# Patient Record
Sex: Female | Born: 1945 | Race: White | Hispanic: No | Smoking: Current every day smoker
Health system: Southern US, Community
[De-identification: ages and names within clinical notes are randomized; demographics above are authoritative.]

## PROBLEM LIST (undated history)

## (undated) ENCOUNTER — Emergency Department (HOSPITAL_COMMUNITY): Admission: EM | Payer: Medicare Other | Source: Home / Self Care

## (undated) DIAGNOSIS — E785 Hyperlipidemia, unspecified: Secondary | ICD-10-CM

## (undated) DIAGNOSIS — I10 Essential (primary) hypertension: Secondary | ICD-10-CM

## (undated) DIAGNOSIS — Z8572 Personal history of non-Hodgkin lymphomas: Secondary | ICD-10-CM

## (undated) DIAGNOSIS — I2510 Atherosclerotic heart disease of native coronary artery without angina pectoris: Secondary | ICD-10-CM

## (undated) DIAGNOSIS — M1990 Unspecified osteoarthritis, unspecified site: Secondary | ICD-10-CM

## (undated) DIAGNOSIS — I513 Intracardiac thrombosis, not elsewhere classified: Secondary | ICD-10-CM

## (undated) DIAGNOSIS — I509 Heart failure, unspecified: Secondary | ICD-10-CM

## (undated) HISTORY — DX: Hyperlipidemia, unspecified: E78.5

---

## 2001-02-01 ENCOUNTER — Ambulatory Visit (HOSPITAL_COMMUNITY): Admission: RE | Admit: 2001-02-01 | Discharge: 2001-02-01 | Payer: Self-pay | Admitting: Internal Medicine

## 2001-07-08 ENCOUNTER — Ambulatory Visit (HOSPITAL_COMMUNITY): Admission: RE | Admit: 2001-07-08 | Discharge: 2001-07-08 | Payer: Self-pay

## 2002-01-19 ENCOUNTER — Encounter (HOSPITAL_COMMUNITY): Admission: RE | Admit: 2002-01-19 | Discharge: 2002-02-18 | Payer: Self-pay | Admitting: Rheumatology

## 2002-01-19 ENCOUNTER — Encounter: Payer: Self-pay | Admitting: Rheumatology

## 2002-03-23 ENCOUNTER — Encounter (HOSPITAL_COMMUNITY): Admission: RE | Admit: 2002-03-23 | Discharge: 2002-04-22 | Payer: Self-pay | Admitting: Rheumatology

## 2002-06-15 ENCOUNTER — Encounter (HOSPITAL_COMMUNITY): Admission: RE | Admit: 2002-06-15 | Discharge: 2002-07-15 | Payer: Self-pay | Admitting: Rheumatology

## 2002-07-26 ENCOUNTER — Encounter (HOSPITAL_COMMUNITY): Admission: RE | Admit: 2002-07-26 | Discharge: 2002-08-25 | Payer: Self-pay | Admitting: Rheumatology

## 2002-07-26 ENCOUNTER — Encounter: Payer: Self-pay | Admitting: Rheumatology

## 2002-08-01 ENCOUNTER — Encounter: Payer: Self-pay | Admitting: Rheumatology

## 2002-08-07 ENCOUNTER — Ambulatory Visit (HOSPITAL_COMMUNITY): Admission: RE | Admit: 2002-08-07 | Discharge: 2002-08-07 | Payer: Self-pay

## 2002-08-14 ENCOUNTER — Encounter: Payer: Self-pay | Admitting: Specialist

## 2002-08-14 ENCOUNTER — Encounter: Admission: RE | Admit: 2002-08-14 | Discharge: 2002-08-14 | Payer: Self-pay | Admitting: Specialist

## 2002-10-05 ENCOUNTER — Encounter (HOSPITAL_COMMUNITY): Admission: RE | Admit: 2002-10-05 | Discharge: 2002-11-04 | Payer: Self-pay | Admitting: Rheumatology

## 2002-10-06 ENCOUNTER — Encounter: Payer: Self-pay | Admitting: Specialist

## 2002-10-11 ENCOUNTER — Encounter: Payer: Self-pay | Admitting: Specialist

## 2002-10-11 ENCOUNTER — Inpatient Hospital Stay (HOSPITAL_COMMUNITY): Admission: RE | Admit: 2002-10-11 | Discharge: 2002-10-18 | Payer: Self-pay | Admitting: Specialist

## 2002-10-15 ENCOUNTER — Encounter: Payer: Self-pay | Admitting: Specialist

## 2003-03-01 ENCOUNTER — Ambulatory Visit (HOSPITAL_COMMUNITY): Admission: RE | Admit: 2003-03-01 | Discharge: 2003-03-02 | Payer: Self-pay | Admitting: Specialist

## 2003-12-03 ENCOUNTER — Ambulatory Visit: Payer: Self-pay | Admitting: Anesthesiology

## 2004-01-22 ENCOUNTER — Ambulatory Visit: Payer: Self-pay | Admitting: Anesthesiology

## 2004-01-29 ENCOUNTER — Ambulatory Visit: Payer: Self-pay | Admitting: Anesthesiology

## 2004-01-31 ENCOUNTER — Ambulatory Visit: Payer: Self-pay | Admitting: Cardiology

## 2004-02-19 ENCOUNTER — Ambulatory Visit (HOSPITAL_COMMUNITY): Admission: RE | Admit: 2004-02-19 | Discharge: 2004-02-19 | Payer: Self-pay

## 2004-03-13 ENCOUNTER — Ambulatory Visit: Payer: Self-pay | Admitting: Cardiology

## 2004-03-26 ENCOUNTER — Ambulatory Visit: Payer: Self-pay | Admitting: Cardiology

## 2004-03-27 ENCOUNTER — Ambulatory Visit: Payer: Self-pay | Admitting: Anesthesiology

## 2004-04-29 ENCOUNTER — Ambulatory Visit: Payer: Self-pay | Admitting: Anesthesiology

## 2004-05-27 ENCOUNTER — Ambulatory Visit: Payer: Self-pay | Admitting: Anesthesiology

## 2004-07-22 ENCOUNTER — Ambulatory Visit: Payer: Self-pay | Admitting: Anesthesiology

## 2004-07-23 ENCOUNTER — Ambulatory Visit: Payer: Self-pay | Admitting: Anesthesiology

## 2004-08-21 ENCOUNTER — Ambulatory Visit: Payer: Self-pay | Admitting: Anesthesiology

## 2004-10-03 ENCOUNTER — Encounter: Admission: RE | Admit: 2004-10-03 | Discharge: 2004-10-03 | Payer: Self-pay | Admitting: Specialist

## 2004-10-08 ENCOUNTER — Ambulatory Visit: Payer: Self-pay | Admitting: Anesthesiology

## 2005-01-15 ENCOUNTER — Ambulatory Visit: Payer: Self-pay | Admitting: Internal Medicine

## 2005-02-19 ENCOUNTER — Ambulatory Visit (HOSPITAL_COMMUNITY): Admission: RE | Admit: 2005-02-19 | Discharge: 2005-02-19 | Payer: Self-pay | Admitting: Pulmonary Disease

## 2005-03-03 ENCOUNTER — Ambulatory Visit: Payer: Self-pay | Admitting: Internal Medicine

## 2005-04-10 ENCOUNTER — Ambulatory Visit: Payer: Self-pay | Admitting: Cardiology

## 2005-04-16 ENCOUNTER — Encounter: Admission: RE | Admit: 2005-04-16 | Discharge: 2005-04-16 | Payer: Self-pay | Admitting: Specialist

## 2005-05-21 ENCOUNTER — Ambulatory Visit (HOSPITAL_COMMUNITY): Admission: RE | Admit: 2005-05-21 | Discharge: 2005-05-21 | Payer: Self-pay | Admitting: Pulmonary Disease

## 2005-06-25 ENCOUNTER — Ambulatory Visit: Payer: Self-pay | Admitting: Internal Medicine

## 2006-03-05 ENCOUNTER — Ambulatory Visit: Payer: Self-pay | Admitting: Internal Medicine

## 2006-03-09 ENCOUNTER — Ambulatory Visit (HOSPITAL_COMMUNITY): Admission: RE | Admit: 2006-03-09 | Discharge: 2006-03-09 | Payer: Self-pay

## 2007-03-11 ENCOUNTER — Ambulatory Visit (HOSPITAL_COMMUNITY): Admission: RE | Admit: 2007-03-11 | Discharge: 2007-03-11 | Payer: Self-pay | Admitting: Pulmonary Disease

## 2009-03-26 ENCOUNTER — Ambulatory Visit (HOSPITAL_COMMUNITY): Admission: RE | Admit: 2009-03-26 | Discharge: 2009-03-26 | Payer: Self-pay | Admitting: Gynecology

## 2009-12-07 ENCOUNTER — Emergency Department (HOSPITAL_BASED_OUTPATIENT_CLINIC_OR_DEPARTMENT_OTHER)
Admission: EM | Admit: 2009-12-07 | Discharge: 2009-12-08 | Payer: Self-pay | Source: Home / Self Care | Admitting: Emergency Medicine

## 2009-12-08 ENCOUNTER — Ambulatory Visit: Payer: Self-pay | Admitting: Diagnostic Radiology

## 2010-03-28 ENCOUNTER — Ambulatory Visit (HOSPITAL_COMMUNITY): Admission: RE | Admit: 2010-03-28 | Payer: Self-pay | Source: Home / Self Care | Admitting: Gynecology

## 2010-04-02 ENCOUNTER — Encounter: Payer: Self-pay | Admitting: Gynecology
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
## 2010-07-18 NOTE — Consult Note (Signed)
NAME:  IDONNA, HEEREN NO.:  [id number]   MEDICAL RECORD NO.:  [id number]                   PATIENT TYPE:   LOCATION:                                       FACILITY:   PHYSICIAN:  Aundra Dubin, M.D.            DATE OF BIRTH:   DATE OF CONSULTATION:  DATE OF DISCHARGE:                                   CONSULTATION   CHIEF COMPLAINT:  Right hip and neck pain.   HISTORY:  Ms. Sami reports that over the last several weeks, she has  started to hurt a great deal in the right upper outer hip area.  This hurts  with walking and it is also tender to lay on it at night.  She continues to  hurt in her neck.  She finds that one or two Vicodin a day helps this a  great deal.  She uses it more on the days that she works.  She also uses the  Celebrex which helps.  I reviewed her x-rays which show a significant  arthritis at the C5-6-7 areas.  Her weight is up eight pounds.  There are no  swollen joints, URIs, fever, cough, or shortness of breath.   Her daughter has recently been in a severe MVA and is hospitalized at [facility].  She is going down to visit her essentially daily.   MEDICATIONS:  1. Celebrex 200 mg daily.  2. Vicodin 5 mg b.i.d.  3. Lipitor 10 mg daily.  4. Allegra-D daily.  5. Prozac 20 mg daily.  6. Flexeril 10 mg h.s.  7. Antacids.   PHYSICAL EXAMINATION:  VITAL SIGNS:  Weight 180 pounds, blood pressure  160/90, respirations 16.  GENERAL:  No distress.  LUNGS:  Clear.  NECK:  Negative JVD.  HEART:  Regular, no murmur.  EXTREMITIES:  Lower extremities no edema.  MUSCULOSKELETAL:  Hands, wrists, elbows, and shoulders, good range of  motion.  The neck has stiffness and decreased range of motion, but was not  very tender with this movement.  Trigger points around the shoulder, neck,  occiput, anterior chest, and upper paraspinous muscles were quite tender.  The right trochanteric bursa was tender.  Movement of the hip which  was  replaced was nontender.  Knees, ankles, and feet had good range of motion  and showed no active arthritis.   PROCEDURE:  Right trochanteric bursa injection.   DESCRIPTION OF PROCEDURE:  The skin was cleaned with alcohol and cooled with  ethyl chloride.  Then, 40 mg of Depo-Medrol and 1 mL of 2% lidocaine was  injected.    ASSESSMENT AND PLAN:  1. Neck pain.  She is stable with use of the Vicodin as above.  She is using     this entirely appropriately.  She will also continue with the Celebrex to     help the neck and hip pain.  2. Right trochanteric bursitis.  This has been injected as above.  3. Fibromyalgia.  She continues to use the Flexeril which helps with her     sleep.  4. She will return in four months.                                               Aundra Dubin, M.D.    WWT/MEDQ  D:  06/15/2002  T:  06/15/2002  Job:  [id number]   cc:   Ramon Dredge L. Juanetta Gosling, M.D.  [address]  [place]  [place] [address]  Fax: [phone]

## 2010-07-18 NOTE — Op Note (Signed)
NAMEJAQUELYNE, [name]                         ACCOUNT NO.:  [id number]   MEDICAL RECORD NO.:  [id number]                   PATIENT TYPE:  INP   LOCATION:  5035                                 FACILITY:  [facility]   PHYSICIAN:  [name], M.D.                DATE OF BIRTH:  04/22/1945   DATE OF PROCEDURE:  10/11/2002  DATE OF DISCHARGE:                                 OPERATIVE REPORT   PREOPERATIVE DIAGNOSIS:  Spondylolisthesis, degenerative type, L4-5, with  severe lumbar spinal stenosis, lumbar degenerative disk disease, L3-4.   POSTOPERATIVE DIAGNOSIS:  Spondylolisthesis, degenerative type, L4-5, with  severe lumbar spinal stenosis, lumbar degenerative disk disease, L3-4.   PROCEDURES:  1. Central laminectomy, L4-5 level, with removal of the spinous process of     L4 and central portions of the lamina of L4.  2. Right-sided facetectomy, decompression of bilateral L4 and L5 nerve     roots.  3. Right-side transforaminal intervertebral disk fusion utilizing a     Brantigan 10 mm radiolucent T-LIF cage with autogenous bone graft as well     as Symphony allograft.  4. Posterolateral fusion, L3 to L5, two levels, utilizing instrumentation     with the Monarch pedicle screws and rods, 75 mm rods used.  5. Right iliac crest bone graft harvested through a separate fascia incision     augmented with allograft bone graft material and Symphony platelet-rich     material.   SURGEON:  [name], M.D.   ASSISTANT:  [name], P.A.   ANESTHESIA:  Burna Forts, M.D.   ESTIMATED BLOOD LOSS:  500 mL.   DRAINS:  Foley to straight drain, Hemovac x1.   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  She has been  experiencing significant pain into her back, radiation into her legs,  worsening since February of this year.  She has a history of previous  hemiarthroplasty of the  hip on the right side.  She relates that she has  been experiencing severe worsening discomfort with ambulation, pain into  both legs, right leg greater than left.  She underwent evaluation with  determination of grade 1 degenerative spondylolisthesis at L4-5 with severe  central stenosis, bilateral lateral recess stenosis that could affect the L5  nerve root, moderate joint arthrosis changes with degenerative disk disease  at the L3-4 level.  Mild arthritis changes, L2-3.  The patient underwent  attempts at conservative management and as she has persistent pain,  discomfort, and increasing impairment in ambulation, she is brought to  undergo a central laminectomy, decompression, fusion to restore alignment of  the spine, inclusion of the L3-4 level, as this is showing degeneration as  well.   INTRAOPERATIVE FINDINGS:  Moderately severe lumbar spinal stenosis affecting  the lateral recesses bilateral at  the L4-5 level, affecting the L4 and L5  nerve roots.  Degenerative disk disease, L3-4.   DESCRIPTION OF PROCEDURE:  After adequate general anesthesia, the patient in  a prone position, chest rolls, all pressure points well-padded, a Foley  catheter in place.  Standard preoperative antibiotics, standard prep from  the lower dorsal spine to the midsacral level with Duraprep solution, draped  in the usual manner.  Iodine Vi-Drape was used.  The incision at the  expected L5 level extending superiorly to the expected L3 and L2 levels,  through the skin and subcutaneous layers after infiltration with Marcaine  0.5% with 1:200,000 epinephrine.  Incision carried sharply to the lumbar  dorsal fascia.  Bleeders controlled using electrocautery on the dorsal  fascia, incised in the midline, and then brought over the spinous process of  the expected L5, L4, L3, and L2 levels.  Cobbs then used to elevate the  paralumbar muscles off the posterior elements of the spinous process of L5,  L4, L3, and L2,  and over the posterior aspect of the interlaminar space at  L2-3, L3-4, L4-5.  Clamps were then placed over the spinous process of L4 to  L3.  Intraoperative lateral radiograph demonstrated these to be the same  levels.  A McCullough retractor was inserted and exposure then carried out  over the posterior aspects of the facets at the L4-5 level and L3-4 levels,  resecting the facet capsule at these levels while exposing the transverse  process of L5 and L4.  Exposure then obtained extracapsular at the L2-3  level, exposing the transverse process of L3 and preserving the facet  capsules at the L2-3 level.  The lateral gutters then debrided and muscle  tissue that was remnants of the paralumbar muscles here in order to allow  for the exposure of the transverse processes of L3, L4, and L5.  This was  done bilaterally and the areas were packed, bleeders controlled using  electrocautery.  A viper type of retractor then inserted and a Leksell  rongeur then used to remove the spinous process of L4 centrally and thin the  central portions of the lamina posteriorly at the L4 level.  This was saved  for later bone graft purposes.   Three millimeter and 4 mm Kerrison used to resect the remaining portions of  the central portion of the lamina from the L4-5 level to the L3-4 level.  The lateral recesses then decompressed, the ligamentum flavum excised, and  the medial aspect of the facet right L4, left L4-5.  The entire facet on the  right side was resected.  The inferior articular process, the superior  medial aspect of the L5 superior articular process.   Central lamina resected and the left lateral recess decompressed with medial  facetectomy over about 40% of the left L4-5 facet.  The left L4 nerve root  was decompressed, removing the reflected portion of the ligamentum flavum  and the superior aspect of the superior articular process of L5, decompressing the left L4 nerve root, the right L4 nerve  root similarly  decompressed out lateral.  The L5 nerve roots bilaterally decompressed with  foraminotomy performed over each of these nerve roots.  The central  laminectomy continued superiorly with excision of the ligamentum flavum at  the L3-4 level.   With this, then the fusion procedure was begun with placement of pedicle  screws at the expected L5, L4, and L3 levels.  This was done after observing  the intraoperative  radiographs, determining the alignment of the pedicles at  each level, L5, L4, and L3, then carefully exposing the intersection of the  lateral portion of the superior articular process of L5 with the transverse  process of L5, placing a small hole in the lateral aspect at the base of the  superior articular process at its intersection with the transverse process,  then inserting a pedicle finder probe, a blunt probe, and then probing the  channel within the pedicle on the right side.  Similarly this was done on  the left side at the L5 level and then at the L4-5 level bilaterally and at  the L3-4 level bilaterally.  Screws were placed on the right side after  probing the channels using ball-tip probe, then tapping appropriately.  Screws 6.5 were used with lengths determined after applying the ball-tip  probe and ensuring no evidence of penetration of cortical bone within the  pedicle or through the pedicle at each segment.  Forty millimeter screws  were used at the L5 level and 45 mm screws at the L4 level, 45 mm screws at  the L3 level.   Tapping performed, decortication of the transverse process, bone graft that  had been harvested from both the right iliac crest and from the posterior  elements was then used to bone-graft the transverse processes and the screws  then placed.  Screws were similarly placed on the left side.  C-arm  fluoroscopy was used to ascertain correct position and alignment.  The [employer] soft tissue resistance was also measured at each  screw and measured  40 milliamps at each segment with the exception of the left L4, which  measured 36.   Rods 75 mm were then placed into each of the fasteners and the screws  following breakage of the locking mechanism and a temporary locking  mechanism using a fastener block breaker.  Seventy-five millimeter length  rods placed and then the caps applied to the fasteners at each level.  The  uppermost segment was to be posterolaterally fused without a T-LIF, was  locked into place by carefully fastening the rod and aligning it with the  fasteners in place and then performing tightening of the screw posteriorly  to 100 foot pounds.  The torque wrench was used for this and applied quite  nicely.  This was also done at the L4 level, basically locking into place  the L4 and L3 segments.  Distraction was then obtained between the L4 and L5  pedicle screws and the screw at L5 and locked into place to allow for distraction during the T-LIF portion of the procedure.  The right posterior  disk at the L4-5 level was then evaluated and examined using a D'Errico  retractor to retract the right lateral thecal sac and the right L4 nerve  root.  A 15 blade scalpel was used to excise a window of disk over the  posterior annulus on the right side about a centimeter in length,  approximately 4 mm in width.  A pituitary rongeur was used to debride the  disk space and loose annulus and nucleus pulposus material.  The end plates  were then decorticated using the curved curettes as well as regular  curettes, osteotomes, and the ring curettes.  This was debrided back to  bleeding end plate bone surfaces.  Loupe magnification was used throughout  the case.  The decortication performed and bone graft was harvested from the  right iliac crest in a  separate fashion, an incision subcutaneous to the  muscle and fascia layer on the right side to the right iliac crest, lifting  the skin appropriately, and this was a  rather deep tunnel to the iliac  crest.  An incision made over the iliac crest using electrocautery,  subperiosteal dissection carried medial and lateral.  Osteotome then used to  remove cortical and cancellous bone strips, then gouges were used to remove  inner table cancellous strip, enough for a two-layer fusion as was possible.  This was augmented with allograft material.  Following removal of bone  graft, Gelfoam thrombin-soaked was placed into the right posterior iliac  crest bone graft harvest site.  The graft site left open in case more graft  was necessary.  The expected cages were then sounded, an expected 10 mm cage  provided the best fit on C-arm fluoroscopic evaluation, and this was chosen.  The 10 mm T-LIF Brantigan cage, radiolucent, was packed with cancellous bone  graft material obtained from the right iliac crest, packed into good  position and alignment.  The T-LIF cage with the graft packed in place was  then impacted into place after first filling the bone graft in the  interspace posteriorly using a fair amount of Symphony combination of  autograft and allograft material as well as bone graft that was left over  and cancellous bone from the right iliac crest.  The cage was then impacted  in place and then tipped into a position in the coronal plane of the disk  space.  Care taken to ensure that bony debris was not loose within the canal  or into the foramen of the right L4 nerve root, and excess bone graft was  then removed posteriorly.  A hockey stick neuro probe could be passed down  both L4 neural foramina and L5 neural foramina.   The cap fasteners were then loosened at the L5 level and compression  obtained across this level and the screw then fastened to the rod using 100  foot pound torque.  Care was taken to ensure that the screw fastener caps  were adequately placed and in good position and alignment prior to this. With this, then the Symphony bone graft  material remaining, Symphony logs  were then placed posterolaterally, extending from the transverse process of  L3 and L4 into L5.  Decortication of the facets of the L4-5 level and L3-4  level on the left side was carried out, on the right side at the L3-4 level,  and these were bone-grafted using cancellous bone graft as well as allograft  material.  Irrigation was performed.  Bleeders over the right side of the  spinal canal on L4-5 were controlled using bipolar electrocautery as well as  Surgicel and Gelfoam.  The Cell Saver was used throughout the case, a total  of about 150 mL of blood was returned to the patient.  Bleeding was felt to  be adequately hemostased.  At this point the platelet-rich layer of plasma  was then carefully spread over the incision site following irrigation, and  irrigation of the right iliac crest bone graft harvest, and this too was  sprayed with the platelet-rich plasma solution.  A Hemovac drain placed in  the central incision as well as right iliac crest.  Right iliac crest bone  graft harvest site closed with a running stitch of #1 Vicryl.  Midline  incision closed by reapproximating the lumbodorsal fascia to the spinous  process of L3  superiorly and to L5 inferiorly and then reapproximating the  paralumbar muscle loosely in the midline using interrupted #1 Vicryl  sutures.  Then the dorsal fascia approximated with interrupted #1 Vicryl  suture, the deep subcu layers approximated with interrupted #1 and 0 Vicryl  sutures, more superficial layers with interrupted 2-0 Vicryl sutures, and  the skin closed with a running subcu stitch of 4-0 Vicryl.  Tincture of  Benzoin and Steri-Strips  applied.  Four by fours, ABD pad affixed to the skin with Hypafix tape.  The  patient was then returned to a supine position, reactivated, extubated, and  returned to the recovery room in satisfactory condition.  All instrument and  sponge counts were correct.                                                [name], M.D.    JEN/MEDQ  D:  10/11/2002  T:  10/12/2002  Job:  [id number]

## 2010-07-18 NOTE — Consult Note (Signed)
NAME:  [name], [name] NO.:  [id number]   MEDICAL RECORD NO.:  [id number]                   PATIENT TYPE:   LOCATION:                                       FACILITY:   PHYSICIAN:  Aundra Dubin, M.D.            DATE OF BIRTH:   DATE OF CONSULTATION:  03/23/2002  DATE OF DISCHARGE:                                   CONSULTATION   CHIEF COMPLAINT:  Neck pain, hip pain.   HISTORY OF PRESENT ILLNESS:  The patient returns with her multiple  difficulties for evaluation.  She has been going to physical therapy for her  neck and the heat and range of motion has helped some.  She is having some  pain across the top of her shoulder that seems to be new over the last week.  The cervical spine x-ray of January 19, 2002 showed considerable  degenerative spurring and disk space loss between C5-6 and C6-7.  There were  also other changes.  The thoracic spine showed mild degenerative-type  changes and no compression fractures.  She has used the Effexor but finds  that this is no better than the Prozac.  She did see Dr. Montez Morita concerning  her right hip replacement and pain she was having.  She did stop the  exercises she was doing for this area and the thigh pain has reduced.  She  had the hip x-rayed which showed no significant loosening or need for  surgery.  She is now on Celebrex and this has helped this area and her neck.   MEDICATIONS:  1. Effexor 75 mg daily.  2. Celebrex 200 mg daily.  3. Vicodin 5 mg b.i.d.  4. Xanax 0.5 mg b.i.d.  5. Toprol XL 25 mg daily.  6. Lipitor 10 mg daily.  7. Allegra-D daily.   PHYSICAL EXAMINATION:  VITAL SIGNS:  Weight 172 pounds, blood pressure  154/90, respirations 14.  GENERAL:  She appears well.  LUNGS:  Clear.  HEART:  Regular.  No murmur.  MUSCULOSKELETAL:  Hands wrists, elbows, shoulders have a good range of  motion.  There are no tendonitis signs to the left shoulder.  The neck  remains tender with range  of motion which is mildly limited  Trigger points  around the shoulder and neck have moderate tenderness.  Hips:  Good range of  motion.  Knees are cool and flex well to 130 degrees.  Feet are nontender.   ASSESSMENT AND PLAN:  1. Neck pain.  She will continue for one month further with physical     therapy.  She will then continue at home with exercises.  I have     continued her on the Vicodin 5/500 mg one b.i.d.  She is using this     appropriately.  2. Depression.  Since the Effexor has not helped, I am having her stop this     for three days  and she can return to the     Prozac.  3. Right hip pain.  This is better understood after she has been evaluated     by Dr. Montez Morita.   She will return in three months.                                               Aundra Dubin, M.D.    WWT/MEDQ  D:  03/23/2002  T:  03/24/2002  Job:  [id number]   cc:   Ramon Dredge L. Juanetta Gosling, M.D.  [address]  [place]  [place] [address]  Fax: [phone]

## 2010-07-18 NOTE — Op Note (Signed)
NAMELAKEENA, [name]                         ACCOUNT NO.:  [id number]   MEDICAL RECORD NO.:  [id number]                   PATIENT TYPE:  OIB   LOCATION:  2550                                 FACILITY:  [facility]   PHYSICIAN:  [name], M.D.                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   PROCEDURE:  Anterior cervical diskectomy and fusion at C5-6 and C6-7 with  right iliac crest bone graft harvested through a separate incision.  A Dupuy  6-hole locking plate with screws measuring 13 mm in length at C5 and C7  levels and 12 mm in length at the C6 level.   SURGEON:  [name], M.D.   ASSISTANT:  [name], P.A.-C.   ANESTHESIA:  GOT, Dr. Jean Rosenthal.   ESTIMATED BLOOD LOSS:  150 cc.   DRAINS:  A #10 French TLS drain left neck, Foley to straight drain.   BRIEF CLINICAL HISTORY:  The patient is a 65 year old female who has  undergone previous two-level lumbar decompression and fusion for problems of  spondylolisthesis, degenerative disk disease.  Following this, she has had  problems with increasing neck pain and radiation into her shoulders and  arms.  She has positive Hoffman sign and MRI of the cervical spine  demonstrating narrowed cervical canal at the C5-6 level of 6 mm and 7 mm at  the C6-7 level.  MRI study also suggests that there is low level cord edema  or myelomalacia present and this seems to be most significant at the C5-6  level.  Bilateral severe foraminal narrowing entrapment left side greater  than right at the C6-7 level.   INTRAOPERATIVE FINDINGS:  The patient was found to have ossification of  the  posterior longitudinal ligament within the very central portions of the  cervical canal effecting the size and diameter of the cervical canal.  Bilateral foraminal entrapment at the C6-7 level.  Following decompression,  the central portions of the canal appeared to be well decompressed as did  the foramen bilaterally at the C6-7 level and C5-6 levels.   DESCRIPTION OF PROCEDURE:  After adequate general anesthesia, the patient in  a beach chair position, the neck in slight cervical traction and slight  extension traction with the halter 5 pounds.  A bump under the right  buttock.  A standard prep anterior and left neck and right iliac crest with  Duraprep solution.  Draped in the usual manner.  An iodine Vi-Drape was  placed over the neck and right iliac crest.  Prior to Vi-Drape being placed  and prior to prep, the skin was marked in lines over the patient's skin  creases for the incision.  The incisions on the left neck two fingerbreadths  above the medial clavicle in line with the skin creases on this side.  Through the skin and subcutaneous layers using a #10 blade scalpel after  infiltration with Marcaine 0.5%, 1:200,000 in 5 cc.  The platysma layer then  incised in line with the skin incision.  The pretracheal fascial layers then  carefully spread using the Metzenbaum scissors.  The interval between the  trachea and esophagus medially and the carotid sheath laterally then  developed.  The omohyoid muscle identified, localized and divided with  Metzenbaum scissors.  [employer], then hand-held Cloward used to retract the  esophagus and trachea medially.  Exposing the anterior aspect of the  cervical spine and prevertebral fascial layer here.  Bipolar electrocautery  used to cauterize the prevertebral fascia along the medial border and longus  colli muscle and this was then teased across the midline using Scientist, forensic as well as Art therapist.  The expected C5-6 and C6-7 levels  were  then marked by placement of spinal needles leaving the spinal needle sheath  intact, inserting them about a centimeter into the disk space.  Intraoperative lateral radiograph demonstrated these to be the C5-6 and C6-7  levels.  The needle was just below each of those disk spaces and their large  osteophytes.  The medial border of the longus colli muscle was then  carefully developed using electrocautery as well as Art therapist.  Bose and  McCullough retractor were then inserted, the foot lay beneath the medial  border of the longus colli muscle.  High speed bur then used to carefully  remove the anterior lip osteophytes at both C5-6 and C6-7 down to the  anterior portion of the anterior cortex of the midportion of the vertebral  body at all levels of C5, C6 and C7.  A #15 blade scalpel then used to  incise the disk at C5-6 and C6-7 removing anterior annulus at C6-7.  A 14-mm  screw post inserted in the anterior aspect of the vertebral body of C6 and  C7.  Distraction obtained across the disk space.  High speed bur using a  cutting bur 3.5 mm in diameter was then used to remove the anterior lip  osteophytes and remove the end plates at the Z6-1 level along with the disk  intervening.  This was carried back to the posterior lip osteophytes and the  posterior aspect of the disk space using loupe magnification and headlight.  Operating room microscope and drape brought into the field.  Under direct  visualization with the microscope and the high speed bur was used to thin  the posterior lip osteophytes to less than a millimeter for resection.  A 1-  mm Kerrison was then introduced over the left side into the lateral recess  and then used to carefully resect the posterior lip osteophytes first  inferiorly caudal to the lip of the osteophyte breaking off the osteophytes  as they were removed and floating them away from the cord.  Once this was completed, then a posterior-inferior lip  osteophyte of C6 was then similarly  resected, resecting in an area just above the area of narrowed canal.  Bony  ligament that was attached to this area was resected using pituitary  rongeurs as well as 2-mm Kerrison to lift this off the anterior aspect of  the thecal  sac.  Following removal of this debris from the posterior aspect  of the disk space, the spinal canal was felt to be well decompressed  centrally.  One and 2-mm Kerrisons were then used to perform partial  resection of the uncovertebral joints and foraminotomy bilaterally of the C6-  7 level until the C7 nerve root was felt to be exiting without further  compression.  The height of the intervertebral disk space was measured and a  #8 Dupuy trial seemed to give the best fit, so the 8-mm dual oscillating  blade was used to make the initial cut on the right iliac crest.  Incision  on the right iliac crest about 2-3 inches posterior to the anterior superior  iliac spine roughly 2.5 to 3 inches in length through the skin and  subcutaneous layer directly down to the iliac crest was done  anterolaterally.  Electrocautery used to incise onto the crest  superolaterally and then subperiosteally dissection carried medial and  lateral.  Hand-held Cloward placed over the medial aspect of the crest and  [employer] lateral and the Casper dual oscillating saw was then used with an  8-mm blade to cut into the crest.  Cloward depth gauge was used to ascertain  the depth of the intervertebral disk space at 16 mm.  Initial cut of 8 mm,  this was then trimmed to a depth of approximately 12-14 mm.  The edges were  then carefully tapered to dimensions of the intervertebral disk space.  This  graft then placed over the disk space and appeared to be too small.  So a  second cut was then made on the right iliac crest using a 9-mm dual  oscillating blade with TPS system.  This cut across the base, again measured  for depth and measured 14 mm.  Carefully  tapered to dimensions of the  intervertebral disk space and then placed over the disk space.  Care taken  to ensure no soft tissue remaining within the disk space and this was then  impacted into place without difficulty.  Screw posts were removed from the  C7 vertebral body and then bone wax applied, leaving the screw post hole to  obtain hemostasis.  The 14-mm screw post then inserted at the C5 level and  distraction obtained here.  High speed bur then used to remove the anterior  lip osteophytes and both end plates over the inferior aspect of C5, superior  aspect of C6 back to the posterior lip osteophytes here.  The operating room  microscope brought into the field and under direct visualization the  posterior lip osteophytes were resected burring to an area just inferior and  superior to the lip osteophytes.  Then using 1-mm Kerrison entering into the  lateral recess and then beneath the posterior lip osteophytes resecting the bone over the posterior aspect of the vertebral body just caudal to the area  of most significant stenosis and cranial to this area as well.  Osteophytes  were then resected using pituitary rongeur off of the thecal sac.  Two-  millimeter and 1-mm Kerrison used to resect osteophyte posterior  longitudinal ligament remaining.  Foraminotomies performed over the right C6  and left C6 nerve roots using 1- and 2-mm Kerrisons until these nerve roots  were felt to be exiting without evidence of compression.  Irrigation was  performed.  Hemostasis using thrombin-soaked Gelfoam.  The height of the  intervertebral disk space measured at about 8 mm.  The 8-mm graft  cut  previously was then carefully tapered to dimensions of the intervertebral  disk space, inserted and impacted into place.  This gave an excellent fit.  Five-pound cervical halter traction was then released.   A high-speed bur used to remove anterior lip osteophytes at C5-6 and C6-7  and smoothed this area to  allow for acceptance of the cervical plate.  The  cervical plate itself was contoured with a little bit more lordosis than was  on the regular plate.  This was done carefully so that the plate would  adhere to the anterior aspect of the cervical spine from C5 to C7.  The  plate then placed in the midline and pinned into place superiorly and  inferiorly.  Drill holes were then placed at the C5 level, first on the  midline and then the left side.  It was  noted the plate was too lateral  left side so the drill holes and screws were felt to be too much to the left  side.  The screws were removed.  The plate then replaced to the right side  and centrally located.  The bone graft was placed to the screw holes that  had been made.  The plate then fastened into place using temporary fixation  pins.  The screw holes then placed at the C5 level using a 12-mm drill bit  and 13-mm screws were placed fixing the plate nicely at the C5.  The 12-mm  screws were placed to the C6 level, again obtaining excellent purchase.  Then at the C7 level drill holes were placed.  A 13-mm screw placed on the  right side.  On the left side, a 13-mm regular screw did not provide enough  purchase.  A 12-mm screw was placed on the left side obtaining excellent  purchase.  The plate appeared to be in excellent position and alignment.  Irrigation was performed.  Small areas of bleeding muscle longus coli on the  left lower side.  This was cauterized using bipolar electrocautery.  Esophagus examined demonstrating no abnormality.  Intraoperative radiograph  and lateral radiograph demonstrated plates and screws in good position and  alignment without retropulsion of graft material or of screws.  A 10-French  drain then placed in the depth of the incision exiting over the inferior  aspect of the patient's incision scar and sewn in place with a 4-0 nylon  stitch.  The platysmal layer reapproximated with interrupted 3-0  Vicryl sutures.  Deep subcutaneous layers with interrupted 3-0 Vicryl suture and  the skin closed with a running 4-0 subcuticular stitch.  The right iliac  crest bone graft harvest site irrigated with copious amounts of irrigant  solution.  Bone wax applied to the bleeding cancellous bone surfaces and  then Gelfoam.  The fascial layers over the abdomen and upper thigh then  reapproximated in the midline with interrupted #1 Vicryl sutures.  Deep  subcutaneous layers reapproximated with interrupted #1 and 0-Vicryl sutures.  More superficial layers with interrupted 2-0 Vicryl sutures.  Skin closed  with a running subcutaneous. stitch of 4-0 Vicryl.  Tincture of Benzoin and  Steri-Strips applied here.  The TLS drain was then charged.  The patient had  application of dressings, 4 x 4s fixed to the skin with Hypafix tape over  the right iliac crest and left neck.  The patient had application of  [place] collar.  Foley catheter was then placed and 300 cc of urine  were found to be present.  The catheter was left in place then at that time.  The patient was then reactivated, extubated, returned to her bed and the  recovery room in satisfactory condition.                                               [name], M.D.    Myra Rude  D:  03/01/2003  T:  03/01/2003  Job:  [id number]
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
## 2010-07-18 NOTE — Discharge Summary (Signed)
NAMEADDASYN, MCBREEN                         ACCOUNT NO.:  [id number]   MEDICAL RECORD NO.:  [id number]                   PATIENT TYPE:  INP   LOCATION:  5035                                 FACILITY:  [facility]   PHYSICIAN:  [name], P.A.              DATE OF BIRTH:  23-Jun-1945   DATE OF ADMISSION:  10/11/2002  DATE OF DISCHARGE:  10/18/2002                                 DISCHARGE SUMMARY   ADMISSION DIAGNOSES:  1. Spondylolisthesis, degenerative type, L4, with severe lumbar spinal     stenosis and lumbar degenerative disk disease at L3-4.  2. Fibromyalgia.  3. Anxiety and depression.  4. Hypertension.  5. Gastrointestinal reflux disease with occasional gastritis.  6. Chronic constipation.  7. Status post right hip hemiarthroplasty.  8. Recent diagnosis of hematuria being evaluated on admission by urologist.     No infection noted by evaluation.   DISCHARGE DIAGNOSES:  1. Spondylolisthesis, degenerative type, L4, with severe lumbar spinal     stenosis and lumbar degenerative disk disease at L3-4.  2. Fibromyalgia.  3. Anxiety and depression.  4. Hypertension.  5. Gastrointestinal reflux disease with occasional gastritis.  6. Chronic constipation.  7. Status post right hip hemiarthroplasty.  8. Recent diagnosis of hematuria being evaluated on admission by urologist.     No infection noted by evaluation.  9. Postoperative oversedation secondary to analgesics, resolved at     discharge.  10.      Post hemorrhagic anemia.  11.      Hypokalemia.  12.      Postoperative atelectasis, resolved at discharge.   PROCEDURES:  On October 11, 2002, the patient underwent:  1. Central laminectomy, L4-5, with removal of spinous process of L4 and     central portions of the lamina of L4.  2. Right-sided facetectomy and decompression of bilateral L4 and L5 nerve     roots.  3. Right-sided transforaminal intervertebral disk fusion utilizing Brantigan     cages with autogenous  bone graft, as well as Symphony allograft.  4. Posterolateral fusion, L3 to L5, utilizing instrumentation with Monarch     medical screws and rods.  5. Right iliac crest bone graft harvested through a separate fascial     incision augmented with allograft bone graft material and Symphony     platelet-rich material.   This was performed by Dr. Otelia Sergeant assisted by [name], P.A., under  general anesthesia.   CONSULTATIONS:  Ellwood Dense, M.D., of physical medicine and  rehabilitation.   BRIEF HISTORY:  The patient is a 65 year old white female with significant  low back pain with radiation into bilateral lower extremities for several  months.  She has worsening of her discomfort with ambulation and the pain is  in the bilateral lower extremities, right greater than left.  X-ray  evaluation determined a grade 1 degenerative spondylolisthesis at L4-5.  MRI  studies have demonstrated  severe stenosis at that level, as well as  bilateral lateral recess stenosis that effects the L5 nerve root.  Moderate  joint arthrosis changes and degenerative disk disease at the L3 level were  also noted with mild arthritic changes at the L2-3 level noted.  The patient  has failed conservative treatment and now has persistent pain requiring  narcotic analgesics.  She also is impaired with ambulation.  She is a Engineer, civil (consulting)  and works for [employer] and  is having difficulty maintaining the level of function necessary to do her  job.  It was felt that she would require surgical intervention and was  admitted for the procedure as stated above.   BRIEF HOSPITAL COURSE:  Postoperatively the patient had significant  discomfort and required PCA analgesics augmented with oral analgesics.  Over  a 12-hour period, she became somewhat oversedated.  She required  discontinuation of multiple medications and oral agents only were started.  Over the next couple of days, she  continued to be somewhat sedated and had  difficulty participating with physical therapy secondary to oversedation.  At that time, she was placed on Lovenox for DVT prophylaxis as she was not  progressing very well with her activity level.  Physical therapy and  occupational therapy did see her daily.  She was slow to begin activity and  was only able to tolerated bed-to-chair transfers initially.  Occupational  therapy assisted with ADLs, but the patient did require some assistance  until her sedation resolved.  A rehabilitation consult was obtained  secondary to the fact that the patient was making very slow progress.  She  was felt to be a suitable candidate if she did not make any progress once  her oversedation had resolved.  Fortunately with the assistance of changing  her medications from IV to p.o., as well as discontinuing the use of Xanax  and other sedating medications, she was able to begin to increase her  activity.  The patient was noted to have some elevated fevers as high as 102  degrees.  This was felt to be related to atelectasis and she responded well  to incentive spirometry, as well as coughing and deep breathing.  Once she  began being out of bed and ambulating, the patient was noted to have  lessening of these fevers.  She had some mild hypoxia with O2 saturations at  94% on room air, which resolved as well.  Chest x-ray and urinalysis were  both negative for any infectious process.  The patient was very slow to  progress with a bowel movement as she did have significant chronic  constipation.  She required enemas to have an adequate bowel movement.  The  patient was noted to have hypokalemia, which was treated with oral  supplementations.  Values did improve with this treatment.  Wound checks  were done daily and the wound was found to be healing well without erythema or edema.  Neurovascular and motor function of the lower extremities  remained intact throughout the  hospital stay.  The patient did require a  rolling walker for ambulation and was able to ambulate in the hallway with  the assistance of a physical therapist prior to discharge.  Eventually she  was independent with activities of daily living as well.  The patient was  voiding well.  She was noted to have perineal area of ulceration on the  sixth postoperative day.  This was examined and felt to  be an inflamed hair  follicle which had erupted and had some bleeding.  No infectious process was  noted.  This was treated with local wound care.  The patient did have a drop  in history and physical postoperatively, but did not require blood  transfusion.  On the seventh postoperative day, she was noted to be afebrile  with vital signs stable.  Her pain was well controlled with OxyContin and  Oxy-IR.  She had no nausea and was voiding well and also having bowel  movements.  It was felt that she was stable with transfer to her home with  arrangements made for full assistance by [facility].   PERTINENT LABORATORY VALUES:  CBC on admission with hemoglobin and  hematocrit 13.5 and 39.8, respectively.  The hemoglobin dropped to the  lowest value of 8.1 with a hematocrit of 23.0 and after transfusion returned  to 10.5 and 30.1 respectively.  Chemistry studies on admission were within  normal limits with the exception of total protein mildly decreased at 5.9.  Postoperative BMETs were monitored daily and the patient was noted to have  mild hypokalemia treated with oral supplementations which improved.  At  discharge, the potassium was noted to 3.3.  The patient had mildly elevated  glucoses while on IV fluids which normalized with discontinuation of the  fluids.  The calcium was low and at discharge noted to 8.1.  The urinalysis  on admission, repeat on October 13, 2002, and repeat on October 15, 2002, all  were negative for urinary tract infection.  Urine culture showed no growth.  There is  no chest x-ray or EKG on this chart for this dictation purpose.   PLAN:  The patient was discharged to her home.  She does have family that  will be with her 24 hours a day.  She will wear her brace when she is out of  bed.  She was taught by the physical therapist how to don and doff the brace  at bedside and tolerated this well during the hospital stay.  She will  continue to wear the brace anytime she is walking.  She will utilize a  rolling walker for ambulation.  [facility] will provide home  health physical therapy for her.  She will resume all of her home medication  and is given prescriptions for OxyContin 40 mg b.i.d., #60, and Oxy-IR one  every four to six hours as needed for breakthrough pain, #60.  She will also  continue to use over the counter stool softeners and laxatives as needed as  long as she is taking pain medication.  Her Lovenox was discontinued at discharge and she will not utilize aspirin at home.  Dressing change will be  done daily at home.  She will be allowed to shower.  She will follow up with  Dr. Otelia Sergeant in 10-14 days and has been advised to call the office to arrange  the appointment.  Should she have questions or concerns prior to her return  office visit, she has been encouraged to call.   CONDITION ON DISCHARGE:  Stable.                                                [name], P.A.    SMV/MEDQ  D:  11/16/2002  T:  11/18/2002  Job:  [id number]  

## 2010-08-19 ENCOUNTER — Ambulatory Visit (INDEPENDENT_AMBULATORY_CARE_PROVIDER_SITE_OTHER): Payer: Medicare Other | Admitting: Internal Medicine

## 2010-08-19 DIAGNOSIS — K5900 Constipation, unspecified: Secondary | ICD-10-CM

## 2011-05-27 ENCOUNTER — Ambulatory Visit: Payer: Medicare Other | Attending: Physical Medicine and Rehabilitation | Admitting: Rehabilitation

## 2011-05-27 DIAGNOSIS — M6281 Muscle weakness (generalized): Secondary | ICD-10-CM | POA: Insufficient documentation

## 2011-05-27 DIAGNOSIS — M25519 Pain in unspecified shoulder: Secondary | ICD-10-CM | POA: Insufficient documentation

## 2011-05-27 DIAGNOSIS — IMO0001 Reserved for inherently not codable concepts without codable children: Secondary | ICD-10-CM | POA: Insufficient documentation

## 2011-06-09 ENCOUNTER — Ambulatory Visit: Payer: Medicare Other | Admitting: Physical Therapy

## 2011-06-09 ENCOUNTER — Ambulatory Visit: Payer: Medicare Other | Attending: Physical Medicine and Rehabilitation | Admitting: Physical Therapy

## 2011-06-09 DIAGNOSIS — M6281 Muscle weakness (generalized): Secondary | ICD-10-CM | POA: Insufficient documentation

## 2011-06-09 DIAGNOSIS — IMO0001 Reserved for inherently not codable concepts without codable children: Secondary | ICD-10-CM | POA: Insufficient documentation

## 2011-06-09 DIAGNOSIS — M25519 Pain in unspecified shoulder: Secondary | ICD-10-CM | POA: Insufficient documentation

## 2011-08-05 ENCOUNTER — Encounter (INDEPENDENT_AMBULATORY_CARE_PROVIDER_SITE_OTHER): Payer: Self-pay | Admitting: *Deleted

## 2011-08-17 ENCOUNTER — Ambulatory Visit (INDEPENDENT_AMBULATORY_CARE_PROVIDER_SITE_OTHER): Payer: MEDICARE | Admitting: Internal Medicine

## 2014-02-05 ENCOUNTER — Encounter (INDEPENDENT_AMBULATORY_CARE_PROVIDER_SITE_OTHER): Payer: Self-pay | Admitting: *Deleted

## 2014-08-21 DIAGNOSIS — C8205 Follicular lymphoma grade I, lymph nodes of inguinal region and lower limb: Secondary | ICD-10-CM | POA: Insufficient documentation

## 2014-12-18 DIAGNOSIS — R059 Cough, unspecified: Secondary | ICD-10-CM | POA: Insufficient documentation

## 2015-04-30 DIAGNOSIS — F17200 Nicotine dependence, unspecified, uncomplicated: Secondary | ICD-10-CM | POA: Insufficient documentation

## 2015-04-30 DIAGNOSIS — I10 Essential (primary) hypertension: Secondary | ICD-10-CM | POA: Insufficient documentation

## 2015-04-30 DIAGNOSIS — R911 Solitary pulmonary nodule: Secondary | ICD-10-CM | POA: Insufficient documentation

## 2015-04-30 DIAGNOSIS — M4800 Spinal stenosis, site unspecified: Secondary | ICD-10-CM | POA: Insufficient documentation

## 2015-04-30 DIAGNOSIS — I6523 Occlusion and stenosis of bilateral carotid arteries: Secondary | ICD-10-CM | POA: Insufficient documentation

## 2015-04-30 DIAGNOSIS — E782 Mixed hyperlipidemia: Secondary | ICD-10-CM | POA: Insufficient documentation

## 2015-04-30 DIAGNOSIS — M159 Polyosteoarthritis, unspecified: Secondary | ICD-10-CM | POA: Insufficient documentation

## 2015-04-30 DIAGNOSIS — E785 Hyperlipidemia, unspecified: Secondary | ICD-10-CM | POA: Insufficient documentation

## 2015-04-30 DIAGNOSIS — F3341 Major depressive disorder, recurrent, in partial remission: Secondary | ICD-10-CM | POA: Insufficient documentation

## 2015-04-30 DIAGNOSIS — I25810 Atherosclerosis of coronary artery bypass graft(s) without angina pectoris: Secondary | ICD-10-CM | POA: Insufficient documentation

## 2015-04-30 DIAGNOSIS — F411 Generalized anxiety disorder: Secondary | ICD-10-CM | POA: Insufficient documentation

## 2016-05-07 ENCOUNTER — Ambulatory Visit (INDEPENDENT_AMBULATORY_CARE_PROVIDER_SITE_OTHER): Payer: Self-pay | Admitting: Specialist

## 2017-05-10 DIAGNOSIS — G894 Chronic pain syndrome: Secondary | ICD-10-CM

## 2017-05-10 DIAGNOSIS — M5136 Other intervertebral disc degeneration, lumbar region: Secondary | ICD-10-CM | POA: Insufficient documentation

## 2017-05-10 DIAGNOSIS — Z79891 Long term (current) use of opiate analgesic: Secondary | ICD-10-CM | POA: Insufficient documentation

## 2017-05-10 HISTORY — DX: Chronic pain syndrome: G89.4

## 2017-07-21 DIAGNOSIS — M1712 Unilateral primary osteoarthritis, left knee: Secondary | ICD-10-CM | POA: Insufficient documentation

## 2018-08-24 DIAGNOSIS — R4182 Altered mental status, unspecified: Secondary | ICD-10-CM | POA: Insufficient documentation

## 2018-12-21 DIAGNOSIS — R7303 Prediabetes: Secondary | ICD-10-CM | POA: Insufficient documentation

## 2018-12-21 DIAGNOSIS — E663 Overweight: Secondary | ICD-10-CM | POA: Insufficient documentation

## 2019-02-01 DIAGNOSIS — T402X5A Adverse effect of other opioids, initial encounter: Secondary | ICD-10-CM | POA: Insufficient documentation

## 2019-02-01 DIAGNOSIS — K5903 Drug induced constipation: Secondary | ICD-10-CM | POA: Insufficient documentation

## 2019-02-01 DIAGNOSIS — R195 Other fecal abnormalities: Secondary | ICD-10-CM | POA: Insufficient documentation

## 2019-02-09 DIAGNOSIS — K5909 Other constipation: Secondary | ICD-10-CM | POA: Insufficient documentation

## 2020-09-07 DIAGNOSIS — M961 Postlaminectomy syndrome, not elsewhere classified: Secondary | ICD-10-CM | POA: Insufficient documentation

## 2020-11-13 ENCOUNTER — Ambulatory Visit (INDEPENDENT_AMBULATORY_CARE_PROVIDER_SITE_OTHER): Payer: Medicare Other

## 2020-11-13 ENCOUNTER — Ambulatory Visit: Payer: Self-pay

## 2020-11-13 ENCOUNTER — Ambulatory Visit (INDEPENDENT_AMBULATORY_CARE_PROVIDER_SITE_OTHER): Payer: Medicare Other | Admitting: Specialist

## 2020-11-13 ENCOUNTER — Encounter: Payer: Self-pay | Admitting: Specialist

## 2020-11-13 ENCOUNTER — Other Ambulatory Visit: Payer: Self-pay

## 2020-11-13 VITALS — BP 177/78 | HR 52 | Ht 63.0 in | Wt 156.0 lb

## 2020-11-13 DIAGNOSIS — M542 Cervicalgia: Secondary | ICD-10-CM | POA: Diagnosis not present

## 2020-11-13 DIAGNOSIS — R29818 Other symptoms and signs involving the nervous system: Secondary | ICD-10-CM

## 2020-11-13 DIAGNOSIS — M545 Low back pain, unspecified: Secondary | ICD-10-CM

## 2020-11-13 DIAGNOSIS — Z981 Arthrodesis status: Secondary | ICD-10-CM

## 2020-11-13 DIAGNOSIS — M4316 Spondylolisthesis, lumbar region: Secondary | ICD-10-CM

## 2020-11-13 DIAGNOSIS — M4312 Spondylolisthesis, cervical region: Secondary | ICD-10-CM

## 2020-11-13 NOTE — Progress Notes (Signed)
Office Visit Note   Patient: [name]           Date of Birth: [birth year]           MRN: [id number] Visit Date: 11/13/2020              Requested by: Sinda Du, MD No address on file PCP: Sinda Du, MD   Assessment & Plan: Visit Diagnoses:  1. Low back pain, unspecified back pain laterality, unspecified chronicity, unspecified whether sciatica present   2. Cervicalgia   3. Spondylolisthesis, lumbar region   4. History of lumbar spinal fusion   5. History of fusion of cervical spine   6. Positive Lhermitte's sign   7. Acquired spondylolisthesis of cervical vertebra     Plan: Avoid overhead lifting and overhead use of the arms. Do not lift greater than 5-10 lbs. Adjust head rest in vehicle to prevent hyperextension if rear ended. Take extra precautions to avoid falling. Avoid bending, stooping and avoid lifting weights greater than 10 lbs. Avoid prolong standing and walking. Avoid frequent bending and stooping  No lifting greater than 10 lbs. May use ice or moist heat for pain. Weight loss is of benefit. Handicap license is approved. MRI of the lumbar spine to assess for spinal stenosis associated with the spondylolisthesis L5-S1 below the fusion at L3-L5. MRI of  the cervical spine to assess for stenosis at the areas of anterolisthesis at C3-4, C4-5 and C7-T1.    Follow-Up Instructions: Return in about 3 weeks (around 12/04/2020).   Orders:  Orders Placed This Encounter  Procedures   XR Lumbar Spine 2-3 Views   XR Cervical Spine 2 or 3 views   No orders of the defined types were placed in this encounter.     Procedures: No procedures performed   Clinical Data: No additional findings.   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  She has been receiving intermittant ESIs from Dr. Nelva Bush at [facility]. She is having increasing pain into the right upper buttock and transverse at the lumbosacral junction. Pain is made greater with bending and stooping and she is having difficulty with prolong standing in one place and with walking distances. Pain radiates down the backs of the thighs and calves. She is taking Oxycodone IR 30 mg up to  Qid, using voltaren gel. Complains of pain radiation between her shoulder blades with extension of the neck. Previous cervical fusion C5-C7 in 2004. No bowel or bladder dysfunction. Can not grocery shop due to pain and difficulty walking a distance. Tends to stand upright and tries to maintain her posture but fatigues early and has to find a place to sit. Sitting too long causes pain.    Review of Systems  Constitutional: Negative.   HENT: Negative.    Eyes: Negative.   Respiratory: Negative.    Cardiovascular: Negative.   Gastrointestinal: Negative.   Endocrine: Negative.   Genitourinary: Negative.   Musculoskeletal: Negative.   Skin: Negative.   Allergic/Immunologic: Negative.   Neurological: Negative.   Hematological: Negative.   Psychiatric/Behavioral: Negative.      Objective: Vital Signs: BP (!) 177/78 (BP Location: Left Arm, Patient Position: Sitting)   Pulse (!) 52   Ht '5\' 3"'$  (1.6 m)   Wt  156 lb (70.8 kg)   BMI 27.63 kg/m   Physical Exam Constitutional:      Appearance: She is well-developed.  HENT:     Head: Normocephalic and atraumatic.  Eyes:     Pupils: Pupils are equal, round, and reactive to light.  Pulmonary:     Effort: Pulmonary effort is normal.     Breath sounds: Normal breath sounds.  Abdominal:     General: Bowel sounds are normal.     Palpations: Abdomen is soft.  Musculoskeletal:     Cervical back: Normal range of motion and neck supple.     Lumbar back: Negative right straight leg raise test and negative left straight leg raise test.  Skin:     General: Skin is warm and dry.  Neurological:     Mental Status: She is alert and oriented to person, place, and time.  Psychiatric:        Behavior: Behavior normal.        Thought Content: Thought content normal.        Judgment: Judgment normal.   Back Exam   Tenderness  The patient is experiencing tenderness in the lumbar.  Range of Motion  Extension:  abnormal  Flexion:  normal  Lateral bend right:  abnormal  Lateral bend left:  abnormal  Rotation right:  abnormal  Rotation left:  abnormal   Muscle Strength  Right Quadriceps:  5/5  Left Quadriceps:  5/5  Right Hamstrings:  5/5  Left Hamstrings:  5/5   Tests  Straight leg raise right: negative Straight leg raise left: negative  Reflexes  Patellar:  2/4 Achilles:  0/4 Biceps:  2/4 Babinski's sign: normal   Other  Toe walk: abnormal Heel walk: abnormal Sensation: normal Gait: abnormal   Comments:  Decreased ROM Cervical spine with about 40% loss of motion in extension and lateral bending and rotation. Tender posterior C6-T1. UE motor is intact with atrophy of the thumb thenar eminence bilateral. Finger abduction is weak. Finger extension is normal, biceps and triceps strength is intact.  Right EHL is 4/5 Left EHL is 5/5 Knee extension and hip flexion is normal as is foot DF.     Specialty Comments:  No specialty comments available.  Imaging: XR Cervical Spine 2 or 3 views  Result Date: 11/13/2020 AP and lateral flexion and extension radiographs of the cervical spine demonstrate plate and Screws fixing the C5 to C7 level with solid fusions there is degenerative disc narrowing C3-4, C4-5 and C6-7 with Anterolisthesis minimal C3-4 and C4-5 and more significant at the C7-T1 level. Spondylosis Of the facets from C2 to C5. Cervical lordosis is maintained in the upper cervical spine. There Is some kyphosis of the upper thoracic spine.     PMFS History: There are no problems to display for this patient.  History  reviewed. No pertinent past medical history.  History reviewed. No pertinent family history.  History reviewed. No pertinent surgical history. Social History   Occupational History   Not on file  Tobacco Use   Smoking status: Not on file   Smokeless tobacco: Not on file  Substance and Sexual Activity   Alcohol use: Not on file   Drug use: Not on file   Sexual activity: Not on file

## 2020-11-13 NOTE — Patient Instructions (Signed)
Plan: Avoid overhead lifting and overhead use of the arms. Do not lift greater than 5-10 lbs. Adjust head rest in vehicle to prevent hyperextension if rear ended. Take extra precautions to avoid falling. Avoid bending, stooping and avoid lifting weights greater than 10 lbs. Avoid prolong standing and walking. Avoid frequent bending and stooping  No lifting greater than 10 lbs. May use ice or moist heat for pain. Weight loss is of benefit. Handicap license is approved. MRI of the lumbar spine to assess for spinal stenosis associated with the spondylolisthesis L5-S1 below the fusion at L3-L5. MRI of  the cervical spine to assess for stenosis at the areas of anterolisthesis at C3-4, C4-5 and C7-T1.

## 2020-11-18 ENCOUNTER — Encounter: Payer: Self-pay | Admitting: Specialist

## 2020-11-19 ENCOUNTER — Telehealth: Payer: Self-pay | Admitting: Specialist

## 2020-11-19 NOTE — Telephone Encounter (Signed)
Pt called requesting a call from [place]. She has medical questions. Please call pt at [phone].

## 2020-11-19 NOTE — Telephone Encounter (Signed)
I called there was no answer. I will try again later

## 2020-11-20 NOTE — Telephone Encounter (Signed)
Lmom fo rpt to call back amd leave a detailed message

## 2020-11-26 ENCOUNTER — Other Ambulatory Visit: Payer: Self-pay | Admitting: Specialist

## 2020-11-26 DIAGNOSIS — M5412 Radiculopathy, cervical region: Secondary | ICD-10-CM

## 2020-11-26 DIAGNOSIS — M542 Cervicalgia: Secondary | ICD-10-CM

## 2020-11-26 DIAGNOSIS — M4807 Spinal stenosis, lumbosacral region: Secondary | ICD-10-CM

## 2020-11-26 DIAGNOSIS — Z981 Arthrodesis status: Secondary | ICD-10-CM

## 2020-11-26 NOTE — Telephone Encounter (Signed)
Pt left vm stating she has not heard anything about getting scheduled for Mri of Lumbar and cervical spine, no order was placed, I placed the order after looking in ov notes where it was mentioned of ordering both. I tried calling pt back to inform her that orders were placed, no vm was set up.

## 2020-11-26 NOTE — Telephone Encounter (Signed)
Pt called wanting to make sure those referrals were sent to [facility] in [place] since its closer to [place]. Pt would like a CB as confirmation.   [phone]

## 2020-11-26 NOTE — Telephone Encounter (Signed)
Called pt back and informed her orders has been placed and sent to [facility]

## 2020-12-05 DIAGNOSIS — R52 Pain, unspecified: Secondary | ICD-10-CM | POA: Insufficient documentation

## 2020-12-12 ENCOUNTER — Other Ambulatory Visit (HOSPITAL_COMMUNITY): Payer: Medicare Other

## 2020-12-12 ENCOUNTER — Ambulatory Visit (HOSPITAL_COMMUNITY)
Admission: RE | Admit: 2020-12-12 | Discharge: 2020-12-12 | Disposition: A | Discharge status: Home / Self Care | Payer: Medicare Other | Source: Ambulatory Visit | Attending: Specialist | Admitting: Specialist

## 2020-12-12 ENCOUNTER — Other Ambulatory Visit: Payer: Self-pay

## 2020-12-12 DIAGNOSIS — M4807 Spinal stenosis, lumbosacral region: Secondary | ICD-10-CM

## 2020-12-12 DIAGNOSIS — Z981 Arthrodesis status: Secondary | ICD-10-CM | POA: Diagnosis present

## 2020-12-12 DIAGNOSIS — M5412 Radiculopathy, cervical region: Secondary | ICD-10-CM | POA: Diagnosis present

## 2020-12-12 DIAGNOSIS — M542 Cervicalgia: Secondary | ICD-10-CM | POA: Insufficient documentation

## 2020-12-16 ENCOUNTER — Encounter: Payer: Self-pay | Admitting: Specialist

## 2020-12-16 ENCOUNTER — Ambulatory Visit (INDEPENDENT_AMBULATORY_CARE_PROVIDER_SITE_OTHER): Payer: Medicare Other | Admitting: Specialist

## 2020-12-16 ENCOUNTER — Other Ambulatory Visit: Payer: Self-pay

## 2020-12-16 VITALS — BP 107/65 | HR 52 | Ht 63.0 in | Wt 156.0 lb

## 2020-12-16 DIAGNOSIS — M542 Cervicalgia: Secondary | ICD-10-CM

## 2020-12-16 DIAGNOSIS — M48062 Spinal stenosis, lumbar region with neurogenic claudication: Secondary | ICD-10-CM

## 2020-12-16 DIAGNOSIS — R29818 Other symptoms and signs involving the nervous system: Secondary | ICD-10-CM | POA: Diagnosis not present

## 2020-12-16 DIAGNOSIS — M4312 Spondylolisthesis, cervical region: Secondary | ICD-10-CM

## 2020-12-16 DIAGNOSIS — M4802 Spinal stenosis, cervical region: Secondary | ICD-10-CM

## 2020-12-16 DIAGNOSIS — Z981 Arthrodesis status: Secondary | ICD-10-CM | POA: Diagnosis not present

## 2020-12-16 NOTE — Patient Instructions (Signed)
Avoid bending, stooping and avoid lifting weights greater than 10 lbs. Avoid prolong standing and walking. Order for a new walker with wheels. Surgery scheduling secretary Kandice Hams, will call you in the next week to schedule for surgery.  Surgery recommended is a one level lumbar bilateral partial hemilaminectomy L2-3 for spinal stenosis above the L3-L5 fusion this would be done with the operating room microscope. Continue with your regular medications for pain . Risk of surgery includes risk of infection 1 in 200 patients, bleeding 1/2% chance you would need a transfusion.   Risk to the nerves is one in 10,000. Expect improved walking and standing tolerance. Expect relief of leg pain but numbness may persist depending on the length and degree of pressure that has been present.

## 2020-12-16 NOTE — Progress Notes (Signed)
Office Visit Note   Patient: [name]           Date of Birth: 1945-09-04           MRN: [id number] Visit Date: 12/16/2020              Requested by: Sinda Du, MD No address on file PCP: Sinda Du, MD   Assessment & Plan: Visit Diagnoses:  1. History of fusion of cervical spine   2. Acquired spondylolisthesis of cervical vertebra   3. Positive Lhermitte's sign   4. Cervicalgia   5. History of lumbar spinal fusion   6. Spinal stenosis of lumbar region with neurogenic claudication   7. Spinal stenosis of cervical region   8. Status post cervical spinal fusion     Plan: Avoid bending, stooping and avoid lifting weights greater than 10 lbs. Avoid prolong standing and walking. Order for a new walker with wheels. Surgery scheduling secretary Kandice Hams, will call you in the next week to schedule for surgery.  Surgery recommended is a one level lumbar bilateral partial hemilaminectomy L2-3 for spinal stenosis above the L3-L5 fusion this would be done with the operating room microscope. Continue with your regular medications for pain . Risk of surgery includes risk of infection 1 in 100 patients, bleeding 1/2% chance you would need a transfusion.   Risk to the nerves is one in 10,000. Expect improved walking and standing tolerance. Expect relief of leg pain but numbness may persist depending on the length and degree of pressure that has been present.  Follow-Up Instructions: Return in about 4 weeks (around 01/13/2021).   Orders:  No orders of the defined types were placed in this encounter.  No orders of the defined types were placed in this encounter.     Procedures: No procedures performed   Clinical Data: Findings:  Narrative & Impression CLINICAL DATA:  Low back pain, left leg pain and weakness   EXAM: MRI LUMBAR SPINE WITHOUT CONTRAST   TECHNIQUE: Multiplanar, multisequence MR imaging of the lumbar spine was performed. No intravenous  contrast was administered.   COMPARISON:  01/29/2004, correlation is also made with lumbar spine radiographs 11/13/2020   FINDINGS: Segmentation:  Standard.   Alignment: Levocurvature of the lumbar spine, centered on L3-L4. Stepwise trace retrolisthesis of T12 on L1 and L1 on L2. And grade 1 anterolisthesis L5 on S1, which is new since 2005 and present on the recent radiographs.   Vertebrae: Status post L3-L5 posterior fusion, with interbody disc spacer and posterior decompression at L4-L5. T1 and T2 hyperintense signal at the endplates adjacent to the T12-L1 disc space, likely Modic type 2 endplate degenerative changes. No acute fracture, evidence of discitis, or suspicious bone lesion.   Conus medullaris and cauda equina: Conus extends to the L2 level. Conus and cauda equina appear normal.   Paraspinal and other soft tissues: Fatty atrophy of the inferior paraspinous muscles. Otherwise negative.   Disc levels:   T11-T12: Seen only on the sagittal images. Minimal disc bulge. No spinal canal stenosis or neural foraminal narrowing.   T12-L1: Trace retrolisthesis. Left eccentric broad-based disc bulge. Mild facet arthropathy. No spinal canal stenosis. Moderate left neural foraminal narrowing.   L1-L2: Trace retrolisthesis. Broad-based disc bulge. Mild facet arthropathy. No spinal canal stenosis. Moderate to severe left and mild right neural foraminal narrowing.   L2-L3: Broad-based disc bulge. Moderate to severe facet arthropathy with ligamentum flavum hypertrophy. Effacement of the bilateral lateral recesses. Severe spinal canal stenosis,  which is new from the prior exam. Severe left and moderate right neural foraminal narrowing is also new from the prior exam.   L3-L4: Status post fusion. No significant disc bulge. Osseous fusion across the facets. No spinal canal stenosis or neural foraminal narrowing.   L4-L5: Status post fusion and decompression. No spinal  canal stenosis or neural foraminal narrowing.   L5-S1: Grade 1 anterolisthesis with disc unroofing and broad-based disc bulge. Severe bilateral facet arthropathy. No spinal canal stenosis. Mild bilateral neural foraminal narrowing.   IMPRESSION: 1. 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 Effacement of the lateral recesses at this level may affect the descending L3 nerves. 2. Multilevel facet arthropathy, which causes moderate left neural foraminal narrowing at T12-L1, moderate to severe left and mild right neural foraminal narrowing at L1-L2, and mild bilateral neural foraminal narrowing at L5-S1.     Electronically Signed   By: Merilyn Baba M.D.   On: 12/14/2020 17:49   Narrative & Impression CLINICAL DATA:  Bilateral scapular pain   EXAM: MRI CERVICAL SPINE WITHOUT CONTRAST   TECHNIQUE: Multiplanar, multisequence MR imaging of the cervical spine was performed. No intravenous contrast was administered.   COMPARISON:  No prior MRI of the cervical spine. Correlation is made with cervical spine radiographs 11/13/2020 and 04/16/2005 CT cervical spine   FINDINGS: Alignment: 3 mm anterolisthesis C7 on T1. mild exaggeration of the normal cervical lordosis.   Vertebrae: Status post ACDF C5-C7, with solid osseous fusion across C5-C6. Osseous fusion is not definitively seen across C6-C7. No acute fracture, evidence of discitis, or suspicious lesion.   Cord: Focal increased T2 signal within the spinal cord at C6 (series 15, image 21 and series 12, image 9). No evidence of cord expansion at this level. The spinal cord is otherwise normal in signal and morphology.   Posterior Fossa, vertebral arteries, paraspinal tissues: Negative.   Disc levels:   C2-C3: No significant disc bulge.  Ligamentum flavum hypertrophy. Left greater than right facet and uncovertebral hypertrophy. No spinal canal stenosis. Mild left neural foraminal narrowing.   C3-C4: No significant disc bulge. Ligamentum flavum hypertrophy. Left greater than right facet and uncovertebral hypertrophy. Severe left and moderate right neural foraminal narrowing. No spinal canal stenosis.   C4-C5: Broad-based disc bulge. Ligamentum flavum hypertrophy. Facet and uncovertebral hypertrophy. Moderate to severe spinal canal stenosis. Moderate to severe bilateral neural foraminal narrowing.   C5-C6: Status post fusion. Osseous hypertrophy at the posterior aspect of C6, which with ligamentum flavum hypertrophy, causes mild spinal canal stenosis. No neural foraminal narrowing.   C6-C7: No spinal canal stenosis. Left-greater-than-right uncovertebral hypertrophy. Mild left neural foraminal narrowing.   C7-T1: Mild anterolisthesis with disc unroofing. No spinal canal stenosis. No neural foraminal narrowing.   IMPRESSION: 1. Status post C5-C7 ACDF, with solid osseous fusion across C5-C6 but not definitively across C6-C7. There is osseous hypertrophy of the posterior aspect of C6, adjacent to a small focus of increased T2 signal within the central spinal cord, likely chronic myelomalacia. 2. C4-C5 moderate to severe spinal canal stenosis and moderate to severe bilateral neural foraminal narrowing. 3. C3-C4 severe left and moderate right neural foraminal narrowing.     Electronically Signed   By: Merilyn Baba M.D.   On: 12/14/2020 17:31        Subjective: Chief Complaint  Patient presents with   Neck -  Follow-up, Pain    MRI cervical spine review   Lower Back - Follow-up, Pain    MRI lumbar review    75 year old female with history of L3-4 and L4-5 fusion in the past now with increasing back and right leg pain. Pain is worse into the right posterior and lateral thigh. Underwent recent MRI of the  cervical and lumbar spine. Returns today. Saw Dr. Nelva Bush recently. No bowel or bladder other than constipation.   Review of Systems  Constitutional: Negative.   HENT: Negative.    Eyes: Negative.   Respiratory: Negative.    Cardiovascular: Negative.   Gastrointestinal: Negative.   Endocrine: Negative.   Genitourinary: Negative.   Musculoskeletal: Negative.   Skin: Negative.   Allergic/Immunologic: Negative.   Neurological: Negative.   Hematological: Negative.   Psychiatric/Behavioral: Negative.      Objective: Vital Signs: BP 107/65   Pulse (!) 52   Ht 5\' 3"  (1.6 m)   Wt 156 lb (70.8 kg)   BMI 27.63 kg/m   Physical Exam Constitutional:      Appearance: She is well-developed.  HENT:     Head: Normocephalic and atraumatic.  Eyes:     Pupils: Pupils are equal, round, and reactive to light.  Pulmonary:     Effort: Pulmonary effort is normal.     Breath sounds: Normal breath sounds.  Abdominal:     General: Bowel sounds are normal.     Palpations: Abdomen is soft.  Musculoskeletal:     Cervical back: Normal range of motion and neck supple.     Lumbar back: Negative right straight leg raise test and negative left straight leg raise test.  Skin:    General: Skin is warm and dry.  Neurological:     Mental Status: She is alert and oriented to person, place, and time.  Psychiatric:        Behavior: Behavior normal.        Thought Content: Thought content normal.        Judgment: Judgment normal.   Back Exam   Tenderness  The patient is experiencing tenderness in the lumbar.  Range of Motion  Extension:  abnormal  Flexion:  normal  Lateral bend right:  abnormal  Lateral bend left:  abnormal  Rotation right:  abnormal  Rotation left:  abnormal   Muscle Strength  Right Quadriceps:  5/5  Left Quadriceps:  5/5  Right Hamstrings:  5/5  Left Hamstrings:  5/5   Tests  Straight leg raise right: negative Straight leg raise left: negative  Reflexes  Patellar:   2/4 Achilles:  2/4  Other  Heel walk: normal Sensation: decreased Gait: abnormal   Comments:  Bilateral hip flexion strength is decreased 4/5 remainer is normal .     Specialty Comments:  No specialty comments available.  Imaging: No results found.   PMFS History: There are no problems to display for this patient.  History reviewed. No pertinent past medical history.  History reviewed. No pertinent family history.  History reviewed. No pertinent surgical history. Social History   Occupational History   Not on file  Tobacco Use   Smoking status: Never   Smokeless tobacco: Never  Substance and Sexual Activity   Alcohol use: Not on file   Drug use: Not on file   Sexual activity: Not on file

## 2021-01-16 ENCOUNTER — Encounter: Payer: Self-pay | Admitting: Specialist

## 2021-01-16 ENCOUNTER — Other Ambulatory Visit: Payer: Self-pay

## 2021-01-16 ENCOUNTER — Ambulatory Visit: Payer: Medicare Other | Admitting: Specialist

## 2021-01-16 ENCOUNTER — Ambulatory Visit: Payer: Self-pay

## 2021-01-16 VITALS — BP 156/76 | HR 51 | Ht 63.0 in | Wt 154.0 lb

## 2021-01-16 DIAGNOSIS — M546 Pain in thoracic spine: Secondary | ICD-10-CM | POA: Diagnosis not present

## 2021-01-16 DIAGNOSIS — M48062 Spinal stenosis, lumbar region with neurogenic claudication: Secondary | ICD-10-CM

## 2021-01-16 DIAGNOSIS — Z981 Arthrodesis status: Secondary | ICD-10-CM | POA: Diagnosis not present

## 2021-01-16 DIAGNOSIS — M47814 Spondylosis without myelopathy or radiculopathy, thoracic region: Secondary | ICD-10-CM

## 2021-01-16 NOTE — Progress Notes (Signed)
   Office Visit Note   Patient: [name]           Date of Birth: [birth year]           MRN: [id number] Visit Date: 01/16/2021              Requested by: Karleen Hampshire., MD [id number] [place],  [place] [address] PCP: Karleen Hampshire., MD   Assessment & Plan: Visit Diagnoses:  1. Pain in thoracic spine   2. Spinal stenosis of lumbar region with neurogenic claudication   3. History of fusion of cervical spine   4. Thoracic spondylosis     Plan: Avoid bending, stooping and avoid lifting weights greater than 10 lbs. Avoid prolong standing and walking. Order for a new walker with wheels. Surgery scheduling secretary Kandice Hams, will call you in the next week to schedule for surgery.  Surgery recommended is a one level lumbar bilateral partial hemilaminectomy L2-3 for spinal stenosis above the L3-L5 fusion this would be done with the operating room microscope. Continue with your regular medications for pain . Risk of surgery includes risk of infection 1 in 100 patients, bleeding 1/2% chance you would need a transfusion.   Risk to the nerves is one in 10,000. Expect improved walking and standing tolerance. Expect relief of leg pain but numbness may persist depending on the length and degree of pressure that has been present. As your family may not be able to assist you during the post op care a short term stay in the nursing home 1-2 weeks prior to going home.  Follow-Up Instructions: Return in about 4 weeks (around 02/13/2021).   Orders:  Orders Placed This Encounter  Procedures   XR Thoracic Spine 2 View   No orders of the defined types were placed in this encounter.     Procedures: No procedures performed   Clinical Data: No additional findings.   Subjective: Chief Complaint  Patient presents with   Neck - Follow-up   Lower Back - Follow-up    75 year old female with lumbar spinal stenosis L2-3 above the lumbar fusion. She is to undergo  laminectomy L2-3 bilateral for stenosis. No bowel or bladder.     Review of Systems   Objective: Vital Signs: BP (!) 156/76 (BP Location: Left Arm, Patient Position: Sitting)   Pulse (!) 51   Ht 5\' 3"  (1.6 m)   Wt 154 lb (69.9 kg)   BMI 27.28 kg/m   Physical Exam  Ortho Exam  Specialty Comments:  No specialty comments available.  Imaging: No results found.   PMFS History: There are no problems to display for this patient.  No past medical history on file.  No family history on file.  No past surgical history on file. Social History   Occupational History   Not on file  Tobacco Use   Smoking status: Never   Smokeless tobacco: Never  Substance and Sexual Activity   Alcohol use: Not on file   Drug use: Not on file   Sexual activity: Not on file

## 2021-01-16 NOTE — Patient Instructions (Signed)
Plan: Avoid bending, stooping and avoid lifting weights greater than 10 lbs. Avoid prolong standing and walking. Order for a new walker with wheels. Surgery scheduling secretary Kandice Hams, will call you in the next week to schedule for surgery.  Surgery recommended is a one level lumbar bilateral partial hemilaminectomy L2-3 for spinal stenosis above the L3-L5 fusion this would be done with the operating room microscope. Continue with your regular medications for pain . Risk of surgery includes risk of infection 1 in 100 patients, bleeding 1/2% chance you would need a transfusion.   Risk to the nerves is one in 10,000. Expect improved walking and standing tolerance. Expect relief of leg pain but numbness may persist depending on the length and degree of pressure that has been present. As your family may not be able to assist you during the post op care a short term stay in the nursing home 1-2 weeks prior to going home.

## 2021-01-20 DIAGNOSIS — R7301 Impaired fasting glucose: Secondary | ICD-10-CM | POA: Insufficient documentation

## 2021-02-26 ENCOUNTER — Emergency Department (HOSPITAL_COMMUNITY)
Admission: EM | Admit: 2021-02-26 | Discharge: 2021-02-26 | Disposition: A | Discharge status: Home / Self Care | Payer: Medicare Other | Attending: Emergency Medicine | Admitting: Emergency Medicine

## 2021-02-26 ENCOUNTER — Other Ambulatory Visit: Payer: Self-pay

## 2021-02-26 ENCOUNTER — Encounter (HOSPITAL_COMMUNITY): Payer: Self-pay | Admitting: *Deleted

## 2021-02-26 ENCOUNTER — Emergency Department (HOSPITAL_COMMUNITY): Payer: Medicare Other

## 2021-02-26 DIAGNOSIS — Z79899 Other long term (current) drug therapy: Secondary | ICD-10-CM | POA: Diagnosis not present

## 2021-02-26 DIAGNOSIS — W182XXA Fall in (into) shower or empty bathtub, initial encounter: Secondary | ICD-10-CM | POA: Insufficient documentation

## 2021-02-26 DIAGNOSIS — Z8572 Personal history of non-Hodgkin lymphomas: Secondary | ICD-10-CM | POA: Diagnosis not present

## 2021-02-26 DIAGNOSIS — M25532 Pain in left wrist: Secondary | ICD-10-CM | POA: Insufficient documentation

## 2021-02-26 DIAGNOSIS — S60212A Contusion of left wrist, initial encounter: Secondary | ICD-10-CM | POA: Diagnosis not present

## 2021-02-26 DIAGNOSIS — S6992XA Unspecified injury of left wrist, hand and finger(s), initial encounter: Secondary | ICD-10-CM | POA: Diagnosis present

## 2021-02-26 HISTORY — DX: Personal history of non-Hodgkin lymphomas: Z85.72

## 2021-02-26 HISTORY — DX: Unspecified osteoarthritis, unspecified site: M19.90

## 2021-02-26 MED ORDER — NAPROXEN 500 MG PO TABS: 500.0000 mg | ORAL_TABLET | Freq: Two times a day (BID) | ORAL | 0 refills | Status: DC

## 2021-02-26 NOTE — ED Triage Notes (Signed)
Pt fell while out of the shower last night.  Pt states she hit her head on a small table, denies LOC.  Takes baby ASA occasionally. C/o left wrist pain since fall.

## 2021-02-26 NOTE — ED Provider Notes (Signed)
[facility] EMERGENCY DEPARTMENT Provider Note   CSN: [id number] Arrival date & time: 02/26/21  1714     History Chief Complaint  Patient presents with   [name] is a 75 y.o. female.   Fall  This patient is a 75 year old female, she presents to the hospital with a complaint of left-sided wrist pain, this occurred when she was in the shower and lost her balance yesterday.  Since that time she fell out of the shower landing on her left arm squeezing her wrist between her body and a linen table.  This caused acute onset of pain with some progressive swelling over the last 24 hours.  No numbness or tingling or weakness in the hand or the fingers however there is redness and swelling of the wrist on the left.  She is right-hand dominant.  She did bump her head but had no loss of consciousness and has no residual discomfort in the head.  She does not take any anticoagulants.    Past Medical History:  Diagnosis Date   Arthritis    Hx of non-Hodgkin's lymphoma     There are no problems to display for this patient.   History reviewed. No pertinent surgical history.   OB History   No obstetric history on file.     History reviewed. No pertinent family history.  Social History   Tobacco Use   Smoking status: Never   Smokeless tobacco: Never  Substance Use Topics   Alcohol use: Not Currently   Drug use: Never    Home Medications Prior to Admission medications   Medication Sig Start Date End Date Taking? Authorizing Provider  naproxen (NAPROSYN) 500 MG tablet Take 1 tablet (500 mg total) by mouth 2 (two) times daily with a meal. 02/26/21  Yes Noemi Chapel, MD  ALPRAZolam Duanne Moron) 0.5 MG tablet Take 0.5 mg by mouth 2 (two) times daily as needed. 10/26/20   [provider]  chlorthalidone (HYGROTON) 25 MG tablet chlorthalidone 25 mg tablet 11/13/15   [provider]  diclofenac Sodium (VOLTAREN) 1 % GEL Voltaren Arthritis Pain 1 % topical  gel  APPLY 2 GRAMS TO THE AFFECTED AREA(S) BY TOPICAL ROUTE 4 TIMES PER DAY 08/19/17   [provider]  metoprolol succinate (TOPROL-XL) 100 MG 24 hr tablet metoprolol succinate ER 100 mg tablet,extended release 24 hr 05/30/14   [provider]  oxycodone (ROXICODONE) 30 MG immediate release tablet Take 30 mg by mouth 4 (four) times daily as needed. 11/20/20   [provider]  simvastatin (ZOCOR) 40 MG tablet simvastatin 40 mg tablet 04/13/16   [provider]    Allergies    Morphine  Review of Systems   Review of Systems  Musculoskeletal:  Positive for arthralgias.  Skin:  Negative for wound.  Neurological:  Negative for weakness and numbness.   Physical Exam Updated Vital Signs BP (!) 148/61    Pulse 62    Temp 98.5 F (36.9 C) (Oral)    Resp 18    Ht 1.613 m (5' 3.5")    Wt 65.3 kg    SpO2 100%    BMI 25.11 kg/m   Physical Exam Vitals and nursing note reviewed.  Constitutional:      Appearance: She is well-developed. She is not diaphoretic.  HENT:     Head: Normocephalic and atraumatic.  Eyes:     General:        Right eye: No discharge.  Left eye: No discharge.     Conjunctiva/sclera: Conjunctivae normal.  Pulmonary:     Effort: Pulmonary effort is normal. No respiratory distress.  Musculoskeletal:        General: Swelling and tenderness present. No deformity.     Right lower leg: No edema.     Left lower leg: No edema.  Skin:    General: Skin is warm and dry.     Findings: No erythema or rash.  Neurological:     Mental Status: She is alert.     Coordination: Coordination normal.    ED Results / Procedures / Treatments   Labs (all labs ordered are listed, but only abnormal results are displayed) Labs Reviewed - No data to display  EKG None  Radiology DG Wrist Complete Left  Result Date: 02/26/2021 CLINICAL DATA:  Pain and swelling in the left wrist after a fall. EXAM: LEFT WRIST - COMPLETE 3+ VIEW COMPARISON:  None.  FINDINGS: Degenerative changes in the carpal bones with predominant involvement of the STT and first carpometacarpal joints. Small subcortical cysts in the scaphoid, lunate, hamate, and triquetral bones. 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 Dorsal soft tissue swelling is demonstrated. No acute displaced fractures are identified. 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 Dorsal soft tissue swelling. No acute displaced fractures are identified. Electronically Signed   By: Lucienne Capers M.D.   On: 02/26/2021 18:44    Procedures Procedures   Medications Ordered in ED Medications - No data to display  ED Course  I have reviewed the triage vital signs and the nursing notes.  Pertinent labs & imaging results that were available during my care of the patient were reviewed by me and considered in my medical decision making (see chart for details).    MDM Rules/Calculators/A&P                          I have personally viewed and interpreted the x-ray, there is no signs of acute fracture though there is severe degeneration of the wrist.  I agree with the radiologist interpretation.  The patient was given a splint for comfort, I encouraged the patient to follow-up for repeat x-rays if no better  The patient is agreeable to the plan, vital signs are unremarkable, she is amenable to anti-inflammatory treatment, and orthopedic follow-up     Final Clinical Impression(s) / ED Diagnoses Final diagnoses:  Contusion of left wrist, initial encounter    Rx / DC Orders ED Discharge Orders          Ordered    naproxen (NAPROSYN) 500 MG tablet  2 times daily with meals        02/26/21 2311             Noemi Chapel, MD 02/26/21 2311

## 2021-02-26 NOTE — Discharge Instructions (Signed)
Your x-ray today does not reveal any signs of broken bones however it does appear that you have a contusion or sprain of your wrist and you may benefit from using the splint that we have given you.  Keep your arm elevated above your heart to minimize swelling, apply ice intermittently and take Naprosyn twice a day for pain.  You should have your family doctor recheck you within 10 days if you are still having pain as you may need to have a repeat x-ray.  There are sometimes when the initial x-ray does not show a fracture especially when you have severe arthritis of your wrist which you do.  A repeat x-ray may reveal a fracture that was not seen on the initial exam

## 2021-03-10 ENCOUNTER — Encounter: Payer: Self-pay | Admitting: Orthopedic Surgery

## 2021-03-10 ENCOUNTER — Ambulatory Visit: Payer: Medicare Other | Admitting: Orthopedic Surgery

## 2021-03-10 DIAGNOSIS — K219 Gastro-esophageal reflux disease without esophagitis: Secondary | ICD-10-CM | POA: Insufficient documentation

## 2021-03-14 ENCOUNTER — Ambulatory Visit: Payer: Medicare Other | Admitting: Orthopedic Surgery

## 2021-03-14 ENCOUNTER — Encounter: Payer: Self-pay | Admitting: Orthopedic Surgery

## 2021-03-14 ENCOUNTER — Other Ambulatory Visit: Payer: Self-pay

## 2021-03-14 DIAGNOSIS — W182XXA Fall in (into) shower or empty bathtub, initial encounter: Secondary | ICD-10-CM

## 2021-03-14 DIAGNOSIS — S63502A Unspecified sprain of left wrist, initial encounter: Secondary | ICD-10-CM

## 2021-03-14 NOTE — Patient Instructions (Signed)
Wrist Fracture Rehab Ask your health care provider which exercises are safe for you. Do exercises exactly as told by your health care provider and adjust them as directed. It is normal to feel mild stretching, pulling, tightness, or discomfort as you do these exercises. Stop right away if you feel sudden pain or your pain gets worse. Do not begin these exercises until told by your health care provider. Stretching and range-of-motion exercises These exercises warm up your muscles and joints and improve the movement and flexibility of your wrist and hand. These exercises also help to relieve pain,numbness, and tingling. Finger flexion and extension Sit or stand with your elbow at your side. Open and stretch your left / right fingers as wide as you can (extension). Hold this position for 10 seconds. Close your left / right fingers into a gentle fist (flexion). Hold this position for 10 seconds. Slowly return to the starting position. Repeat 10 times. Complete this exercise 1-2 times a day. Wrist flexion Bend your left / right elbow to a 90-degree angle (right angle) with your palm facing the floor. Bend your wrist forward so your fingers point toward the floor (flexion). Hold this position for 10 seconds. Slowly return to the starting position. Repeat 10 times. Complete this exercise 1-2 times a day. Wrist extension Bend your left / right elbow to a 90-degree angle (right angle) with your palm facing the floor. Bend your wrist backward so your fingers point toward the ceiling (extension). Hold this position for 10 seconds. Slowly return to the starting position. Repeat 10 times. Complete this exercise 1-2 times a day. Ulnar deviation Bend your left / right elbow to a 90-degree angle (right angle), and rest your forearm on a table with your palm facing down. Keeping your hand flat on the table, bend your left / right wrist toward your small finger (pinkie). This is ulnar deviation. Hold this  position for 10 seconds. Slowly return to the starting position. Repeat 10 times. Complete this exercise 1-2 times a day. Radial deviation Bend your left / right elbow to a 90-degree angle (right angle), and rest your forearm on a table with your palm facing down. Keeping your hand flat on the table, bend your left / right wrist toward your thumb. This is radial deviation. Hold this position for 10 seconds. Slowly return to the starting position. Repeat 10 times. Complete this exercise 1-2 times a day. Forearm rotation, supination Stand or sit with your left / right elbow bent to a 90-degree angle (right angle) at your side. Position your forearm so that the thumb is facing the ceiling (neutral position). Turn (rotate) your palm up toward the ceiling (supination), stopping when you feel a gentle stretch. Hold this position for 10 seconds. Slowly return to the starting position. Repeat 10 times. Complete this exercise 1-2 times a day. Forearm rotation, pronation Stand or sit with your left / right elbow bent to a 90-degree angle (right angle) at your side. Position your forearm so that the thumb is facing the ceiling (neutral position). Turn (rotate) your palm down toward the floor (pronation), stopping when you feel a gentle stretch. Hold this position for 10 seconds. Slowly return to the starting position. Repeat 10 times. Complete this exercise 1-2 times a day. Wrist flexion stretch  Extend your left / right arm in front of you and turn your palm down toward the floor. If told by your health care provider, bend your left / right arm to a 90-degree angle (  right angle) at your side. Using your uninjured hand, gently press over the back of your left / right hand to bend your wrist and fingers toward the floor (flexion). Go as far as you can to feel a stretch without causing pain. Hold this position for 10 seconds. Slowly return to the starting position. Repeat 10 times. Complete this  exercise 1-2 times a day. Wrist extension stretch  Extend your left / right arm in front of you and turn your palm up toward the ceiling. If told by your health care provider, bend your left / right arm to a 90-degree angle (right angle) at your side. Using your uninjured hand, gently press over the palm of your left / right hand to bend your wrist and fingers toward the floor (extension). Go as far as you can to feel a stretch without causing pain. Hold this position for 10 seconds. Slowly return to the starting position. Repeat 10 times. Complete this exercise 1-2 times a day. Forearm rotation stretch, supination Stand or sit with your arms at your sides. Bend your left / right elbow to a 90-degree angle (right angle). Using your uninjured hand, turn your left / right palm up toward the ceiling (assisted supination) until you feel a gentle stretch in the inside of your forearm. Hold this position for 10 seconds. Slowly return to the starting position. Repeat 10 times. Complete this exercise 1-2 times a day. Forearm rotation stretch, pronation Stand or sit with your arms at your sides. Bend your left / right elbow to a 90-degree angle (right angle). Using your uninjured hand, turn your left / right palm down toward the floor (assisted pronation) until you feel a gentle stretch in the top of your forearm. Hold this position for 10 seconds. Slowly return to the starting position. Repeat 10 times. Complete this exercise 1-2 times a day. Strengthening exercises These exercises build strength and endurance in your wrist and hand. Enduranceis the ability to use your muscles for a long time, even after they get tired. Wrist flexion Sit with your left / right forearm supported on a table. Your elbow should be at waist height. Rest your hand over the edge of the table, palm up. Gently grasp a 5 lb / kg weight (can of soup). Or, hold an exercise band or tube in both hands, keeping your hands at  the same level and hip distance apart. There should be slight tension in the exercise band or tube. Without moving your forearm or elbow, slowly bend your wrist up toward the ceiling (wrist flexion). Hold this position for 10 seconds. Slowly return to the starting position. Repeat 10 times. Complete this exercise 1-2 times a day. Wrist extension Sit with your left / right forearm supported on a table. Your elbow should be at waist height. Rest your hand over the edge of the table, palm down. Gently grasp a 5 lb / kg weight. Or, hold an exercise band or tube in both hands, keeping your hands at the same level and hip distance apart. There should be slight tension in the exercise band or tube. Without moving your forearm or elbow, slowly curl your hand up toward the ceiling (extension). Hold this position for 10 seconds. Slowly return to the starting position. Repeat 10 times. Complete this exercise 1-2 times a day. Forearm rotation, supination  Sit with your left / right forearm supported on a table. Your elbow should be at waist height. Rest your hand over the edge of the   table, palm down. Gently grasp a lightweight hammer near the head. As this exercise gets easier for you, try holding the hammer farther down the handle. Without moving your elbow, slowly turn (rotate) your palm up toward the ceiling (supination). Hold this position for 10 seconds. Slowly return to the starting position. Repeat 10 times. Complete this exercise 1-2 times a day. Forearm rotation, pronation  Sit with your left / right forearm supported on a table. Your elbow should be at waist height. Rest your hand over the edge of the table, palm up. Gently grasp a lightweight hammer near the head. As this exercise gets easier for you, try holding the hammer farther down the handle. Without moving your elbow, slowly turn (rotate) your palm down toward the floor (pronation). Hold this position for 10 seconds. Slowly return  to the starting position. Repeat 10 times. Complete this exercise 1-2 times a day. Grip strengthening  Hold one of these items in your left / right hand: a dense sponge, a stress ball, or a large, rolled sock. Slowly squeeze the object as hard as you can without increasing any pain. Hold your squeeze for 10 seconds. Slowly release your grip. Repeat 10 times. Complete this exercise 1-2 times a day. This information is not intended to replace advice given to you by your health care provider. Make sure you discuss any questions you have with your healthcare provider. Document Revised: 06/29/2019 Document Reviewed: 06/29/2019 Elsevier Patient Education  2022 [employer]  

## 2021-03-14 NOTE — Progress Notes (Signed)
New Patient Visit  Assessment: [name] is a 76 y.o. female with the following: 1. Wrist sprain, left, initial encounter  Plan: Patient fell in the shower approximately 2 weeks ago.  Continues to have pain in the left wrist.  Pain is worsened when she is not wearing the brace.  Reviewed radiographs with the patient which does not demonstrate an acute injury.  Presentation is most consistent with a sprain at this time.  I recommended a home exercise program.  She will contact the clinic if she has any further issues.  Continue with current medications.   Follow-up: Return if symptoms worsen or fail to improve.  Subjective:  Chief Complaint  Patient presents with   Wrist Pain    LT DOI 02/25/21/ pt states pain is better but still hurts a lot    History of Present Illness: [name] is a 76 y.o. female who presents for evaluation of left wrist pain.  She states that she fell while getting out of the shower, approximately 2-3 weeks ago.  She twisted her right most of the time.  She presented to the emergency department, and radiographs were negative.  She was placed in a thumb spica splint.  Her pain is improved, but she continues to have aching pains in the wrist, thumb and into her forearm.  Pain is worsened when she does not wear the wrist brace.   Review of Systems: No fevers or chills No numbness or tingling No chest pain No shortness of breath No bowel or bladder dysfunction No GI distress No headaches   Medical History:  Past Medical History:  Diagnosis Date   Arthritis    Hx of non-Hodgkin's lymphoma     No past surgical history on file.  No family history on file. Social History   Tobacco Use   Smoking status: Never   Smokeless tobacco: Never  Substance Use Topics   Alcohol use: Not Currently   Drug use: Never    Allergies  Allergen Reactions   Morphine Rash    Current Meds  Medication Sig   ALPRAZolam (XANAX) 0.5 MG tablet Take 0.5 mg  by mouth 2 (two) times daily as needed.   chlorthalidone (HYGROTON) 25 MG tablet chlorthalidone 25 mg tablet   diclofenac Sodium (VOLTAREN) 1 % GEL Voltaren Arthritis Pain 1 % topical gel  APPLY 2 GRAMS TO THE AFFECTED AREA(S) BY TOPICAL ROUTE 4 TIMES PER DAY   DULoxetine (CYMBALTA) 30 MG capsule Take 30-60 mg by mouth as directed.   gabapentin (NEURONTIN) 800 MG tablet Take 800 mg by mouth 3 (three) times daily.   lisinopril (ZESTRIL) 10 MG tablet Take 10 mg by mouth daily.   metoprolol succinate (TOPROL-XL) 100 MG 24 hr tablet metoprolol succinate ER 100 mg tablet,extended release 24 hr   naproxen (NAPROSYN) 500 MG tablet Take 1 tablet (500 mg total) by mouth 2 (two) times daily with a meal.   oxycodone (ROXICODONE) 30 MG immediate release tablet Take 30 mg by mouth 4 (four) times daily as needed.   simvastatin (ZOCOR) 40 MG tablet simvastatin 40 mg tablet    Objective: There were no vitals taken for this visit.  Physical Exam:  General: Elderly female., Alert and oriented., and No acute distress. Gait: Normal gait.  Left wrist without deformity.  Slightly restricted range of motion in the wrist and the thumb.  Sensation is intact throughout the left hand.  Fingers are warm and well-perfused.  2+ radial pulse.  Mild bruising  over the radial aspect of the wrist.  IMAGING: I personally reviewed images previously obtained from the ED  X-rays without acute injury.  Possible chronic injury to the wrist, including the SL interval.  New Medications:  No orders of the defined types were placed in this encounter.     Mordecai Rasmussen, MD  03/14/2021 11:37 PM

## 2021-03-18 ENCOUNTER — Telehealth: Payer: Self-pay | Admitting: Specialist

## 2021-03-18 NOTE — Telephone Encounter (Signed)
Pt called stating she's seeing Dr. Adline Mango @ [facility] and waiting for the Dr to clear her for surgery. Pt would like our office to send a clearance form to Dr.Furr so that if she gets cleared at her appt on 04/07/21 she can start the process for her surgery with Dr.Nitka.

## 2021-03-24 ENCOUNTER — Ambulatory Visit: Payer: Medicare Other | Admitting: Specialist

## 2021-03-25 DIAGNOSIS — R296 Repeated falls: Secondary | ICD-10-CM | POA: Insufficient documentation

## 2021-03-25 NOTE — Telephone Encounter (Signed)
Re-faxed.

## 2021-04-17 ENCOUNTER — Encounter: Payer: Self-pay | Admitting: Specialist

## 2021-04-17 ENCOUNTER — Ambulatory Visit (INDEPENDENT_AMBULATORY_CARE_PROVIDER_SITE_OTHER): Payer: Medicare Other | Admitting: Specialist

## 2021-04-17 ENCOUNTER — Other Ambulatory Visit: Payer: Self-pay

## 2021-04-17 VITALS — BP 109/54 | HR 126 | Ht 63.5 in | Wt 144.0 lb

## 2021-04-17 DIAGNOSIS — M542 Cervicalgia: Secondary | ICD-10-CM | POA: Diagnosis not present

## 2021-04-17 DIAGNOSIS — M47814 Spondylosis without myelopathy or radiculopathy, thoracic region: Secondary | ICD-10-CM

## 2021-04-17 DIAGNOSIS — M48062 Spinal stenosis, lumbar region with neurogenic claudication: Secondary | ICD-10-CM | POA: Diagnosis not present

## 2021-04-17 DIAGNOSIS — M4802 Spinal stenosis, cervical region: Secondary | ICD-10-CM | POA: Diagnosis not present

## 2021-04-17 NOTE — Progress Notes (Signed)
Office Visit Note   Patient: [name]           Date of Birth: [birth year]           MRN: [id number] Visit Date: 04/17/2021              Requested by: Karleen Hampshire., MD [address] [place],   [address] PCP: Karleen Hampshire., MD   Assessment & Plan: Visit Diagnoses:  1. Spinal stenosis of lumbar region with neurogenic claudication   2. Thoracic spondylosis   3. Cervicalgia   4. Spinal stenosis of cervical region     Plan: Avoid frequent bending and stooping  No lifting greater than 10 lbs. May use ice or moist heat for pain. Weight loss is of benefit. Best medication for lumbar disc disease is arthritis medications like motrin, celebrex and naprosyn. Exercise is important to improve your indurance and does allow people to function better inspite of back pain.  For the thoracic spine I recommend physical therapy with core strengthening, heat massage and ultrasound.   Follow-Up Instructions: No follow-ups on file.   Orders:  No orders of the defined types were placed in this encounter.  No orders of the defined types were placed in this encounter.     Procedures: No procedures performed   Clinical Data: No additional findings.   Subjective: Chief Complaint  Patient presents with   Middle Back - Pain    76 year old female with lumbar spinal stenosis at L2-3, had hip replacements and has undergone previous lumbar surgeries and standing and walking is with weakness and pain that may persist for several days. She reports that she is reluctant to consider surgery due to concerns of possibly having a stroke or other complication due to carotid artery  Stenosis.    Review of Systems  Constitutional: Negative.   HENT: Negative.    Eyes: Negative.   Respiratory: Negative.    Cardiovascular: Negative.   Gastrointestinal: Negative.   Endocrine: Negative.   Genitourinary: Negative.   Musculoskeletal: Negative.   Skin: Negative.    Allergic/Immunologic: Negative.   Neurological: Negative.   Hematological: Negative.   Psychiatric/Behavioral: Negative.      Objective: Vital Signs: BP (!) 109/54    Pulse (!) 126    Ht 5' 3.5" (1.613 m)    Wt 144 lb (65.3 kg)    BMI 25.11 kg/m   Physical Exam Constitutional:      Appearance: She is well-developed.  HENT:     Head: Normocephalic and atraumatic.  Eyes:     Pupils: Pupils are equal, round, and reactive to light.  Pulmonary:     Effort: Pulmonary effort is normal.     Breath sounds: Normal breath sounds.  Abdominal:     General: Bowel sounds are normal.     Palpations: Abdomen is soft.  Musculoskeletal:     Cervical back: Normal range of motion and neck supple.  Skin:    General: Skin is warm and dry.  Neurological:     Mental Status: She is alert and oriented to person, place, and time.  Psychiatric:        Behavior: Behavior normal.        Thought Content: Thought content normal.        Judgment: Judgment normal.    Back Exam   Tenderness  The patient is experiencing tenderness in the cervical, thoracic and lumbar.  Range of Motion  Extension:  abnormal  Lateral bend right:  abnormal  Lateral bend left:  abnormal  Rotation right:  abnormal  Rotation left:  abnormal   Muscle Strength  Right Quadriceps:  5/5  Left Quadriceps:  5/5  Right Hamstrings:  5/5  Left Hamstrings:  5/5   Reflexes  Patellar:  2/4 Achilles:  2/4 Biceps:  2/4 Babinski's sign: normal      Specialty Comments:  No specialty comments available.  Imaging: No results found.   PMFS History: Patient Active Problem List   Diagnosis Date Noted   GERD (gastroesophageal reflux disease) 03/10/2021   IFG (impaired fasting glucose) 01/20/2021   Inflammatory pain 12/05/2020   Lumbar post-laminectomy syndrome 09/07/2020   Chronic constipation 02/09/2019   Heme + stool 02/01/2019   Therapeutic opioid-induced constipation (OIC) 02/01/2019   Prediabetes 12/21/2018    Altered mental status 08/24/2018   Osteoarthritis of left knee 07/21/2017   Chronic pain syndrome 05/10/2017   Degeneration of lumbar intervertebral disc 05/10/2017   Long-term current use of opiate analgesic 05/10/2017   Atherosclerosis of coronary artery bypass graft of native heart without angina pectoris 04/30/2015   Bilateral carotid artery stenosis 04/30/2015   Essential hypertension 04/30/2015   GAD (generalized anxiety disorder) 04/30/2015   Generalized osteoarthritis 04/30/2015   Mixed hyperlipidemia 04/30/2015   Nicotine addiction 04/30/2015   Nodule of right lung 04/30/2015   Recurrent major depressive disorder, in partial remission ([place]) 04/30/2015   Spinal stenosis 04/30/2015   Cough 29/79/8921   Follicular lymphoma grade i, 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 No pertinent family history.  History reviewed. No pertinent surgical history. Social History   Occupational History   Not on file  Tobacco Use   Smoking status: Never   Smokeless tobacco: Never  Substance and Sexual Activity   Alcohol use: Not Currently   Drug use: Never   Sexual activity: Not on file

## 2021-04-17 NOTE — Patient Instructions (Signed)
Avoid frequent bending and stooping  No lifting greater than 10 lbs. May use ice or moist heat for pain. Weight loss is of benefit. Best medication for lumbar disc disease is arthritis medications like motrin, celebrex and naprosyn. Exercise is important to improve your indurance and does allow people to function better inspite of back pain.  For the thoracic spine I recommend physical therapy with core strengthening, heat massage and ultrasound.

## 2021-06-05 ENCOUNTER — Ambulatory Visit: Payer: Medicare Other | Admitting: Specialist

## 2021-06-30 ENCOUNTER — Telehealth: Payer: Self-pay | Admitting: Specialist

## 2021-06-30 ENCOUNTER — Ambulatory Visit: Payer: Medicare Other | Admitting: Specialist

## 2021-06-30 NOTE — Telephone Encounter (Signed)
Pt called and really wants to talk to someone about her surgery.  ? ?CB [phone]  ?

## 2021-07-02 NOTE — Telephone Encounter (Signed)
I called her and lmom for her to call the office back and make an appointment to see Dr. Louanne Skye so he can answer ALL her questions. ?

## 2021-07-21 ENCOUNTER — Encounter: Payer: Self-pay | Admitting: Specialist

## 2021-07-21 ENCOUNTER — Ambulatory Visit (INDEPENDENT_AMBULATORY_CARE_PROVIDER_SITE_OTHER): Payer: Medicare Other | Admitting: Specialist

## 2021-07-21 VITALS — BP 87/52 | HR 52 | Ht 63.0 in | Wt 147.0 lb

## 2021-07-21 DIAGNOSIS — M4802 Spinal stenosis, cervical region: Secondary | ICD-10-CM

## 2021-07-21 DIAGNOSIS — M542 Cervicalgia: Secondary | ICD-10-CM | POA: Diagnosis not present

## 2021-07-21 DIAGNOSIS — M47814 Spondylosis without myelopathy or radiculopathy, thoracic region: Secondary | ICD-10-CM

## 2021-07-21 DIAGNOSIS — M546 Pain in thoracic spine: Secondary | ICD-10-CM

## 2021-07-21 DIAGNOSIS — M545 Low back pain, unspecified: Secondary | ICD-10-CM

## 2021-07-21 DIAGNOSIS — M48062 Spinal stenosis, lumbar region with neurogenic claudication: Secondary | ICD-10-CM

## 2021-07-21 DIAGNOSIS — Z981 Arthrodesis status: Secondary | ICD-10-CM

## 2021-07-21 DIAGNOSIS — M4316 Spondylolisthesis, lumbar region: Secondary | ICD-10-CM

## 2021-07-21 NOTE — Patient Instructions (Signed)
Avoid frequent bending and stooping  No lifting greater than 10 lbs. May use ice or moist heat for pain. Weight loss is of benefit. Exercise is important to improve your indurance and does allow people to function better inspite of back pain. Surgical treatment will improve your standing and walking tolerance.

## 2021-07-21 NOTE — Progress Notes (Signed)
Office Visit Note   Patient: [name]           Date of Birth: 1945/03/27           MRN: [id number] Visit Date: 07/21/2021              Requested by: Karleen Hampshire., MD [address] [place],  [place] [address] PCP: Karleen Hampshire., MD   Assessment & Plan: Visit Diagnoses:  1. Spinal stenosis of lumbar region with neurogenic claudication   2. Spinal stenosis of cervical region   3. Thoracic spondylosis   4. Cervicalgia   5. Pain in thoracic spine   6. Status post cervical spinal fusion   7. Low back pain, unspecified back pain laterality, unspecified chronicity, unspecified whether sciatica present   8. Spondylolisthesis, lumbar region     Plan: Avoid frequent bending and stooping  No lifting greater than 10 lbs. May use ice or moist heat for pain. Weight loss is of benefit. Exercise is important to improve your indurance and does allow people to function better inspite of back pain. Surgical treatment will improve your standing and walking tolerance.  Follow-Up Instructions: Return in about 4 weeks (around 08/18/2021).   Orders:  No orders of the defined types were placed in this encounter.  No orders of the defined types were placed in this encounter.     Procedures: No procedures performed   Clinical Data: No additional findings.   Subjective: Chief Complaint  Patient presents with   Lower Back - Follow-up    Has a lot of questions about surgery.     76 year old female with history of lumbar spinal stenosis. She does not want surgery and is having a  [place] deal of dfficulties with home situation. No bowel or bladder difficulty. She has studies showing severe spinal stenosis.   Review of Systems  Constitutional: Negative.   HENT: Negative.    Eyes: Negative.   Respiratory: Negative.    Cardiovascular: Negative.   Gastrointestinal: Negative.   Endocrine: Negative.   Genitourinary: Negative.   Musculoskeletal: Negative.   Skin:  Negative.   Allergic/Immunologic: Negative.   Neurological: Negative.   Hematological: Negative.   Psychiatric/Behavioral: Negative.      Objective: Vital Signs: BP (!) 87/52 (BP Location: Left Arm, Patient Position: Sitting)   Pulse (!) 52   Ht '5\' 3"'$  (1.6 m)   Wt 147 lb (66.7 kg)   BMI 26.04 kg/m   Physical Exam Constitutional:      Appearance: She is well-developed.  HENT:     Head: Normocephalic and atraumatic.  Eyes:     Pupils: Pupils are equal, round, and reactive to light.  Pulmonary:     Effort: Pulmonary effort is normal.     Breath sounds: Normal breath sounds.  Abdominal:     General: Bowel sounds are normal.     Palpations: Abdomen is soft.  Musculoskeletal:        General: Normal range of motion.     Cervical back: Normal range of motion and neck supple.  Skin:    General: Skin is warm and dry.  Neurological:     Mental Status: She is alert and oriented to person, place, and time.  Psychiatric:        Behavior: Behavior normal.        Thought Content: Thought content normal.        Judgment: Judgment normal.    Back Exam  Tenderness  The patient is experiencing tenderness in the lumbar.  Muscle Strength  Right Quadriceps:  5/5  Left Quadriceps:  5/5  Right Hamstrings:  5/5  Left Hamstrings:  5/5   Reflexes  Patellar:  2/4 Achilles:  2/4     Specialty Comments:  No specialty comments available.  Imaging: No results found.   PMFS History: Patient Active Problem List   Diagnosis Date Noted   GERD (gastroesophageal reflux disease) 03/10/2021   IFG (impaired fasting glucose) 01/20/2021   Inflammatory pain 12/05/2020   Lumbar post-laminectomy syndrome 09/07/2020   Chronic constipation 02/09/2019   Heme + stool 02/01/2019   Therapeutic opioid-induced constipation (OIC) 02/01/2019   Prediabetes 12/21/2018   Altered mental status 08/24/2018   Osteoarthritis of left knee 07/21/2017   Chronic pain syndrome 05/10/2017   Degeneration of  lumbar intervertebral disc 05/10/2017   Long-term current use of opiate analgesic 05/10/2017   Atherosclerosis of coronary artery bypass graft of native heart without angina pectoris 04/30/2015   Bilateral carotid artery stenosis 04/30/2015   Essential hypertension 04/30/2015   GAD (generalized anxiety disorder) 04/30/2015   Generalized osteoarthritis 04/30/2015   Mixed hyperlipidemia 04/30/2015   Nicotine addiction 04/30/2015   Nodule of right lung 04/30/2015   Recurrent major depressive disorder, in partial remission ([place]) 04/30/2015   Spinal stenosis 04/30/2015   Cough 96/78/9381   Follicular lymphoma grade i, 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 No pertinent family history.  History reviewed. No pertinent surgical history. Social History   Occupational History   Not on file  Tobacco Use   Smoking status: Never   Smokeless tobacco: Never  Substance and Sexual Activity   Alcohol use: Not Currently   Drug use: Never   Sexual activity: Not on file

## 2021-07-31 DIAGNOSIS — L720 Epidermal cyst: Secondary | ICD-10-CM | POA: Insufficient documentation

## 2021-08-01 ENCOUNTER — Telehealth: Payer: Self-pay | Admitting: Specialist

## 2021-08-01 NOTE — Telephone Encounter (Signed)
Patient called advised she started having shortness of breath which started two nights ago. I advised patient to go to the emergency room. Patient said she is seeing her PCP on Monday. Patient said she is having shortness of breath now. Patient said her daughter in law is there with her. Patient said she is afraid to have surgery at this time. The number to contact patient is [phone]

## 2021-08-01 NOTE — Telephone Encounter (Signed)
I called and advised that she needs to go to the ER, she states that she doesn't know what they would do for her there, I advised that she could have developed a blood clot in her lungs causing the shortness of breath, I told her that there are some tests that they will do for her to find out what is going on, I advised that she should not wait till she see her PCP on Monday that she should go now, and she said ok that she would.

## 2021-08-02 ENCOUNTER — Emergency Department (HOSPITAL_COMMUNITY): Payer: Medicare Other

## 2021-08-02 ENCOUNTER — Encounter (HOSPITAL_COMMUNITY)
Admission: EM | Disposition: A | Discharge status: Home / Self Care | Payer: Self-pay | Source: Home / Self Care | Attending: Internal Medicine

## 2021-08-02 ENCOUNTER — Encounter (HOSPITAL_COMMUNITY): Payer: Self-pay

## 2021-08-02 ENCOUNTER — Inpatient Hospital Stay (HOSPITAL_COMMUNITY)
Admission: EM | Admit: 2021-08-02 | Discharge: 2021-08-09 | DRG: 246 | Disposition: A | Discharge status: Home / Self Care | Payer: Medicare Other | Attending: Internal Medicine | Admitting: Internal Medicine

## 2021-08-02 ENCOUNTER — Other Ambulatory Visit: Payer: Self-pay

## 2021-08-02 ENCOUNTER — Inpatient Hospital Stay (HOSPITAL_COMMUNITY): Payer: Medicare Other

## 2021-08-02 DIAGNOSIS — R0789 Other chest pain: Secondary | ICD-10-CM | POA: Diagnosis present

## 2021-08-02 DIAGNOSIS — E7800 Pure hypercholesterolemia, unspecified: Secondary | ICD-10-CM | POA: Diagnosis present

## 2021-08-02 DIAGNOSIS — I2109 ST elevation (STEMI) myocardial infarction involving other coronary artery of anterior wall: Principal | ICD-10-CM | POA: Diagnosis present

## 2021-08-02 DIAGNOSIS — I509 Heart failure, unspecified: Secondary | ICD-10-CM | POA: Diagnosis not present

## 2021-08-02 DIAGNOSIS — F17210 Nicotine dependence, cigarettes, uncomplicated: Secondary | ICD-10-CM | POA: Diagnosis present

## 2021-08-02 DIAGNOSIS — I213 ST elevation (STEMI) myocardial infarction of unspecified site: Secondary | ICD-10-CM | POA: Diagnosis not present

## 2021-08-02 DIAGNOSIS — I2101 ST elevation (STEMI) myocardial infarction involving left main coronary artery: Secondary | ICD-10-CM | POA: Diagnosis not present

## 2021-08-02 DIAGNOSIS — E785 Hyperlipidemia, unspecified: Secondary | ICD-10-CM

## 2021-08-02 DIAGNOSIS — G894 Chronic pain syndrome: Secondary | ICD-10-CM | POA: Diagnosis present

## 2021-08-02 DIAGNOSIS — Z8572 Personal history of non-Hodgkin lymphomas: Secondary | ICD-10-CM | POA: Diagnosis not present

## 2021-08-02 DIAGNOSIS — R079 Chest pain, unspecified: Secondary | ICD-10-CM | POA: Diagnosis not present

## 2021-08-02 DIAGNOSIS — I2510 Atherosclerotic heart disease of native coronary artery without angina pectoris: Secondary | ICD-10-CM

## 2021-08-02 DIAGNOSIS — Z7982 Long term (current) use of aspirin: Secondary | ICD-10-CM

## 2021-08-02 DIAGNOSIS — Z20822 Contact with and (suspected) exposure to covid-19: Secondary | ICD-10-CM | POA: Diagnosis present

## 2021-08-02 DIAGNOSIS — N179 Acute kidney failure, unspecified: Secondary | ICD-10-CM | POA: Diagnosis not present

## 2021-08-02 DIAGNOSIS — Z79899 Other long term (current) drug therapy: Secondary | ICD-10-CM | POA: Diagnosis not present

## 2021-08-02 DIAGNOSIS — G4700 Insomnia, unspecified: Secondary | ICD-10-CM | POA: Diagnosis present

## 2021-08-02 DIAGNOSIS — I255 Ischemic cardiomyopathy: Secondary | ICD-10-CM | POA: Diagnosis present

## 2021-08-02 DIAGNOSIS — I513 Intracardiac thrombosis, not elsewhere classified: Secondary | ICD-10-CM | POA: Diagnosis present

## 2021-08-02 DIAGNOSIS — I10 Essential (primary) hypertension: Secondary | ICD-10-CM | POA: Diagnosis not present

## 2021-08-02 DIAGNOSIS — I2129 ST elevation (STEMI) myocardial infarction involving other sites: Secondary | ICD-10-CM | POA: Diagnosis not present

## 2021-08-02 DIAGNOSIS — M48062 Spinal stenosis, lumbar region with neurogenic claudication: Secondary | ICD-10-CM | POA: Diagnosis present

## 2021-08-02 DIAGNOSIS — I5021 Acute systolic (congestive) heart failure: Secondary | ICD-10-CM | POA: Diagnosis present

## 2021-08-02 DIAGNOSIS — I2102 ST elevation (STEMI) myocardial infarction involving left anterior descending coronary artery: Secondary | ICD-10-CM | POA: Diagnosis not present

## 2021-08-02 DIAGNOSIS — F419 Anxiety disorder, unspecified: Secondary | ICD-10-CM | POA: Diagnosis present

## 2021-08-02 DIAGNOSIS — I5020 Unspecified systolic (congestive) heart failure: Secondary | ICD-10-CM | POA: Diagnosis not present

## 2021-08-02 DIAGNOSIS — M4802 Spinal stenosis, cervical region: Secondary | ICD-10-CM | POA: Diagnosis present

## 2021-08-02 DIAGNOSIS — I110 Hypertensive heart disease with heart failure: Secondary | ICD-10-CM | POA: Diagnosis present

## 2021-08-02 DIAGNOSIS — I5022 Chronic systolic (congestive) heart failure: Secondary | ICD-10-CM | POA: Diagnosis not present

## 2021-08-02 DIAGNOSIS — I959 Hypotension, unspecified: Secondary | ICD-10-CM | POA: Diagnosis not present

## 2021-08-02 DIAGNOSIS — I2111 ST elevation (STEMI) myocardial infarction involving right coronary artery: Secondary | ICD-10-CM | POA: Diagnosis not present

## 2021-08-02 DIAGNOSIS — F32A Depression, unspecified: Secondary | ICD-10-CM | POA: Diagnosis present

## 2021-08-02 DIAGNOSIS — Z79891 Long term (current) use of opiate analgesic: Secondary | ICD-10-CM | POA: Diagnosis not present

## 2021-08-02 DIAGNOSIS — I237 Postinfarction angina: Secondary | ICD-10-CM | POA: Diagnosis not present

## 2021-08-02 DIAGNOSIS — Z720 Tobacco use: Secondary | ICD-10-CM | POA: Diagnosis not present

## 2021-08-02 HISTORY — PX: LEFT HEART CATH AND CORONARY ANGIOGRAPHY: CATH118249

## 2021-08-02 HISTORY — DX: Essential (primary) hypertension: I10

## 2021-08-02 LAB — CBC WITH DIFFERENTIAL/PLATELET
Abs Immature Granulocytes: 0.01 10*3/uL (ref 0.00–0.07)
Basophils Absolute: 0 10*3/uL (ref 0.0–0.1)
Basophils Relative: 1 %
Eosinophils Absolute: 0.2 10*3/uL (ref 0.0–0.5)
Eosinophils Relative: 4 %
HCT: 40.6 % (ref 36.0–46.0)
Hemoglobin: 13.4 g/dL (ref 12.0–15.0)
Immature Granulocytes: 0 %
Lymphocytes Relative: 19 %
Lymphs Abs: 1.2 10*3/uL (ref 0.7–4.0)
MCH: 30.5 pg (ref 26.0–34.0)
MCHC: 33 g/dL (ref 30.0–36.0)
MCV: 92.5 fL (ref 80.0–100.0)
Monocytes Absolute: 0.7 10*3/uL (ref 0.1–1.0)
Monocytes Relative: 11 %
Neutro Abs: 4.2 10*3/uL (ref 1.7–7.7)
Neutrophils Relative %: 65 %
Platelets: 151 10*3/uL (ref 150–400)
RBC: 4.39 MIL/uL (ref 3.87–5.11)
RDW: 13.9 % (ref 11.5–15.5)
WBC: 6.4 10*3/uL (ref 4.0–10.5)
nRBC: 0 % (ref 0.0–0.2)

## 2021-08-02 LAB — PROTIME-INR
INR: 0.9 (ref 0.8–1.2)
Prothrombin Time: 12.2 seconds (ref 11.4–15.2)

## 2021-08-02 LAB — COMPREHENSIVE METABOLIC PANEL
ALT: 12 U/L (ref 0–44)
AST: 18 U/L (ref 15–41)
Albumin: 3.7 g/dL (ref 3.5–5.0)
Alkaline Phosphatase: 47 U/L (ref 38–126)
Anion gap: 4 — ABNORMAL LOW (ref 5–15)
BUN: 20 mg/dL (ref 8–23)
CO2: 29 mmol/L (ref 22–32)
Calcium: 8.8 mg/dL — ABNORMAL LOW (ref 8.9–10.3)
Chloride: 107 mmol/L (ref 98–111)
Creatinine, Ser: 0.92 mg/dL (ref 0.44–1.00)
GFR, Estimated: >60 mL/min (ref >60)
Glucose, Bld: 143 mg/dL — ABNORMAL HIGH (ref 70–99)
Potassium: 3.7 mmol/L (ref 3.5–5.1)
Sodium: 140 mmol/L (ref 135–145)
Total Bilirubin: 0.6 mg/dL (ref 0.3–1.2)
Total Protein: 6.3 g/dL — ABNORMAL LOW (ref 6.5–8.1)

## 2021-08-02 LAB — LIPID PANEL
Cholesterol: 153 mg/dL (ref 0–200)
HDL: 48 mg/dL (ref >40)
LDL Cholesterol: 80 mg/dL (ref 0–99)
Total CHOL/HDL Ratio: 3.2 RATIO
Triglycerides: 124 mg/dL (ref <150)
VLDL: 25 mg/dL (ref 0–40)

## 2021-08-02 LAB — MAGNESIUM: Magnesium: 2.2 mg/dL (ref 1.7–2.4)

## 2021-08-02 LAB — RESP PANEL BY RT-PCR (FLU A&B, COVID) ARPGX2
Influenza A by PCR: NEGATIVE
Influenza B by PCR: NEGATIVE
SARS Coronavirus 2 by RT PCR: NEGATIVE

## 2021-08-02 LAB — GLUCOSE, CAPILLARY: Glucose-Capillary: 146 mg/dL — ABNORMAL HIGH (ref 70–99)

## 2021-08-02 LAB — HEMOGLOBIN A1C
Hgb A1c MFr Bld: 5.6 % (ref 4.8–5.6)
Mean Plasma Glucose: 114.02 mg/dL

## 2021-08-02 LAB — TROPONIN I (HIGH SENSITIVITY)
Troponin I (High Sensitivity): 53 ng/L — ABNORMAL HIGH (ref <18)
Troponin I (High Sensitivity): 78 ng/L — ABNORMAL HIGH (ref <18)

## 2021-08-02 LAB — APTT: aPTT: 26 seconds (ref 24–36)

## 2021-08-02 LAB — LACTIC ACID, PLASMA: Lactic Acid, Venous: 1.4 mmol/L (ref 0.5–1.9)

## 2021-08-02 SURGERY — LEFT HEART CATH AND CORONARY ANGIOGRAPHY
Anesthesia: LOCAL

## 2021-08-02 MED ORDER — LORAZEPAM 2 MG/ML IJ SOLN
INTRAMUSCULAR | Status: AC
  Administered 2021-08-02: 1 mg via INTRAVENOUS
  Filled 2021-08-02: qty 1

## 2021-08-02 MED ORDER — HEPARIN (PORCINE) IN NACL 1000-0.9 UT/500ML-% IV SOLN
INTRAVENOUS | Status: DC | PRN
  Administered 2021-08-02 (×2): 500 mL

## 2021-08-02 MED ORDER — ASPIRIN 81 MG PO CHEW
324.0000 mg | CHEWABLE_TABLET | Freq: Once | ORAL | Status: AC
  Administered 2021-08-02: 324 mg via ORAL
  Filled 2021-08-02: qty 4

## 2021-08-02 MED ORDER — SODIUM CHLORIDE 0.9% FLUSH
3.0000 mL | Freq: Two times a day (BID) | INTRAVENOUS | Status: DC
  Administered 2021-08-02 – 2021-08-09 (×13): 3 mL via INTRAVENOUS

## 2021-08-02 MED ORDER — MIDAZOLAM HCL 2 MG/2ML IJ SOLN
INTRAMUSCULAR | Status: DC | PRN
  Administered 2021-08-02: 1 mg via INTRAVENOUS

## 2021-08-02 MED ORDER — FUROSEMIDE 10 MG/ML IJ SOLN
40.0000 mg | Freq: Two times a day (BID) | INTRAMUSCULAR | Status: DC
  Administered 2021-08-03 – 2021-08-04 (×3): 40 mg via INTRAVENOUS
  Filled 2021-08-02 (×3): qty 4

## 2021-08-02 MED ORDER — HYDRALAZINE HCL 20 MG/ML IJ SOLN
10.0000 mg | INTRAMUSCULAR | Status: AC | PRN
Start: 2021-08-02 — End: 2021-08-03

## 2021-08-02 MED ORDER — FENTANYL CITRATE (PF) 100 MCG/2ML IJ SOLN
INTRAMUSCULAR | Status: AC
  Filled 2021-08-02: qty 2

## 2021-08-02 MED ORDER — VERAPAMIL HCL 2.5 MG/ML IV SOLN
INTRAVENOUS | Status: AC
  Filled 2021-08-02: qty 2

## 2021-08-02 MED ORDER — VERAPAMIL HCL 2.5 MG/ML IV SOLN
INTRAVENOUS | Status: DC | PRN
  Administered 2021-08-02: 10 mL via INTRA_ARTERIAL

## 2021-08-02 MED ORDER — MIDAZOLAM HCL 2 MG/2ML IJ SOLN
INTRAMUSCULAR | Status: AC
  Filled 2021-08-02: qty 2

## 2021-08-02 MED ORDER — HEPARIN SODIUM (PORCINE) 1000 UNIT/ML IJ SOLN
INTRAMUSCULAR | Status: DC | PRN
  Administered 2021-08-02: 3000 [IU] via INTRAVENOUS

## 2021-08-02 MED ORDER — LABETALOL HCL 5 MG/ML IV SOLN: 10.0000 mg | INTRAVENOUS | Status: AC | PRN

## 2021-08-02 MED ORDER — FUROSEMIDE 10 MG/ML IJ SOLN
INTRAMUSCULAR | Status: DC | PRN
  Administered 2021-08-02: 40 mg via INTRAVENOUS

## 2021-08-02 MED ORDER — SODIUM CHLORIDE 0.9 % IV SOLN: INTRAVENOUS | Status: DC

## 2021-08-02 MED ORDER — NITROGLYCERIN IN D5W 200-5 MCG/ML-% IV SOLN
0.0000 ug/min | INTRAVENOUS | Status: DC
  Administered 2021-08-02: 160 ug/min via INTRAVENOUS
  Filled 2021-08-02: qty 250

## 2021-08-02 MED ORDER — LORAZEPAM 2 MG/ML IJ SOLN
1.0000 mg | Freq: Once | INTRAMUSCULAR | Status: AC
Start: 2021-08-02 — End: 2021-08-02

## 2021-08-02 MED ORDER — HEPARIN (PORCINE) IN NACL 1000-0.9 UT/500ML-% IV SOLN
INTRAVENOUS | Status: AC
  Filled 2021-08-02: qty 1000

## 2021-08-02 MED ORDER — LIDOCAINE HCL (PF) 1 % IJ SOLN
INTRAMUSCULAR | Status: AC
  Filled 2021-08-02: qty 30

## 2021-08-02 MED ORDER — SODIUM CHLORIDE 0.9 % IV SOLN: 250.0000 mL | INTRAVENOUS | Status: DC | PRN

## 2021-08-02 MED ORDER — HEPARIN SODIUM (PORCINE) 5000 UNIT/ML IJ SOLN
4000.0000 [IU] | Freq: Once | INTRAMUSCULAR | Status: AC
  Administered 2021-08-02: 4000 [IU] via INTRAVENOUS
  Filled 2021-08-02: qty 1

## 2021-08-02 MED ORDER — SODIUM CHLORIDE 0.9% FLUSH
3.0000 mL | INTRAVENOUS | Status: DC | PRN
  Administered 2021-08-03: 3 mL via INTRAVENOUS

## 2021-08-02 MED ORDER — HYDRALAZINE HCL 20 MG/ML IJ SOLN: 10.0000 mg | Freq: Once | INTRAMUSCULAR | Status: AC

## 2021-08-02 MED ORDER — LIDOCAINE HCL (PF) 1 % IJ SOLN
INTRAMUSCULAR | Status: DC | PRN
  Administered 2021-08-02: 2 mL via INTRADERMAL

## 2021-08-02 MED ORDER — ONDANSETRON HCL 4 MG/2ML IJ SOLN: 4.0000 mg | Freq: Four times a day (QID) | INTRAMUSCULAR | Status: DC | PRN

## 2021-08-02 MED ORDER — FENTANYL CITRATE (PF) 100 MCG/2ML IJ SOLN
INTRAMUSCULAR | Status: DC | PRN
  Administered 2021-08-02: 25 ug via INTRAVENOUS

## 2021-08-02 MED ORDER — HEPARIN SODIUM (PORCINE) 1000 UNIT/ML IJ SOLN
INTRAMUSCULAR | Status: AC
  Filled 2021-08-02: qty 10

## 2021-08-02 MED ORDER — NITROGLYCERIN 0.4 MG SL SUBL
0.4000 mg | SUBLINGUAL_TABLET | SUBLINGUAL | Status: DC | PRN
  Administered 2021-08-08: 0.4 mg via SUBLINGUAL
  Filled 2021-08-02 (×2): qty 1

## 2021-08-02 MED ORDER — HYDRALAZINE HCL 20 MG/ML IJ SOLN
INTRAMUSCULAR | Status: AC
  Administered 2021-08-02: 20 mg
  Filled 2021-08-02: qty 1

## 2021-08-02 MED ORDER — LORAZEPAM 2 MG/ML IJ SOLN: 0.5000 mg | Freq: Once | INTRAMUSCULAR | Status: AC

## 2021-08-02 MED ORDER — FUROSEMIDE 10 MG/ML IJ SOLN
40.0000 mg | Freq: Once | INTRAMUSCULAR | Status: AC
Start: 2021-08-02 — End: 2021-08-02
  Administered 2021-08-02: 40 mg via INTRAVENOUS
  Filled 2021-08-02: qty 4

## 2021-08-02 MED ORDER — IOHEXOL 350 MG/ML SOLN
INTRAVENOUS | Status: DC | PRN
  Administered 2021-08-02: 65 mL via INTRA_ARTERIAL

## 2021-08-02 MED ORDER — POTASSIUM CHLORIDE CRYS ER 20 MEQ PO TBCR
40.0000 meq | EXTENDED_RELEASE_TABLET | Freq: Once | ORAL | Status: AC
  Administered 2021-08-03: 40 meq via ORAL
  Filled 2021-08-02: qty 2

## 2021-08-02 MED ORDER — ASPIRIN 81 MG PO TBEC
81.0000 mg | DELAYED_RELEASE_TABLET | Freq: Every day | ORAL | Status: DC
  Administered 2021-08-03 – 2021-08-09 (×6): 81 mg via ORAL
  Filled 2021-08-02 (×8): qty 1

## 2021-08-02 MED ORDER — ACETAMINOPHEN 325 MG PO TABS: 650.0000 mg | ORAL_TABLET | ORAL | Status: DC | PRN

## 2021-08-02 MED ORDER — ATORVASTATIN CALCIUM 80 MG PO TABS
80.0000 mg | ORAL_TABLET | Freq: Every day | ORAL | Status: DC
  Administered 2021-08-03 – 2021-08-08 (×6): 80 mg via ORAL
  Filled 2021-08-02 (×8): qty 1

## 2021-08-02 MED ORDER — LORAZEPAM 2 MG/ML IJ SOLN
INTRAMUSCULAR | Status: AC
  Administered 2021-08-02: 0.5 mg via INTRAVENOUS
  Filled 2021-08-02: qty 1

## 2021-08-02 SURGICAL SUPPLY — 13 items
BAND CMPR LRG ZPHR (HEMOSTASIS) ×1
BAND ZEPHYR COMPRESS 30 LONG (HEMOSTASIS) ×1 IMPLANT
CATH INFINITI 5FR ANG PIGTAIL (CATHETERS) ×1 IMPLANT
CATH OPTITORQUE TIG 4.0 5F (CATHETERS) ×1 IMPLANT
GLIDESHEATH SLEND SS 6F .021 (SHEATH) ×1 IMPLANT
GUIDEWIRE INQWIRE 1.5J.035X260 (WIRE) IMPLANT
INQWIRE 1.5J .035X260CM (WIRE) ×4
KIT ENCORE 26 ADVANTAGE (KITS) ×1 IMPLANT
KIT HEART LEFT (KITS) ×3 IMPLANT
PACK CARDIAC CATHETERIZATION (CUSTOM PROCEDURE TRAY) ×3 IMPLANT
SYR MEDRAD MARK 7 150ML (SYRINGE) ×3 IMPLANT
TRANSDUCER W/STOPCOCK (MISCELLANEOUS) ×3 IMPLANT
TUBING CIL FLEX 10 FLL-RA (TUBING) ×3 IMPLANT

## 2021-08-02 NOTE — Progress Notes (Signed)
Chaplain responding to page for pt [name] transferring from [facility] due to code STEMI. Pt unavailable at time of visit; no family present at this time.  Chaplain services remain available for follow-up spiritual/emotional support.  [place], [place]      08/02/21 2000  Clinical Encounter Type  Visited With Patient not available  Visit Type Initial;ED;Code  Referral From Nurse

## 2021-08-02 NOTE — ED Notes (Signed)
Attempted x 2 to call report to [facility] cath lab, unsuccessful. This RN spoke to [facility] ED charge RN, Annamary Carolin, about expected STEMI pt going straight to cath lab via [facility].

## 2021-08-02 NOTE — ED Provider Notes (Signed)
This is a 76 year old female, she has a history of anxiety on Xanax, depression on Cymbalta, hypertension on lisinopril and metoprolol and Zocor for hypercholesterolemia.  She smokes cigarettes, longtime smoker, presents with about a week of shortness of breath and chest pain which is intermittent, she has been having worsening symptoms over the week but it does appear to be fluctuating and intermittent, on exam has clear heart and lung sounds, no significant edema of the legs, vital signs are rather unremarkable however her EKG is concerning showing some signs of ST elevation in the inferior leads primarily lead II and F as well as ST elevation in V3 V4 V5 and V6, code STEMI was activated on arrival, patient critically ill, will be in need of transfer to cardiac center, will discuss with cardiology shortly  Discussed with Dr. And who has accepted the patient in transfer to [facility] and will go straight to heart catheterization.  .Critical Care Performed by: Noemi Chapel, MD Authorized by: Noemi Chapel, MD   Critical care provider statement:    Critical care time (minutes):  35   Critical care time was exclusive of:  Separately billable procedures and treating other patients and teaching time   Critical care was necessary to treat or prevent imminent or life-threatening deterioration of the following conditions:  Cardiac failure   Critical care was time spent personally by me on the following activities:  Development of treatment plan with patient or surrogate, discussions with consultants, evaluation of patient's response to treatment, examination of patient, ordering and review of laboratory studies, ordering and review of radiographic studies, ordering and performing treatments and interventions, pulse oximetry, re-evaluation of patient's condition and review of old charts   I assumed direction of critical care for this patient from another provider in my specialty: no     Care  discussed with: admitting provider      EKG Interpretation  Date/Time:  Saturday August 02 2021 18:47:56 EDT Ventricular Rate:  76 PR Interval:  168 QRS Duration: 103 QT Interval:  381 QTC Calculation: 429 R Axis:   15 Text Interpretation: Sinus rhythm Probable anteroseptal infarct, recent ST elevation, consider inferior injury Lateral leads are also involved new changes found, ST abnoermalities Confirmed by Noemi Chapel [phone]) on 08/02/2021 6:54:50 PM        Final diagnoses:  ST elevation myocardial infarction (STEMI) involving other coronary artery ([place])      Noemi Chapel, MD 08/02/21 1916

## 2021-08-02 NOTE — ED Provider Notes (Signed)
[facility] EMERGENCY DEPARTMENT Provider Note   CSN: [id number] Arrival date & time: 08/02/21  1830     History  Chief Complaint  Patient presents with   Shortness of [name] is a 76 y.o. female.  HPI Patient is a 76 year old female with a history of smoking, hyperlipidemia, hypertension, who presents to the emergency department due to shortness of breath.  She states that last week she was experiencing intermittent episodes of central chest pain that would radiate to the right side of her chest.  She then began developing shortness of breath over the past 5 to 6 days.  States it is worse at night.  Denies any lower extremity swelling.  Reports nausea but no vomiting or diaphoresis.  No history of MI or stenting in the past.    Home Medications Prior to Admission medications   Medication Sig Start Date End Date Taking? Authorizing Provider  ALPRAZolam Duanne Moron) 0.5 MG tablet Take 0.5 mg by mouth 2 (two) times daily as needed. 10/26/20   [provider]  chlorthalidone (HYGROTON) 25 MG tablet chlorthalidone 25 mg tablet 11/13/15   [provider]  diclofenac Sodium (VOLTAREN) 1 % GEL Voltaren Arthritis Pain 1 % topical gel  APPLY 2 GRAMS TO THE AFFECTED AREA(S) BY TOPICAL ROUTE 4 TIMES PER DAY 08/19/17   [provider]  DULoxetine (CYMBALTA) 30 MG capsule Take 30-60 mg by mouth as directed. 02/20/21   [provider]  gabapentin (NEURONTIN) 800 MG tablet Take 800 mg by mouth 3 (three) times daily. 12/25/20   [provider]  lisinopril (ZESTRIL) 10 MG tablet Take 10 mg by mouth daily. 01/20/21   [provider]  metoprolol succinate (TOPROL-XL) 100 MG 24 hr tablet metoprolol succinate ER 100 mg tablet,extended release 24 hr 05/30/14   [provider]  naproxen (NAPROSYN) 500 MG tablet Take 1 tablet (500 mg total) by mouth 2 (two) times daily with a meal. 02/26/21   Noemi Chapel, MD  oxycodone (ROXICODONE) 30  MG immediate release tablet Take 30 mg by mouth 4 (four) times daily as needed. 11/20/20   [provider]  simvastatin (ZOCOR) 40 MG tablet simvastatin 40 mg tablet 04/13/16   [provider]      Allergies    Morphine    Review of Systems   Review of Systems  All other systems reviewed and are negative. Ten systems reviewed and are negative for acute change, except as noted in the HPI.   Physical Exam Updated Vital Signs BP 132/74   Pulse 73   Temp 99 F (37.2 C)   Ht '5\' 3"'$  (1.6 m)   Wt 68 kg   SpO2 94%   BMI 26.57 kg/m  Physical Exam Vitals and nursing note reviewed.  Constitutional:      General: She is not in acute distress.    Appearance: Normal appearance. She is not ill-appearing, toxic-appearing or diaphoretic.  HENT:     Head: Normocephalic and atraumatic.     Right Ear: External ear normal.     Left Ear: External ear normal.     Nose: Nose normal.     Mouth/Throat:     Mouth: Mucous membranes are moist.     Pharynx: Oropharynx is clear. No oropharyngeal exudate or posterior oropharyngeal erythema.  Eyes:     Extraocular Movements: Extraocular movements intact.  Cardiovascular:     Rate and Rhythm: Normal rate and regular rhythm.     Pulses:  Normal pulses.     Heart sounds: Normal heart sounds. No murmur heard.   No friction rub. No gallop.  Pulmonary:     Effort: Pulmonary effort is normal. No respiratory distress.     Breath sounds: Normal breath sounds. No stridor. No wheezing, rhonchi or rales.  Abdominal:     General: Abdomen is flat.     Tenderness: There is no abdominal tenderness.  Musculoskeletal:        General: Normal range of motion.     Cervical back: Normal range of motion and neck supple. No tenderness.  Skin:    General: Skin is warm and dry.  Neurological:     General: No focal deficit present.     Mental Status: She is alert and oriented to person, place, and time.  Psychiatric:        Mood and Affect: Mood normal.         Behavior: Behavior normal.   ED Results / Procedures / Treatments   Labs (all labs ordered are listed, but only abnormal results are displayed) Labs Reviewed  RESP PANEL BY RT-PCR (FLU A&B, COVID) ARPGX2  CBC WITH DIFFERENTIAL/PLATELET  HEMOGLOBIN A1C  PROTIME-INR  APTT  COMPREHENSIVE METABOLIC PANEL  LIPID PANEL  TROPONIN I (HIGH SENSITIVITY)   EKG EKG Interpretation  Date/Time:  Saturday August 02 2021 18:47:56 EDT Ventricular Rate:  76 PR Interval:  168 QRS Duration: 103 QT Interval:  381 QTC Calculation: 429 R Axis:   15 Text Interpretation: Sinus rhythm Probable anteroseptal infarct, recent ST elevation, consider inferior injury Lateral leads are also involved new changes found, ST abnoermalities Confirmed by Noemi Chapel [phone]) on 08/02/2021 6:54:50 PM  Radiology DG Chest Port 1 View  Result Date: 08/02/2021 CLINICAL DATA:  Shortness of breath beginning 3 nights ago. EXAM: PORTABLE CHEST 1 VIEW COMPARISON:  02/12/2021 FINDINGS: Artifact overlies the chest. Heart size upper limits of normal. Aortic atherosclerosis is present. There is mild interstitial edema and small pleural effusions, suggesting congestive heart failure. Volume loss at the lung bases secondary to the effusions. No acute bone finding. IMPRESSION: Interstitial edema. Bilateral pleural effusions. Basilar volume loss associated with the fusions. Findings consistent with congestive heart failure. Electronically Signed   By: Nelson Chimes M.D.   On: 08/02/2021 19:18    Procedures Procedures   Medications Ordered in ED Medications  0.9 %  sodium chloride infusion ( Intravenous New Bag/Given 08/02/21 1907)  aspirin chewable tablet 324 mg (324 mg Oral Given 08/02/21 1907)  heparin injection 4,000 Units (4,000 Units Intravenous Given 08/02/21 1907)   ED Course/ Medical Decision Making/ A&P Clinical Course as of 08/02/21 1924  Sat Aug 02, 2021  1903 ECG read by my attending physician Dr. Noemi Chapel.  Concerning  for STEMI.  Code STEMI initiated. [LJ]  1923 My attending physician Dr. Noemi Chapel discussed patient's case with Dr. Saunders Revel with cardiology.  Recommends transfer to [facility] for PCI.  This was discussed with the patient as well as her daughter-in-law at bedside who are agreeable with this plan. [LJ]    Clinical Course User Index [LJ] Rayna Sexton, PA-C                           Medical Decision Making Amount and/or Complexity of Data Reviewed Labs: ordered. Radiology: ordered.  Risk OTC drugs. Prescription drug management. Decision regarding hospitalization.   Patient is a 76 year old female who presents to the emergency department due to  chest pain and shortness of breath.  ECG concerning for STEMI.  Code STEMI initiated.  Patient given ASA as well as heparin bolus.  My attending physician discussed patient's case with Dr. Saunders Revel with cardiology.  Recommends transfer to [facility] for likely PCI.  This was discussed with the patient as well as her daughter-in-law at bedside who are agreeable.  EMS has been contacted and is in route to transfer patient to [facility].  Patient appears stable at this time.  We will continue to closely monitor. Final Clinical Impression(s) / ED Diagnoses Final diagnoses:  ST elevation myocardial infarction (STEMI) involving other coronary artery [facility])   Rx / DC Orders ED Discharge Orders     None         Rayna Sexton, PA-C 08/02/21 1925    Noemi Chapel, MD 08/04/21 1240

## 2021-08-02 NOTE — H&P (Addendum)
Cardiology Admission History and Physical:   Patient ID: [name] MRN: [id number]; DOB: 03-22-1945   Admission date: 08/02/2021  Primary Care Provider: Karleen Hampshire., MD [facility] Cardiologist: None  [facility] Electrophysiologist:  None   Chief Complaint: SOB/CP  Patient Profile:   [name] is a 76 y.o. female with HTN, HLD, and tobacco use, who presented to [facility] ED with 3 days of SOB and CP and ECG c/w possible anterolateral STEMI.   History of Present Illness:   [name] reports shortness of breath of 6-day duration that was intermittent along with chest pain of 1 week duration and recent family stressors.  At presentation to the [facility] emergency department she was chest pain free.  She reported intermittent episodes of central chest pain with radiation to her right chest with associated shortness of breath with 5 to 6-day duration.  She denied any emesis or diaphoresis but did have some associated nausea.  She had no prior cardiac evaluation and no prior coronary interventions or surgery.   VS on arrival to [facility] ED P 73, BP 132/74, T 59F, O2 94%/RA  ECG with 0.5 mm ST elevation in lead I, 1 mm in aVF, 1 mm V2-V5, no changes in lead III, reportedly different from prior comparison but not accessible in our system.   CODE STEMI activated and patient was transferred to [facility].   On arrival patient is CP free and reports last episode was around 5 days ago. Primary complaint now is ongoing SOB.   She underwent emergency coronary angiography which showed diffuse multivessel disease without obvious culprit lesion.  PCI was deferred pending CT surgery evaluation for CABG candidacy.  LVEDP 30 mmHg.   Past Medical History:  Diagnosis Date   Arthritis    Hx of non-Hodgkin's lymphoma    Hypertension    No past surgical history on file.   Medications Prior to Admission: Prior to Admission medications   Medication Sig Start Date End Date Taking? Authorizing Provider   ALPRAZolam Duanne Moron) 0.5 MG tablet Take 0.5 mg by mouth 2 (two) times daily as needed. 10/26/20   [provider]  chlorthalidone (HYGROTON) 25 MG tablet chlorthalidone 25 mg tablet 11/13/15   [provider]  diclofenac Sodium (VOLTAREN) 1 % GEL Voltaren Arthritis Pain 1 % topical gel  APPLY 2 GRAMS TO THE AFFECTED AREA(S) BY TOPICAL ROUTE 4 TIMES PER DAY 08/19/17   [provider]  DULoxetine (CYMBALTA) 30 MG capsule Take 30-60 mg by mouth as directed. 02/20/21   [provider]  gabapentin (NEURONTIN) 800 MG tablet Take 800 mg by mouth 3 (three) times daily. 12/25/20   [provider]  lisinopril (ZESTRIL) 10 MG tablet Take 10 mg by mouth daily. 01/20/21   [provider]  metoprolol succinate (TOPROL-XL) 100 MG 24 hr tablet metoprolol succinate ER 100 mg tablet,extended release 24 hr 05/30/14   [provider]  naproxen (NAPROSYN) 500 MG tablet Take 1 tablet (500 mg total) by mouth 2 (two) times daily with a meal. 02/26/21   Noemi Chapel, MD  oxycodone (ROXICODONE) 30 MG immediate release tablet Take 30 mg by mouth 4 (four) times daily as needed. 11/20/20   [provider]  simvastatin (ZOCOR) 40 MG tablet simvastatin 40 mg tablet 04/13/16   [provider]    Allergies:    Allergies  Allergen Reactions   Morphine Rash   Social History:   Social History   Socioeconomic History   Marital status:  Widowed    Spouse name: Not on file   Number of children: Not on file   Years of education: Not on file   Highest education level: Not on file  Occupational History   Not on file  Tobacco Use   Smoking status: Every Day    Types: Cigarettes   Smokeless tobacco: Never  Substance and Sexual Activity   Alcohol use: Not Currently   Drug use: Never   Sexual activity: Not on file  Other Topics Concern   Not on file  Social History Narrative   Not on file   Social Determinants of Health   Financial Resource  Strain: Not on file  Food Insecurity: Not on file  Transportation Needs: Not on file  Physical Activity: Not on file  Stress: Not on file  Social Connections: Not on file  Intimate Partner Violence: Not on file    Family History:   The patient's family history is not on file.    ROS:   Review of Systems: [y] = yes, '[ ]'$  = no      General: Weight gain '[ ]'$ ; Weight loss '[ ]'$ ; Anorexia '[ ]'$ ; Fatigue '[ ]'$ ; Fever '[ ]'$ ; Chills '[ ]'$ ; Weakness '[ ]'$    Cardiac: Chest pain/pressure [y]; Resting SOB [y]; Exertional SOB '[ ]'$ ; Orthopnea '[ ]'$ ; Pedal Edema '[ ]'$ ; Palpitations '[ ]'$ ; Syncope '[ ]'$ ; Presyncope '[ ]'$ ; Paroxysmal nocturnal dyspnea '[ ]'$    Pulmonary: Cough '[ ]'$ ; Wheezing '[ ]'$ ; Hemoptysis '[ ]'$ ; Sputum '[ ]'$ ; Snoring '[ ]'$    GI: Vomiting '[ ]'$ ; Dysphagia '[ ]'$ ; Melena '[ ]'$ ; Hematochezia '[ ]'$ ; Heartburn '[ ]'$ ; Abdominal pain '[ ]'$ ; Constipation '[ ]'$ ; Diarrhea '[ ]'$ ; BRBPR '[ ]'$ ; nausea [y]   GU: Hematuria '[ ]'$ ; Dysuria '[ ]'$ ; Nocturia '[ ]'$  Vascular: Pain in legs with walking '[ ]'$ ; Pain in feet with lying flat '[ ]'$ ; Non-healing sores '[ ]'$ ; Stroke '[ ]'$ ; TIA '[ ]'$ ; Slurred speech '[ ]'$ ;   Neuro: Headaches '[ ]'$ ; Vertigo '[ ]'$ ; Seizures '[ ]'$ ; Paresthesias '[ ]'$ ;Blurred vision '[ ]'$ ; Diplopia '[ ]'$ ; Vision changes '[ ]'$    Ortho/Skin: Arthritis '[ ]'$ ; Joint pain '[ ]'$ ; Muscle pain '[ ]'$ ; Joint swelling '[ ]'$ ; Back Pain '[ ]'$ ; Rash '[ ]'$    Psych: Depression '[ ]'$ ; Anxiety '[ ]'$    Heme: Bleeding problems '[ ]'$ ; Clotting disorders '[ ]'$ ; Anemia '[ ]'$    Endocrine: Diabetes '[ ]'$ ; Thyroid dysfunction '[ ]'$    Physical Exam/Data:   Vitals:   08/02/21 1838 08/02/21 1845 08/02/21 1848 08/02/21 2013  BP:  132/74    Pulse:  73    Temp:   99 F (37.2 C)   SpO2:  94%  93%  Weight: 68 kg     Height: '5\' 3"'$  (1.6 m)      No intake or output data in the 24 hours ending 08/02/21 2027    08/02/2021    6:38 PM 07/21/2021    4:23 PM 04/17/2021    2:36 PM  Last 3 Weights  Weight (lbs) 150 lb 147 lb 144 lb  Weight (kg) 68.04 kg 66.679 kg 65.318 kg     Body mass index is 26.57 kg/m.   General:   Well nourished, well developed, in no acute distress HEENT: normal Lymph: no adenopathy Neck: JVD to mandible at 45 deg Endocrine:  No thryomegaly Vascular: No carotid bruits; FA pulses 2+ bilaterally without bruits  Cardiac:  normal S1, S2; RRR; no murmur Lungs:  clear  to auscultation bilaterally, no wheezing, rhonchi or rales  Abd: soft, nontender, no hepatomegaly  Ext: mild b/l LE edema  Musculoskeletal:  No deformities, BUE and BLE strength normal and equal Skin: warm and dry  Neuro:  CNs 2-12 intact, no focal abnormalities noted Psych:  Normal affect   EKG:  The ECG that was done 08/02/21 (18:47:56) was personally reviewed and demonstrates: NSR at 76 bpm, PR 168 ms, QRS 103 ms, Qtc 429 ms, 0.5 mm elevation in lead I, no elevation in aVL, 1 mm elevation in II/aVF with none in III, 1 mm in V2-V5, no prior comparison in our system (reportedly different than prior on outside review)   Relevant CV Studies: None   Laboratory Data:  High Sensitivity Troponin:   Recent Labs  Lab 08/02/21 1906  TROPONINIHS 53*      Chemistry Recent Labs  Lab 08/02/21 1906  NA 140  K 3.7  CL 107  CO2 29  GLUCOSE 143*  BUN 20  CREATININE 0.92  CALCIUM 8.8*  GFRNONAA >60  ANIONGAP 4*    Recent Labs  Lab 08/02/21 1906  PROT 6.3*  ALBUMIN 3.7  AST 18  ALT 12  ALKPHOS 47  BILITOT 0.6   Hematology Recent Labs  Lab 08/02/21 1906  WBC 6.4  RBC 4.39  HGB 13.4  HCT 40.6  MCV 92.5  MCH 30.5  MCHC 33.0  RDW 13.9  PLT 151   BNPNo results for input(s): BNP, PROBNP in the last 168 hours.  DDimer No results for input(s): DDIMER in the last 168 hours.  Radiology/Studies:  DG Chest Port 1 View  Result Date: 08/02/2021 CLINICAL DATA:  Shortness of breath beginning 3 nights ago. EXAM: PORTABLE CHEST 1 VIEW COMPARISON:  02/12/2021 FINDINGS: Artifact overlies the chest. Heart size upper limits of normal. Aortic atherosclerosis is present. There is mild interstitial edema and small pleural  effusions, suggesting congestive heart failure. Volume loss at the lung bases secondary to the effusions. No acute bone finding. IMPRESSION: Interstitial edema. Bilateral pleural effusions. Basilar volume loss associated with the fusions. Findings consistent with congestive heart failure. Electronically Signed   By: Nelson Chimes M.D.   On: 08/02/2021 19:18    TIMI Risk Score for ST  Elevation MI:   The patient's TIMI risk score is 8, which indicates a 26.8% risk of all cause mortality at 30 days.{  New York Heart Association (NYHA) Functional Class NYHA Class IV  Assessment and Plan:   Anterolateral STEMI Diffuse obstructive CAD HFrEF  Late presenting STEMI with diffuse multivessel disease without culprit lesion and with at least currently reduced EF now with heart failure symptoms of shortness of breath at rest with elevated LVEDP.  Plan for diuresis CT surgery consultation. - s/p ASA 324 mg PO x1 before transport, continue ASA 81 mg daily  - start lasix 40 mg IV bid, IO goal net negative 2 L daily  - TTE ordered - resume heparin gtt 2 hr post TR band removal  - P2Y12i held pending CABG eval  - home toprol XL 100 mg daily held until EF assessment and more stable  - ACEi not ordered post cath and with current hypotension  - start atorvastatin 80 mg qhs, d/c home simvastatin 40 mg daily  - hold toprol XL 100 mg daily until more stable hemodynamically   HTN Episode of flash pulmonary edema and respiratory distress following cath. sBP >200 for ~30 minutes. Additional lasix 40 mg IV, nitro gtt (uptitrated to max 200  mcg/min), hydral 10 mg IV, BiPAP and ativan with significant improvement in symptoms, BP and HR. Now hypotensive (85/50s) off NG gtt and with all HTN meds held. Lactate normal (1.4). Good UOP with initial diuresis of lasix 40 mg IV x2. Holding HTN meds currently.   Tobacco use Few cigarettes a day, she needs to quite with significant CAD. Declines patch.   Severity of  Illness: The appropriate patient status for this patient is INPATIENT. Inpatient status is judged to be reasonable and necessary in order to provide the required intensity of service to ensure the patient's safety. The patient's presenting symptoms, physical exam findings, and initial radiographic and laboratory data in the context of their chronic comorbidities is felt to place them at high risk for further clinical deterioration. Furthermore, it is not anticipated that the patient will be medically stable for discharge from the hospital within 2 midnights of admission.   * I certify that at the point of admission it is my clinical judgment that the patient will require inpatient hospital care spanning beyond 2 midnights from the point of admission due to high intensity of service, high risk for further deterioration and high frequency of surveillance required.*   For questions or updates, please contact [place] Please consult [email or web address] for contact info under   Signed, Dion Body, MD  08/02/2021 8:27 PM

## 2021-08-02 NOTE — ED Triage Notes (Signed)
Reports sob that started 3 nights ago.  Reports that it is intermittent.  Reports cp x 1 week.  Reports has had a lot of family stressor during this time also.  Resp even and unlabored at present.  Denies cp at present.

## 2021-08-02 NOTE — Progress Notes (Signed)
ANTICOAGULATION CONSULT NOTE  Pharmacy Consult for heparin Indication: chest pain/ACS  Allergies  Allergen Reactions   Morphine Rash    Patient Measurements: Height: '5\' 3"'$  (160 cm) Weight: 68 kg (150 lb) IBW/kg (Calculated) : 52.4 Heparin Dosing Weight: 68kg  Vital Signs: Temp: 99 F (37.2 C) (06/03 1848) BP: 136/70 (06/03 2040) Pulse Rate: 66 (06/03 2040)  Labs: Recent Labs    08/02/21 1906  HGB 13.4  HCT 40.6  PLT 151  APTT 26  LABPROT 12.2  INR 0.9  CREATININE 0.92  TROPONINIHS 53*    Estimated Creatinine Clearance: 47.4 mL/min (by C-G formula based on SCr of 0.92 mg/dL).   Medical History: Past Medical History:  Diagnosis Date   Arthritis    Hx of non-Hodgkin's lymphoma    Hypertension      Assessment: 1 yoF admitted as code STEMI. Pt s/p LHC with mvCAD, planning CABG workup. Pharmacy to start IV heparin 2h after TR band removed. CBC wnl on admit, no AC prior to admission.  Goal of Therapy:  Heparin level 0.3-0.7 units/ml Monitor platelets by anticoagulation protocol: Yes   Plan:  F/U TR band removal Heparin 850 units/h no bolus 2h after TR band removal  Arrie Senate, PharmD, BCPS, [facility] Clinical Pharmacist [phone] Please check AMION for all [facility] Pharmacy numbers 08/02/2021

## 2021-08-02 NOTE — ED Notes (Signed)
Zoll pads applied anterior and posteriorly

## 2021-08-03 ENCOUNTER — Inpatient Hospital Stay (HOSPITAL_COMMUNITY): Payer: Medicare Other

## 2021-08-03 DIAGNOSIS — I5021 Acute systolic (congestive) heart failure: Secondary | ICD-10-CM

## 2021-08-03 DIAGNOSIS — I2111 ST elevation (STEMI) myocardial infarction involving right coronary artery: Secondary | ICD-10-CM

## 2021-08-03 DIAGNOSIS — R079 Chest pain, unspecified: Secondary | ICD-10-CM

## 2021-08-03 DIAGNOSIS — I2510 Atherosclerotic heart disease of native coronary artery without angina pectoris: Secondary | ICD-10-CM | POA: Diagnosis not present

## 2021-08-03 DIAGNOSIS — I509 Heart failure, unspecified: Secondary | ICD-10-CM | POA: Diagnosis not present

## 2021-08-03 DIAGNOSIS — I213 ST elevation (STEMI) myocardial infarction of unspecified site: Secondary | ICD-10-CM | POA: Diagnosis not present

## 2021-08-03 LAB — CBC WITH DIFFERENTIAL/PLATELET
Abs Immature Granulocytes: 0.05 10*3/uL (ref 0.00–0.07)
Basophils Absolute: 0 10*3/uL (ref 0.0–0.1)
Basophils Relative: 0 %
Eosinophils Absolute: 0.2 10*3/uL (ref 0.0–0.5)
Eosinophils Relative: 2 %
HCT: 41.9 % (ref 36.0–46.0)
Hemoglobin: 14.1 g/dL (ref 12.0–15.0)
Immature Granulocytes: 1 %
Lymphocytes Relative: 10 %
Lymphs Abs: 0.9 10*3/uL (ref 0.7–4.0)
MCH: 30.7 pg (ref 26.0–34.0)
MCHC: 33.7 g/dL (ref 30.0–36.0)
MCV: 91.3 fL (ref 80.0–100.0)
Monocytes Absolute: 0.7 10*3/uL (ref 0.1–1.0)
Monocytes Relative: 8 %
Neutro Abs: 7.2 10*3/uL (ref 1.7–7.7)
Neutrophils Relative %: 79 %
Platelets: 172 10*3/uL (ref 150–400)
RBC: 4.59 MIL/uL (ref 3.87–5.11)
RDW: 14 % (ref 11.5–15.5)
WBC: 9.1 10*3/uL (ref 4.0–10.5)
nRBC: 0 % (ref 0.0–0.2)

## 2021-08-03 LAB — MRSA NEXT GEN BY PCR, NASAL: MRSA by PCR Next Gen: NOT DETECTED

## 2021-08-03 LAB — ECHOCARDIOGRAM COMPLETE
Area-P 1/2: 3.53 cm2
Calc EF: 21.8 %
Height: 63 in
S' Lateral: 4.1 cm
Single Plane A2C EF: 17.5 %
Single Plane A4C EF: 28.2 %
Weight: 2400 oz

## 2021-08-03 LAB — LACTIC ACID, PLASMA
Lactic Acid, Venous: 1.3 mmol/L (ref 0.5–1.9)
Lactic Acid, Venous: 1.2 mmol/L (ref 0.5–1.9)

## 2021-08-03 LAB — COMPREHENSIVE METABOLIC PANEL
ALT: 14 U/L (ref 0–44)
AST: 22 U/L (ref 15–41)
Albumin: 3.4 g/dL — ABNORMAL LOW (ref 3.5–5.0)
Alkaline Phosphatase: 49 U/L (ref 38–126)
Anion gap: 8 (ref 5–15)
BUN: 17 mg/dL (ref 8–23)
CO2: 31 mmol/L (ref 22–32)
Calcium: 8.8 mg/dL — ABNORMAL LOW (ref 8.9–10.3)
Chloride: 103 mmol/L (ref 98–111)
Creatinine, Ser: 0.92 mg/dL (ref 0.44–1.00)
GFR, Estimated: >60 mL/min (ref >60)
Glucose, Bld: 123 mg/dL — ABNORMAL HIGH (ref 70–99)
Potassium: 3.6 mmol/L (ref 3.5–5.1)
Sodium: 142 mmol/L (ref 135–145)
Total Bilirubin: 0.4 mg/dL (ref 0.3–1.2)
Total Protein: 5.9 g/dL — ABNORMAL LOW (ref 6.5–8.1)

## 2021-08-03 LAB — HEPARIN LEVEL (UNFRACTIONATED)
Heparin Unfractionated: 0.2 IU/mL — ABNORMAL LOW (ref 0.30–0.70)
Heparin Unfractionated: 0.48 IU/mL (ref 0.30–0.70)

## 2021-08-03 LAB — TROPONIN I (HIGH SENSITIVITY): Troponin I (High Sensitivity): 139 ng/L (ref <18)

## 2021-08-03 LAB — MAGNESIUM: Magnesium: 2 mg/dL (ref 1.7–2.4)

## 2021-08-03 MED ORDER — CARVEDILOL 3.125 MG PO TABS
3.1250 mg | ORAL_TABLET | Freq: Two times a day (BID) | ORAL | Status: DC
Start: 2021-08-03 — End: 2021-08-09
  Administered 2021-08-03 – 2021-08-09 (×11): 3.125 mg via ORAL
  Filled 2021-08-03 (×11): qty 1

## 2021-08-03 MED ORDER — POTASSIUM CHLORIDE CRYS ER 20 MEQ PO TBCR
40.0000 meq | EXTENDED_RELEASE_TABLET | Freq: Two times a day (BID) | ORAL | Status: AC
Start: 2021-08-03 — End: 2021-08-03
  Administered 2021-08-03 (×2): 40 meq via ORAL
  Filled 2021-08-03 (×2): qty 2

## 2021-08-03 MED ORDER — MELATONIN 5 MG PO TABS
5.0000 mg | ORAL_TABLET | Freq: Every evening | ORAL | Status: DC | PRN
  Administered 2021-08-03 – 2021-08-09 (×6): 5 mg via ORAL
  Filled 2021-08-03 (×6): qty 1

## 2021-08-03 MED ORDER — POLYETHYLENE GLYCOL 3350 17 G PO PACK
17.0000 g | PACK | Freq: Two times a day (BID) | ORAL | Status: DC
  Administered 2021-08-06: 17 g via ORAL
  Filled 2021-08-03 (×8): qty 1

## 2021-08-03 MED ORDER — ALPRAZOLAM 0.5 MG PO TABS
0.5000 mg | ORAL_TABLET | Freq: Two times a day (BID) | ORAL | Status: DC | PRN
Start: 2021-08-03 — End: 2021-08-09
  Administered 2021-08-03 – 2021-08-08 (×8): 0.5 mg via ORAL
  Filled 2021-08-03 (×8): qty 1

## 2021-08-03 MED ORDER — PERFLUTREN LIPID MICROSPHERE
1.0000 mL | INTRAVENOUS | Status: AC | PRN
  Administered 2021-08-03: 2 mL via INTRAVENOUS

## 2021-08-03 MED ORDER — HEPARIN (PORCINE) 25000 UT/250ML-% IV SOLN
950.0000 [IU]/h | INTRAVENOUS | Status: DC
  Administered 2021-08-03: 850 [IU]/h via INTRAVENOUS
  Administered 2021-08-05 – 2021-08-06 (×2): 950 [IU]/h via INTRAVENOUS
  Filled 2021-08-03 (×4): qty 250

## 2021-08-03 MED ORDER — OXYCODONE HCL 5 MG PO TABS
20.0000 mg | ORAL_TABLET | Freq: Four times a day (QID) | ORAL | Status: DC | PRN
  Administered 2021-08-03 – 2021-08-09 (×12): 20 mg via ORAL
  Filled 2021-08-03 (×12): qty 4

## 2021-08-03 MED ORDER — BISACODYL 10 MG RE SUPP: 10.0000 mg | Freq: Every day | RECTAL | Status: DC | PRN

## 2021-08-03 MED ORDER — MAGNESIUM HYDROXIDE 400 MG/5ML PO SUSP
30.0000 mL | Freq: Every day | ORAL | Status: DC | PRN
  Administered 2021-08-03: 30 mL via ORAL
  Filled 2021-08-03: qty 30

## 2021-08-03 MED ORDER — CHLORHEXIDINE GLUCONATE CLOTH 2 % EX PADS
6.0000 | MEDICATED_PAD | Freq: Every day | CUTANEOUS | Status: DC
  Administered 2021-08-03 – 2021-08-09 (×7): 6 via TOPICAL

## 2021-08-03 NOTE — Progress Notes (Signed)
ANTICOAGULATION CONSULT NOTE  Pharmacy Consult for heparin Indication: chest pain/ACS  Allergies  Allergen Reactions   Morphine Rash    Patient Measurements: Height: '5\' 3"'$  (160 cm) Weight: 68 kg (150 lb) IBW/kg (Calculated) : 52.4 Heparin Dosing Weight: 68kg  Vital Signs: Temp: 98.5 F (36.9 C) (06/04 2000) Temp Source: Oral (06/04 2000) BP: 128/55 (06/04 2000) Pulse Rate: 78 (06/04 2000)  Labs: Recent Labs    08/02/21 1906 08/02/21 2135 08/03/21 0033 08/03/21 0947 08/03/21 1931  HGB 13.4  --  14.1  --   --   HCT 40.6  --  41.9  --   --   PLT 151  --  172  --   --   APTT 26  --   --   --   --   LABPROT 12.2  --   --   --   --   INR 0.9  --   --   --   --   HEPARINUNFRC  --   --   --  0.20* 0.48  CREATININE 0.92  --  0.92  --   --   TROPONINIHS 53* 78* 139*  --   --      Estimated Creatinine Clearance: 47.4 mL/min (by C-G formula based on SCr of 0.92 mg/dL).   Medical History: Past Medical History:  Diagnosis Date   Arthritis    Hx of non-Hodgkin's lymphoma    Hypertension      Assessment: 83 yoF admitted as code STEMI. Pt s/p LHC with mvCAD, planning CABG workup. Pharmacy to dose IV heparin, no AC prior to admission.  Repeat heparin level is therapeutic at 0.48.  Goal of Therapy:  Heparin level 0.3-0.7 units/ml Monitor platelets by anticoagulation protocol: Yes   Plan:  -Continue heparin 1000 units/hr -Daily heparin level and CBC  Arrie Senate, PharmD, BCPS, [facility] Clinical Pharmacist [phone] Please check AMION for all [facility] Pharmacy numbers 08/03/2021

## 2021-08-03 NOTE — Progress Notes (Signed)
Progress Note  Patient Name: [name] Date of Encounter: 08/03/2021  Primary Cardiologist:   None   Subjective   No chest pain and breathing is improved.    Inpatient Medications    Scheduled Meds:  aspirin EC  81 mg Oral Daily   atorvastatin  80 mg Oral QHS   furosemide  40 mg Intravenous BID   sodium chloride flush  3 mL Intravenous Q12H   Continuous Infusions:  sodium chloride Stopped (08/03/21 0151)   sodium chloride     heparin 850 Units/hr (08/03/21 0700)   nitroGLYCERIN Stopped (08/02/21 2200)   PRN Meds: sodium chloride, acetaminophen, nitroGLYCERIN, ondansetron (ZOFRAN) IV, sodium chloride flush   Vital Signs    Vitals:   08/03/21 0400 08/03/21 0500 08/03/21 0600 08/03/21 0700  BP: 115/67 128/65 110/65 120/66  Pulse: 67 66 63 76  Resp: '16 17 16 18  '$ Temp: 98.4 F (36.9 C)     TempSrc: Oral     SpO2: 96% 96% 97% 100%  Weight:      Height:        Intake/Output Summary (Last 24 hours) at 08/03/2021 0747 Last data filed at 08/03/2021 0700 Gross per 24 hour  Intake 123.37 ml  Output 2710 ml  Net -2586.63 ml   Filed Weights   08/02/21 1838  Weight: 68 kg    Telemetry    NSR - Personally Reviewed  ECG    NA - Personally Reviewed  Physical Exam   GEN: No acute distress.   Neck: No  JVD Cardiac: RRR, no murmurs, rubs, or gallops.  Respiratory: Clear  to auscultation bilaterally. GI: Soft, nontender, non-distended  MS: No  edema; No deformity.  Right radial without bleeding. Neuro:  Nonfocal  Psych: Normal affect   Labs    Chemistry Recent Labs  Lab 08/02/21 1906 08/03/21 0033  NA 140 142  K 3.7 3.6  CL 107 103  CO2 29 31  GLUCOSE 143* 123*  BUN 20 17  CREATININE 0.92 0.92  CALCIUM 8.8* 8.8*  PROT 6.3* 5.9*  ALBUMIN 3.7 3.4*  AST 18 22  ALT 12 14  ALKPHOS 47 49  BILITOT 0.6 0.4  GFRNONAA >60 >60  ANIONGAP 4* 8     Hematology Recent Labs  Lab 08/02/21 1906 08/03/21 0033  WBC 6.4 9.1  RBC 4.39 4.59  HGB 13.4  14.1  HCT 40.6 41.9  MCV 92.5 91.3  MCH 30.5 30.7  MCHC 33.0 33.7  RDW 13.9 14.0  PLT 151 172    Cardiac EnzymesNo results for input(s): TROPONINI in the last 168 hours. No results for input(s): TROPIPOC in the last 168 hours.   BNPNo results for input(s): BNP, PROBNP in the last 168 hours.   DDimer No results for input(s): DDIMER in the last 168 hours.   Radiology    CARDIAC CATHETERIZATION  Result Date: 08/02/2021 Conclusions: Severe three-vessel coronary artery disease including sequential 80-90% proximal and mid LAD stenoses, 70% proximal ramus intermedius lesion, multifocal LCx disease of up to 90% in the mid/distal vessel, and diffusely diseased proximal/mid RCA with up to 50% stenosis as well as sequential 90% ostial and 70% mid RPDA lesions. Moderately-severely elevated left ventricular filling pressure (LVEDP 30-35 mmHg).  LVEF suboptimally assessed but is likely mildly-moderately reduced with anterior hypokinesis. Recommendations: Case discussed with Dr. Cyndia Bent (cardiac surgery).  There is not a clear culprit for the patient's EKG changes.  I suspect that she may have had an ischemic event several days  ago with her transient chest pain and is now manifesting predominantly heart failure symptoms.  We will optimize her heart failure while discussing optimal revascularization strategy. Patient given furosemide 40 mg IV at the end of the procedure.  Continue diuresis as blood pressure and renal function tolerate. Hold home doses of metoprolol and lisinopril for now.  Reinstitute goal-directed medical therapy as tolerated based on blood pressure, renal function, and heart failure. Obtain echocardiogram. Restart IV heparin 2 hours after TR band removal.  Defer adding P2Y12 inhibitor pending cardiac surgery consultation. Aggressive secondary prevention of coronary artery disease. Nelva Bush, MD [facility]  DG CHEST PORT 1 VIEW  Result Date: 08/03/2021 CLINICAL DATA:  Evaluate  atelectasis EXAM: PORTABLE CHEST 1 VIEW COMPARISON:  Prior chest x-ray yesterday 08/02/2021 FINDINGS: External defibrillator pads project over the left chest. Cardiac and mediastinal contours are stable. Atherosclerotic calcifications in the transverse aorta. Significantly improved pulmonary edema. Probable trace bilateral pleural effusions and associated atelectasis. The amount of atelectasis has improved. No pneumothorax. No acute osseous abnormality. IMPRESSION: 1. Interval resolution of pulmonary edema with significant improvement in aeration. 2. Probable trace bilateral pleural effusions and associated bibasilar atelectasis. Electronically Signed   By: Jacqulynn Cadet M.D.   On: 08/03/2021 07:24   DG CHEST PORT 1 VIEW  Result Date: 08/02/2021 CLINICAL DATA:  Pulmonary edema. EXAM: PORTABLE CHEST 1 VIEW COMPARISON:  August 02, 2021 (7:11 p.m.) FINDINGS: Low lung volumes are seen with diffusely increased interstitial lung markings. Mild atelectasis and/or infiltrate is also seen within the bilateral lung bases. This is mildly increased in severity when compared to the prior study. No pleural effusion or pneumothorax is identified. The cardiac silhouette is within the upper limits of normal and stable in size. A radiopaque fusion plate and screws are seen overlying the lower cervical spine. Multilevel degenerative changes are seen throughout the thoracic spine. IMPRESSION: 1. Low lung volumes with mild, stable interstitial edema. 2. Mild bibasilar atelectasis, mildly increased in severity when compared to the prior study. Electronically Signed   By: Virgina Norfolk M.D.   On: 08/02/2021 22:55   DG Chest Port 1 View  Result Date: 08/02/2021 CLINICAL DATA:  Shortness of breath beginning 3 nights ago. EXAM: PORTABLE CHEST 1 VIEW COMPARISON:  02/12/2021 FINDINGS: Artifact overlies the chest. Heart size upper limits of normal. Aortic atherosclerosis is present. There is mild interstitial edema and small pleural  effusions, suggesting congestive heart failure. Volume loss at the lung bases secondary to the effusions. No acute bone finding. IMPRESSION: Interstitial edema. Bilateral pleural effusions. Basilar volume loss associated with the fusions. Findings consistent with congestive heart failure. Electronically Signed   By: Nelson Chimes M.D.   On: 08/02/2021 19:18    Cardiac Studies    Echo:  Pending  Cath:    Diagnostic Dominance: Right    Patient Profile     76 y.o. female with HTN, HLD, and tobacco use, who presented to [facility] with 3 days of SOB and CP and ECG c/w possible anterolateral STEMI.   Assessment & Plan    Acute anterolateral STEMI:    Probable late presentation.  No clear culprit so no acute PCI indicated.  Multivessel disease.  Probably moderately reduced EF.  Echo pending.   TCTS has been consulted.    HFrEF:    Follow up CXR with improved edema.  Net negative 2.6 liters.  BP has come up slightly.  Give low dose beta blocker.  Start Entresto or ARB in AM if  BP allows and creat stable.  Continue Lasix.    HTN:  Was hypotensive post cath.  Unable to titrate meds.    Tobacco abuse:  Educated  For questions or updates, please contact [place] Please consult [email or web address] for contact info under Cardiology/STEMI.   Signed, Minus Breeding, MD  08/03/2021, 7:47 AM

## 2021-08-03 NOTE — Progress Notes (Signed)
  Echocardiogram 2D Echocardiogram has been performed.  [name] 08/03/2021, 9:06 AM

## 2021-08-03 NOTE — Consult Note (Signed)
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 Referring Physician: Dr. Harrell Gave End  [name] is an 76 y.o. female.  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  She has a long history of intermittent lower substernal discomfort that she said was worked up in the past and she was told that she had esophageal spasm.  She presented to [facility] emergency department yesterday with electrocardiogram showing some ST elevation in lead I, aVF, and V2-V5.  A code STEMI was activated and she was transferred to [facility].  She was free of chest pain on arrival but had ongoing shortness of breath.  Emergent cardiac catheterization showed severe multivessel coronary disease with 90% proximal LAD stenosis.  There is about 70% stenosis after the takeoff of a diagonal branch 90% stenosis just beyond that in the mid LAD.  The left circumflex was a relatively small system with 50% proximal, 70% mid, 90% distal stenosis.  The right coronary artery was diffusely diseased throughout its proximal and mid portions up to 50%.  There is about 90% stenosis at the ostium of the PDA branch and about 70% stenosis in the proximal PDA.  LVEDP was 30.  Since there was no obvious culprit lesion PCI was deferred and the patient was treated for acute congestive heart failure with some pulmonary edema on chest x-ray.  She  has had no chest pain since admission.  She feels better after diuresis.  She is widowed.  Her activity level is limited due to her spinal stenosis causing a lot of back pain and pain in her legs.  She can walk short distances using walking sticks.  She takes oxycodone 30 mg 3 times per day at home as well as Naprosyn, Neurontin, Voltaren gel, Cymbalta, and Xanax.  She continues to smoke a few cigarettes per day.  Past Medical History:  Diagnosis Date   Arthritis    Hx of non-Hodgkin's lymphoma    Hypertension     No past surgical history on file.  No family history on file.  Social History:  reports that she has been smoking cigarettes. She has never used smokeless tobacco. She reports that she does not currently use alcohol. She reports that she does not use drugs.  Allergies:  Allergies  Allergen Reactions   Morphine Rash    Medications: I have reviewed the patient's current medications. Prior to Admission:  Medications Prior to Admission  Medication Sig Dispense Refill Last Dose   ALPRAZolam (XANAX) 0.5 MG tablet Take 0.5 mg by mouth 2 (two) times daily as needed.   Past Week   aspirin EC 81 MG tablet Take 81 mg by mouth daily. Swallow whole.   08/02/2021   chlorthalidone (HYGROTON) 25 MG tablet Take 12.5 mg by mouth every other day.   08/02/2021   cyanocobalamin (,VITAMIN B-12,) 1000 MCG/ML injection  Inject 1 mcg into the muscle every 30 (thirty) days.   Past Month   diclofenac Sodium (VOLTAREN) 1 % GEL Apply 2 g topically 4 (four) times daily as needed (pain).   Past Week   DULoxetine (CYMBALTA) 30 MG capsule Take 30 mg by mouth 3 (three) times daily.   08/02/2021   fluticasone (FLONASE) 50 MCG/ACT nasal spray Place 2 sprays into both nostrils daily as needed for allergies.   08/02/2021   gabapentin (NEURONTIN) 800 MG tablet Take 800 mg by mouth 3 (three) times daily.   08/02/2021   metoprolol succinate (TOPROL-XL) 100 MG 24 hr tablet Take 100 mg by mouth daily.   08/02/2021 at 11:00    naproxen (NAPROSYN) 500 MG tablet Take 1 tablet (500 mg total) by mouth 2 (two) times daily with a meal. 30 tablet 0 Past Week   oxycodone (ROXICODONE) 30 MG immediate release tablet Take 30 mg by mouth 3 (three) times daily as needed for pain.   08/02/2021   pantoprazole (PROTONIX) 40 MG tablet Take 40 mg by mouth daily.   08/02/2021   simvastatin (ZOCOR) 40 MG tablet Take 40 mg by mouth daily.   Past Week   Scheduled:  aspirin EC  81 mg Oral Daily   atorvastatin  80 mg Oral QHS   carvedilol  3.125 mg Oral BID WC   Chlorhexidine Gluconate Cloth  6 each Topical Daily   furosemide  40 mg Intravenous BID   potassium chloride  40 mEq Oral BID   sodium chloride flush  3 mL Intravenous Q12H   Continuous:  sodium chloride Stopped (08/03/21 0151)   sodium chloride     heparin 1,000 Units/hr (08/03/21 1300)   nitroGLYCERIN Stopped (08/02/21 2200)   VEL:FYBOFB chloride, acetaminophen, ALPRAZolam, nitroGLYCERIN, ondansetron (ZOFRAN) IV, oxyCODONE, sodium chloride flush Anti-infectives (From admission, onward)    None       Results for orders placed or performed during the hospital encounter of 08/02/21 (from the past 48 hour(s))  Hemoglobin A1c     Status: None   Collection Time: 08/02/21  7:06 PM  Result Value Ref Range   Hgb A1c MFr Bld 5.6 4.8 - 5.6 %    Comment: (NOTE) Pre diabetes:          5.7%-6.4%  Diabetes:              >6.4%  Glycemic control for   <7.0% adults with diabetes    Mean Plasma Glucose 114.02 mg/dL    Comment: Performed at [facility], [address] [place], [place] [address]  CBC with Differential/Platelet     Status: None   Collection Time: 08/02/21  7:06 PM  Result Value Ref Range   WBC 6.4 4.0 - 10.5 K/uL   RBC 4.39 3.87 - 5.11 MIL/uL   Hemoglobin 13.4 12.0 - 15.0 g/dL   HCT 40.6 36.0 - 46.0 %   MCV 92.5 80.0 - 100.0 fL   MCH 30.5 26.0 - 34.0 pg   MCHC 33.0 30.0 - 36.0 g/dL   RDW 13.9 11.5 - 15.5 %   Platelets 151 150 - 400 K/uL   nRBC 0.0  0.0 - 0.2 %   Neutrophils Relative % 65 %   Neutro Abs 4.2 1.7 - 7.7 K/uL   Lymphocytes Relative 19 %   Lymphs Abs 1.2 0.7 - 4.0 K/uL   Monocytes Relative 11 %   Monocytes Absolute 0.7 0.1 - 1.0 K/uL   Eosinophils Relative 4 %  Eosinophils Absolute 0.2 0.0 - 0.5 K/uL   Basophils Relative 1 %   Basophils Absolute 0.0 0.0 - 0.1 K/uL   Immature Granulocytes 0 %   Abs Immature Granulocytes 0.01 0.00 - 0.07 K/uL    Comment: Performed at [facility], [address], [place], [place] [address]  Protime-INR     Status: None   Collection Time: 08/02/21  7:06 PM  Result Value Ref Range   Prothrombin Time 12.2 11.4 - 15.2 seconds   INR 0.9 0.8 - 1.2    Comment: (NOTE) INR goal varies based on device and disease states. Performed at [facility], [address], [place], [place] [address]   APTT     Status: None   Collection Time: 08/02/21  7:06 PM  Result Value Ref Range   aPTT 26 24 - 36 seconds    Comment: Performed at [facility], [address] [place], [place] [address]  Comprehensive metabolic panel     Status: Abnormal   Collection Time: 08/02/21  7:06 PM  Result Value Ref Range   Sodium 140 135 - 145 mmol/L   Potassium 3.7 3.5 - 5.1 mmol/L   Chloride 107 98 - 111 mmol/L   CO2 29 22 - 32 mmol/L   Glucose, Bld 143 (H) 70 - 99 mg/dL    Comment: Glucose reference range applies only to samples taken after fasting for at least 8 hours.   BUN 20 8 - 23 mg/dL   Creatinine, Ser 0.92 0.44 - 1.00 mg/dL   Calcium 8.8 (L) 8.9 - 10.3 mg/dL   Total Protein 6.3 (L) 6.5 - 8.1 g/dL   Albumin 3.7 3.5 - 5.0 g/dL   AST 18 15 - 41 U/L   ALT 12 0 - 44 U/L   Alkaline Phosphatase 47 38 - 126 U/L   Total Bilirubin 0.6 0.3 - 1.2 mg/dL   GFR, Estimated >60 >60 mL/min    Comment: (NOTE) Calculated using the CKD-EPI Creatinine Equation (2021)    Anion gap 4 (L) 5 - 15    Comment: Performed at [facility], [address] [place], [place] [address]  Troponin I (High Sensitivity)     Status:  Abnormal   Collection Time: 08/02/21  7:06 PM  Result Value Ref Range   Troponin I (High Sensitivity) 53 (H) <18 ng/L    Comment: (NOTE) Elevated high sensitivity troponin I (hsTnI) values and significant  changes across serial measurements may suggest ACS but many other  chronic and acute conditions are known to elevate hsTnI results.  Refer to the "Links" section for chest pain algorithms and additional  guidance. Performed at [facility], [address], [place], [place] [address]   Lipid panel     Status: None   Collection Time: 08/02/21  7:06 PM  Result Value Ref Range   Cholesterol 153 0 - 200 mg/dL   Triglycerides 124 <150 mg/dL   HDL 48 >40 mg/dL   Total CHOL/HDL Ratio 3.2 RATIO   VLDL 25 0 - 40 mg/dL   LDL Cholesterol 80 0 - 99 mg/dL    Comment:        Total Cholesterol/HDL:CHD Risk Coronary Heart Disease Risk Table                     Men   Women  1/2 Average Risk   3.4   3.3  Average Risk       5.0   4.4  2 X Average Risk  9.6   7.1  3 X Average Risk  23.4   11.0        Use the calculated Patient Ratio above and the CHD Risk Table to determine the patient's CHD Risk.        ATP III CLASSIFICATION (LDL):  <100     mg/dL   Optimal  100-129  mg/dL   Near or Above                    Optimal  130-159  mg/dL   Borderline  160-189  mg/dL   High  >190     mg/dL   Very High Performed at [facility], [address] [place], [place] [address]   Resp Panel by RT-PCR (Flu A&B, Covid) Anterior Nasal Swab     Status: None   Collection Time: 08/02/21  7:27 PM   Specimen: Anterior Nasal Swab  Result Value Ref Range   SARS Coronavirus 2 by RT PCR NEGATIVE NEGATIVE    Comment: (NOTE) SARS-CoV-2 target nucleic acids are NOT DETECTED.  The SARS-CoV-2 RNA is generally detectable in upper respiratory specimens during the acute phase of infection. The lowest concentration of SARS-CoV-2 viral copies this assay can detect is 138 copies/mL. A negative result does not preclude  SARS-Cov-2 infection and should not be used as the sole basis for treatment or other patient management decisions. A negative result may occur with  improper specimen collection/handling, submission of specimen other than nasopharyngeal swab, presence of viral mutation(s) within the areas targeted by this assay, and inadequate number of viral copies(<138 copies/mL). A negative result must be combined with clinical observations, patient history, and epidemiological information. The expected result is Negative.  Fact Sheet for Patients:  EntrepreneurPulse.com.au  Fact Sheet for Healthcare Providers:  IncredibleEmployment.be  This test is no t yet approved or cleared by the [place] FDA and  has been authorized for detection and/or diagnosis of SARS-CoV-2 by FDA under an Emergency Use Authorization (EUA). This EUA will remain  in effect (meaning this test can be used) for the duration of the COVID-19 declaration under Section 564(b)(1) of the Act, 21 U.S.C.section 360bbb-3(b)(1), unless the authorization is terminated  or revoked sooner.       Influenza A by PCR NEGATIVE NEGATIVE   Influenza B by PCR NEGATIVE NEGATIVE    Comment: (NOTE) The Xpert Xpress SARS-CoV-2/FLU/RSV plus assay is intended as an aid in the diagnosis of influenza from Nasopharyngeal swab specimens and should not be used as a sole basis for treatment. Nasal washings and aspirates are unacceptable for Xpert Xpress SARS-CoV-2/FLU/RSV testing.  Fact Sheet for Patients: EntrepreneurPulse.com.au  Fact Sheet for Healthcare Providers: IncredibleEmployment.be  This test is not yet approved or cleared by the [place] FDA and has been authorized for detection and/or diagnosis of SARS-CoV-2 by FDA under an Emergency Use Authorization (EUA). This EUA will remain in effect (meaning this test can be used) for the duration of the COVID-19  declaration under Section 564(b)(1) of the Act, 21 U.S.C. section 360bbb-3(b)(1), unless the authorization is terminated or revoked.  Performed at [facility], [address] [place] [address], [place], [place] [address]   Troponin I (High Sensitivity)     Status: Abnormal   Collection Time: 08/02/21  9:35 PM  Result Value Ref Range   Troponin I (High Sensitivity) 78 (H) <18 ng/L    Comment: (NOTE) Elevated high sensitivity troponin I (hsTnI) values and significant  changes across serial measurements may suggest ACS but many  other  chronic and acute conditions are known to elevate hsTnI results.  Refer to the "Links" section for chest pain algorithms and additional  guidance. Performed at [facility], [place] [address] [place], [place] [address]   Magnesium     Status: None   Collection Time: 08/02/21  9:35 PM  Result Value Ref Range   Magnesium 2.2 1.7 - 2.4 mg/dL    Comment: Performed at [facility] [address] [place], [place] [address]  Lactic acid, plasma     Status: None   Collection Time: 08/02/21  9:36 PM  Result Value Ref Range   Lactic Acid, Venous 1.4 0.5 - 1.9 mmol/L    Comment: Performed at [facility] [address] [place], [place] [address]  Glucose, capillary     Status: Abnormal   Collection Time: 08/02/21  9:52 PM  Result Value Ref Range   Glucose-Capillary 146 (H) 70 - 99 mg/dL    Comment: Glucose reference range applies only to samples taken after fasting for at least 8 hours.  Lactic acid, plasma     Status: None   Collection Time: 08/03/21 12:33 AM  Result Value Ref Range   Lactic Acid, Venous 1.3 0.5 - 1.9 mmol/L    Comment: Performed at [facility] [address] [place], [place] [address]  Comprehensive metabolic panel     Status: Abnormal   Collection Time: 08/03/21 12:33 AM  Result Value Ref Range   Sodium 142 135 - 145 mmol/L   Potassium 3.6 3.5 - 5.1 mmol/L   Chloride 103 98 - 111 mmol/L   CO2 31 22 - 32 mmol/L   Glucose,  Bld 123 (H) 70 - 99 mg/dL    Comment: Glucose reference range applies only to samples taken after fasting for at least 8 hours.   BUN 17 8 - 23 mg/dL   Creatinine, Ser 0.92 0.44 - 1.00 mg/dL   Calcium 8.8 (L) 8.9 - 10.3 mg/dL   Total Protein 5.9 (L) 6.5 - 8.1 g/dL   Albumin 3.4 (L) 3.5 - 5.0 g/dL   AST 22 15 - 41 U/L   ALT 14 0 - 44 U/L   Alkaline Phosphatase 49 38 - 126 U/L   Total Bilirubin 0.4 0.3 - 1.2 mg/dL   GFR, Estimated >60 >60 mL/min    Comment: (NOTE) Calculated using the CKD-EPI Creatinine Equation (2021)    Anion gap 8 5 - 15    Comment: Performed at [facility], [place] [address], [place], [place] [address]  Magnesium     Status: None   Collection Time: 08/03/21 12:33 AM  Result Value Ref Range   Magnesium 2.0 1.7 - 2.4 mg/dL    Comment: Performed at [facility] [address] [place], [place] [address]  CBC with Differential/Platelet     Status: None   Collection Time: 08/03/21 12:33 AM  Result Value Ref Range   WBC 9.1 4.0 - 10.5 K/uL   RBC 4.59 3.87 - 5.11 MIL/uL   Hemoglobin 14.1 12.0 - 15.0 g/dL   HCT 41.9 36.0 - 46.0 %   MCV 91.3 80.0 - 100.0 fL   MCH 30.7 26.0 - 34.0 pg   MCHC 33.7 30.0 - 36.0 g/dL   RDW 14.0 11.5 - 15.5 %   Platelets 172 150 - 400 K/uL   nRBC 0.0 0.0 - 0.2 %   Neutrophils Relative % 79 %   Neutro  Abs 7.2 1.7 - 7.7 K/uL   Lymphocytes Relative 10 %   Lymphs Abs 0.9 0.7 - 4.0 K/uL   Monocytes Relative 8 %   Monocytes Absolute 0.7 0.1 - 1.0 K/uL   Eosinophils Relative 2 %   Eosinophils Absolute 0.2 0.0 - 0.5 K/uL   Basophils Relative 0 %   Basophils Absolute 0.0 0.0 - 0.1 K/uL   Immature Granulocytes 1 %   Abs Immature Granulocytes 0.05 0.00 - 0.07 K/uL    Comment: Performed at [facility] [address], [place], [place] [address]  Troponin I (High Sensitivity)     Status: Abnormal   Collection Time: 08/03/21 12:33 AM  Result Value Ref Range   Troponin I (High Sensitivity) 139 (HH) <18 ng/L    Comment:  CRITICAL RESULT CALLED TO, READ BACK BY AND VERIFIED WITH: FLEMING K,RN 08/03/21 0131 WAYK (NOTE) Elevated high sensitivity troponin I (hsTnI) values and significant  changes across serial measurements may suggest ACS but many other  chronic and acute conditions are known to elevate hsTnI results.  Refer to the Links section for chest pain algorithms and additional  guidance. Performed at [facility], [place] [address] [place], [place] [address]   MRSA Next Gen by PCR, Nasal     Status: None   Collection Time: 08/03/21  4:57 AM   Specimen: Nasal Mucosa; Nasal Swab  Result Value Ref Range   MRSA by PCR Next Gen NOT DETECTED NOT DETECTED    Comment: (NOTE) The GeneXpert MRSA Assay (FDA approved for NASAL specimens only), is one component of a comprehensive MRSA colonization surveillance program. It is not intended to diagnose MRSA infection nor to guide or monitor treatment for MRSA infections. Test performance is not FDA approved in patients less than 15 years old. Performed at [facility], [place] [address] [place], [place] [address]   Lactic acid, plasma     Status: None   Collection Time: 08/03/21  5:50 AM  Result Value Ref Range   Lactic Acid, Venous 1.2 0.5 - 1.9 mmol/L    Comment: Performed at [facility] [address], [place], [place] [address]  Heparin level (unfractionated)     Status: Abnormal   Collection Time: 08/03/21  9:47 AM  Result Value Ref Range   Heparin Unfractionated 0.20 (L) 0.30 - 0.70 IU/mL    Comment: (NOTE) The clinical reportable range upper limit is being lowered to >1.10 to align with the FDA approved guidance for the current laboratory assay.  If heparin results are below expected values, and patient dosage has  been confirmed, suggest follow up testing of antithrombin III levels. Performed at [facility], [place] [address], [place], [place] [address]     CARDIAC CATHETERIZATION  Result Date: 08/02/2021 Conclusions:  Severe three-vessel coronary artery disease including sequential 80-90% proximal and mid LAD stenoses, 70% proximal ramus intermedius lesion, multifocal LCx disease of up to 90% in the mid/distal vessel, and diffusely diseased proximal/mid RCA with up to 50% stenosis as well as sequential 90% ostial and 70% mid RPDA lesions. Moderately-severely elevated left ventricular filling pressure (LVEDP 30-35 mmHg).  LVEF suboptimally assessed but is likely mildly-moderately reduced with anterior hypokinesis. Recommendations: Case discussed with Dr. Cyndia Bent (cardiac surgery).  There is not a clear culprit for the patient's EKG changes.  I suspect that she may have had an ischemic event several days ago with her transient chest pain and is now manifesting predominantly heart  failure symptoms.  We will optimize her heart failure while discussing optimal revascularization strategy. Patient given furosemide 40 mg IV at the end of the procedure.  Continue diuresis as blood pressure and renal function tolerate. Hold home doses of metoprolol and lisinopril for now.  Reinstitute goal-directed medical therapy as tolerated based on blood pressure, renal function, and heart failure. Obtain echocardiogram. Restart IV heparin 2 hours after TR band removal.  Defer adding P2Y12 inhibitor pending cardiac surgery consultation. Aggressive secondary prevention of coronary artery disease. Nelva Bush, MD [facility]  DG CHEST PORT 1 VIEW  Result Date: 08/03/2021 CLINICAL DATA:  Evaluate atelectasis EXAM: PORTABLE CHEST 1 VIEW COMPARISON:  Prior chest x-ray yesterday 08/02/2021 FINDINGS: External defibrillator pads project over the left chest. Cardiac and mediastinal contours are stable. Atherosclerotic calcifications in the transverse aorta. Significantly improved pulmonary edema. Probable trace bilateral pleural effusions and associated atelectasis. The amount of atelectasis has improved. No pneumothorax. No acute osseous abnormality.  IMPRESSION: 1. Interval resolution of pulmonary edema with significant improvement in aeration. 2. Probable trace bilateral pleural effusions and associated bibasilar atelectasis. Electronically Signed   By: Jacqulynn Cadet M.D.   On: 08/03/2021 07:24   DG CHEST PORT 1 VIEW  Result Date: 08/02/2021 CLINICAL DATA:  Pulmonary edema. EXAM: PORTABLE CHEST 1 VIEW COMPARISON:  August 02, 2021 (7:11 p.m.) FINDINGS: Low lung volumes are seen with diffusely increased interstitial lung markings. Mild atelectasis and/or infiltrate is also seen within the bilateral lung bases. This is mildly increased in severity when compared to the prior study. No pleural effusion or pneumothorax is identified. The cardiac silhouette is within the upper limits of normal and stable in size. A radiopaque fusion plate and screws are seen overlying the lower cervical spine. Multilevel degenerative changes are seen throughout the thoracic spine. IMPRESSION: 1. Low lung volumes with mild, stable interstitial edema. 2. Mild bibasilar atelectasis, mildly increased in severity when compared to the prior study. Electronically Signed   By: Virgina Norfolk M.D.   On: 08/02/2021 22:55   DG Chest Port 1 View  Result Date: 08/02/2021 CLINICAL DATA:  Shortness of breath beginning 3 nights ago. EXAM: PORTABLE CHEST 1 VIEW COMPARISON:  02/12/2021 FINDINGS: Artifact overlies the chest. Heart size upper limits of normal. Aortic atherosclerosis is present. There is mild interstitial edema and small pleural effusions, suggesting congestive heart failure. Volume loss at the lung bases secondary to the effusions. No acute bone finding. IMPRESSION: Interstitial edema. Bilateral pleural effusions. Basilar volume loss associated with the fusions. Findings consistent with congestive heart failure. Electronically Signed   By: Nelson Chimes M.D.   On: 08/02/2021 19:18   ECHOCARDIOGRAM COMPLETE  Result Date: 08/03/2021    ECHOCARDIOGRAM REPORT   Patient Name:    DELAYZA LUNGREN Date of Exam: 08/03/2021 Medical Rec #:  [id number]       Height:       63.0 in Accession #:    [id number]      Weight:       150.0 lb Date of Birth:  07/10/1945        BSA:          56.711 m Patient Age:    6 years        BP:           120/66 mmHg Patient Gender: F               HR:           70 bpm. Exam  Location:  Inpatient Procedure: 2D Echo, 3D Echo, Cardiac Doppler, Color Doppler and Intracardiac            Opacification Agent Indications:    [id number] Subsequent ST elevation (STEM) and non-ST elevation                 (NSTEMI) myocardial infarction  History:        Patient has no prior history of Echocardiogram examinations.                 Acute MI and CAD, Signs/Symptoms:Chest Pain and Altered Mental                 Status; Risk Factors:Current Smoker and Dyslipidemia.  Sonographer:    Roseanna Rainbow RDCS Referring Phys: [place]  1. Left ventricular ejection fraction, by estimation, is 30 to 35%. The left ventricle has moderately decreased function. The left ventricle demonstrates regional wall motion abnormalities (see scoring diagram/findings for description). There is moderate asymmetric left ventricular hypertrophy of the basal-septal segment. Left ventricular diastolic parameters are consistent with Grade I diastolic dysfunction (impaired relaxation).  2. LV apical mural thrombus  3. Right ventricular systolic function is normal. The right ventricular size is normal. Tricuspid regurgitation signal is inadequate for assessing PA pressure.  4. The mitral valve is degenerative. Trivial mitral valve regurgitation. No evidence of mitral stenosis. Moderate mitral annular calcification.  5. The aortic valve was not well visualized. Aortic valve regurgitation is trivial. No aortic stenosis is present.  6. The inferior vena cava is normal in size with greater than 50% respiratory variability, suggesting right atrial pressure of 3 mmHg. FINDINGS  Left Ventricle: Left ventricular  ejection fraction, by estimation, is 30 to 35%. The left ventricle has moderately decreased function. The left ventricle demonstrates regional wall motion abnormalities. Definity contrast agent was given IV to delineate the left ventricular endocardial borders. The left ventricular internal cavity size was normal in size. There is moderate asymmetric left ventricular hypertrophy of the basal-septal segment. Left ventricular diastolic parameters are consistent with Grade  I diastolic dysfunction (impaired relaxation).  LV Wall Scoring: The mid and distal anterior septum, entire apex, and mid inferoseptal segment are akinetic. The mid inferolateral segment, mid anterolateral segment, mid anterior segment, and mid inferior segment are hypokinetic. The basal anteroseptal segment, basal inferolateral segment, basal anterolateral segment, basal anterior segment, basal inferior segment, and basal inferoseptal segment are normal. Right Ventricle: The right ventricular size is normal. No increase in right ventricular wall thickness. Right ventricular systolic function is normal. Tricuspid regurgitation signal is inadequate for assessing PA pressure. Left Atrium: Left atrial size was normal in size. Right Atrium: Right atrial size was normal in size. Pericardium: There is no evidence of pericardial effusion. Mitral Valve: The mitral valve is degenerative in appearance. Moderate mitral annular calcification. Trivial mitral valve regurgitation. No evidence of mitral valve stenosis. Tricuspid Valve: The tricuspid valve is normal in structure. Tricuspid valve regurgitation is trivial. Aortic Valve: The aortic valve was not well visualized. Aortic valve regurgitation is trivial. No aortic stenosis is present. Pulmonic Valve: The pulmonic valve was not well visualized. Pulmonic valve regurgitation is not visualized. Aorta: The aortic root and ascending aorta are structurally normal, with no evidence of dilitation. Venous: The  inferior vena cava is normal in size with greater than 50% respiratory variability, suggesting right atrial pressure of 3 mmHg. IAS/Shunts: The interatrial septum was not well visualized.  LEFT VENTRICLE PLAX 2D LVIDd:  5.00 cm     Diastology LVIDs:         4.10 cm     LV e' medial:    3.23 cm/s LV PW:         1.10 cm     LV E/e' medial:  18.0 LV IVS:        0.80 cm     LV e' lateral:   6.23 cm/s LVOT diam:     1.80 cm     LV E/e' lateral: 9.3 LV SV:         49 LV SV Index:   28 LVOT Area:     2.54 cm                             3D Volume EF: LV Volumes (MOD)           3D EF:        34 % LV vol d, MOD A2C: 81.9 ml LV EDV:       92 ml LV vol d, MOD A4C: 90.8 ml LV ESV:       61 ml LV vol s, MOD A2C: 67.6 ml LV SV:        31 ml LV vol s, MOD A4C: 65.2 ml LV SV MOD A2C:     14.3 ml LV SV MOD A4C:     90.8 ml LV SV MOD BP:      18.6 ml RIGHT VENTRICLE             IVC RV S prime:     10.60 cm/s  IVC diam: 1.50 cm TAPSE (M-mode): 1.7 cm LEFT ATRIUM             Index        RIGHT ATRIUM          Index LA Vol (A2C):   31.2 ml 18.23 ml/m  RA Area:     9.36 cm LA Vol (A4C):   39.1 ml 22.85 ml/m  RA Volume:   18.90 ml 11.05 ml/m LA Biplane Vol: 35.5 ml 20.75 ml/m  AORTIC VALVE LVOT Vmax:   96.40 cm/s LVOT Vmean:  61.300 cm/s LVOT VTI:    0.191 m  AORTA Ao Root diam: 3.10 cm Ao Asc diam:  3.50 cm MITRAL VALVE MV Area (PHT): 3.53 cm    SHUNTS MV Decel Time: 215 msec    Systemic VTI:  0.19 m MV E velocity: 58.20 cm/s  Systemic Diam: 1.80 cm MV A velocity: 96.40 cm/s MV E/A ratio:  0.60 Oswaldo Milian MD Electronically signed by Oswaldo Milian MD Signature Date/Time: 08/03/2021/1:23:48 PM    Final     Review of Systems  Constitutional:  Positive for activity change and fatigue.  HENT: Negative.    Eyes: Negative.   Respiratory:  Positive for shortness of breath.   Cardiovascular:  Positive for chest pain. Negative for leg swelling.  Gastrointestinal: Negative.   Endocrine: Negative.    Genitourinary: Negative.   Musculoskeletal:  Positive for arthralgias, back pain, gait problem and neck pain.  Skin: Negative.   Allergic/Immunologic: Negative.   Neurological:  Negative for dizziness and syncope.  Hematological: Negative.   Psychiatric/Behavioral:  The patient is nervous/anxious.   Blood pressure (!) 99/50, pulse 67, temperature 98.5 F (36.9 C), resp. rate 17, height '5\' 3"'$  (1.6 m), weight 68 kg, SpO2 97 %. Physical Exam Constitutional:      Appearance: Normal appearance.  She is normal weight.  HENT:     Head: Normocephalic and atraumatic.  Eyes:     Extraocular Movements: Extraocular movements intact.     Conjunctiva/sclera: Conjunctivae normal.     Pupils: Pupils are equal, round, and reactive to light.  Cardiovascular:     Rate and Rhythm: Normal rate and regular rhythm.     Pulses: Normal pulses.     Heart sounds: Normal heart sounds. No murmur heard. Pulmonary:     Effort: Pulmonary effort is normal.     Comments: Decreased BS in lung bases Abdominal:     General: Abdomen is flat. Bowel sounds are normal.     Palpations: Abdomen is soft.     Tenderness: There is no abdominal tenderness.  Musculoskeletal:        General: No swelling.     Cervical back: Normal range of motion and neck supple.  Skin:    General: Skin is warm and dry.  Neurological:     General: No focal deficit present.     Mental Status: She is alert and oriented to person, place, and time.  Psychiatric:        Mood and Affect: Mood normal.        Behavior: Behavior normal.   Physicians  Panel Physicians Referring Physician Case Authorizing Physician  End, Harrell Gave, MD (Primary)     Procedures  LEFT HEART CATH AND CORONARY ANGIOGRAPHY   Conclusion  Conclusions: Severe three-vessel coronary artery disease including sequential 80-90% proximal and mid LAD stenoses, 70% proximal ramus intermedius lesion, multifocal LCx disease of up to 90% in the mid/distal vessel, and  diffusely diseased proximal/mid RCA with up to 50% stenosis as well as sequential 90% ostial and 70% mid RPDA lesions. Moderately-severely elevated left ventricular filling pressure (LVEDP 30-35 mmHg).  LVEF suboptimally assessed but is likely mildly-moderately reduced with anterior hypokinesis.   Recommendations: Case discussed with Dr. Cyndia Bent (cardiac surgery).  There is not a clear culprit for the patient's EKG changes.  I suspect that she may have had an ischemic event several days ago with her transient chest pain and is now manifesting predominantly heart failure symptoms.  We will optimize her heart failure while discussing optimal revascularization strategy. Patient given furosemide 40 mg IV at the end of the procedure.  Continue diuresis as blood pressure and renal function tolerate. Hold home doses of metoprolol and lisinopril for now.  Reinstitute goal-directed medical therapy as tolerated based on blood pressure, renal function, and heart failure. Obtain echocardiogram. Restart IV heparin 2 hours after TR band removal.  Defer adding P2Y12 inhibitor pending cardiac surgery consultation. Aggressive secondary prevention of coronary artery disease.   Nelva Bush, MD [facility]     Recommendations  Antiplatelet/Anticoag Continue indefinite aspirin 81 mg daily.  Defer adding P2Y12 inhibitor pending cardiac surgery consultation.  Restart IV heparin 2 hours after Zephyr band removal.  Discharge Date Anticipated discharge date to be determined.   Indications  ST elevation myocardial infarction (STEMI) involving other coronary artery (HCC) [I21.29 (ICD-10-CM)]   Clinical Presentation  CHF/Shock Congestive heart failure present. NYHA Class IV. No shock present.   Procedural Details  Technical Details Indication: 76 y.o. year-old woman with history of hypertension, hyperlipidemia, arthritis, and lymphoma in remission, presenting from [facility] with concern for STEMI.   She had an episode of severe chest pain 5 days ago that resolved on its own with subsequent shortness of breath that has worsened over the last 4 days.  She  currently complains of dyspnea but has not had any further chest pain.  EKG at [facility] showed subtle inferior and anterolateral ST segment elevation, prompting referral for emergent cardiac catheterization and possible PCI.  GFR: >60 ml/min  Procedure: The risks, benefits, complications, treatment options, and expected outcomes were discussed with the patient.  The patient provided emergent verbal consent. The right wrist was prepped and draped in a sterile fashion. 1% lidocaine was used for local anesthesia. Using the modified Seldinger access technique, a 53F slender Glidesheath was placed in the right radial artery. 3 mg Verapamil was given through the sheath. Heparin 3,000 units were administered.  Selective coronary angiography was performed using a 73F TIG4.0 catheter to engage the left coronary artery and a 73F TIG4.0 catheter to engage the right coronary artery. Left heart catheterization was performed using a 73F angled pigtail catheter. Left ventriculogram was performed with a hand injection of contrast.  At the end of the procedure, the radial artery sheath was removed and a Zephyr band applied to achieve patent hemostasis. There were no immediate complications. The patient was taken to the ICU in stable condition.   Estimated blood loss <50 mL.   During this procedure medications were administered to achieve and maintain moderate conscious sedation while the patient's heart rate, blood pressure, and oxygen saturation were continuously monitored and I was present face-to-face 100% of this time.   Medications (Filter: Administrations occurring from 2010 to 2050 on 08/02/21)  important  Continuous medications are totaled by the amount administered until 08/02/21 2050.   lidocaine (PF) (XYLOCAINE) 1 % injection (mL) Total volume:  2  mL Date/Time Rate/Dose/Volume Action   08/02/21 2022 2 mL Given    Radial Cocktail/Verapamil only (mL) Total volume:  10 mL Date/Time Rate/Dose/Volume Action   08/02/21 2022 10 mL Given    fentaNYL (SUBLIMAZE) injection (mcg) Total dose:  25 mcg Date/Time Rate/Dose/Volume Action   08/02/21 2023 25 mcg Given    midazolam (VERSED) injection (mg) Total dose:  1 mg Date/Time Rate/Dose/Volume Action   08/02/21 2023 1 mg Given    heparin sodium (porcine) injection (Units) Total dose:  3,000 Units Date/Time Rate/Dose/Volume Action   08/02/21 2023 3,000 Units Given    Heparin (Porcine) in NaCl 1000-0.9 UT/500ML-% SOLN (mL) Total volume:  1,000 mL Date/Time Rate/Dose/Volume Action   08/02/21 2034 500 mL Given   2034 500 mL Given    furosemide (LASIX) injection (mg) Total dose:  40 mg Date/Time Rate/Dose/Volume Action   08/02/21 2037 40 mg Given    iohexol (OMNIPAQUE) 350 MG/ML injection (mL) Total volume:  65 mL Date/Time Rate/Dose/Volume Action   08/02/21 2050 65 mL Given    Sedation Time  Sedation Time Physician-1: 10 minutes 59 seconds Radiation/Fluoro  Fluoro time: 4.2 (min) DAP: 02409 (mGycm2) Cumulative Air Kerma: 735 (mGy) Complications  Complications documented before study signed (08/02/2021  3:29 PM)   No complications were associated with this study.  Documented by Bethann Punches, RN - 08/02/2021  9:01 PM     Coronary Findings  Diagnostic Dominance: Right Left Main  Vessel is large. Vessel is angiographically normal.    Left Anterior Descending  Vessel is large.  Prox LAD lesion is 90% stenosed. The lesion is moderately calcified.  Mid LAD-1 lesion is 80% stenosed. The lesion is mildly calcified.  Mid LAD-2 lesion is 90% stenosed. The lesion is moderately calcified.    First Diagonal Branch  Vessel is moderate in size.    Ramus Intermedius  Vessel is small.  Ramus lesion is 70% stenosed.    Left Circumflex  Vessel is large.  Ost Cx to Mid  Cx lesion is 50% stenosed.  Mid Cx lesion is 70% stenosed. The lesion is irregular.  Dist Cx lesion is 90% stenosed. The lesion is focal and concentric.    First Obtuse Marginal Branch  Vessel is moderate in size.    Second Obtuse Marginal Branch  Vessel is small in size.    Third Obtuse Marginal Branch  Vessel is small in size.    Right Coronary Artery  Vessel is large.  Prox RCA-1 lesion is 50% stenosed.  Prox RCA-2 lesion is 40% stenosed.  Mid RCA lesion is 50% stenosed.  Dist RCA lesion is 30% stenosed.    Right Posterior Descending Artery  RPDA-1 lesion is 90% stenosed.  RPDA-2 lesion is 70% stenosed.    Intervention   No interventions have been documented.   Left Heart  Left Ventricle LV end diastolic pressure is moderately elevated. LVEDP 30-35 mmHg. Left ventricular systolic function suboptimally evaluated due to incomplete opacification during left ventriculogram.  There appears to be mid/apical anterior hypokinesis with mildly-moderately reduced LVEF.  Aortic Valve There is no aortic valve stenosis.   Coronary Diagrams  Diagnostic Dominance: Right Intervention  Implants     No implant documentation for this case.   Syngo Images   Show images for CARDIAC CATHETERIZATION Images on [employer]   Show images for Daisia, [place] to Procedure Log  Procedure Log    Hemo Data  Flowsheet Row Most Recent Value  AO Systolic Pressure 720 mmHg  AO Diastolic Pressure 64 mmHg  AO Mean 947 mmHg  LV Systolic Pressure 096 mmHg  LV Diastolic Pressure 11 mmHg  LV EDP 30 mmHg  AOp Systolic Pressure 283 mmHg  AOp Diastolic Pressure 68 mmHg  AOp Mean Pressure 99 mmHg  LVp Systolic Pressure 662 mmHg  LVp Diastolic Pressure 10 mmHg  LVp EDP Pressure 36 mmHg       ECHOCARDIOGRAM REPORT         Patient Name:   [name] Date of Exam: 08/03/2021  Medical Rec #:  [id number]       Height:       63.0 in  Accession #:    [id number]      Weight:        150.0 lb  Date of Birth:  30-Nov-1945        BSA:          20.711 m  Patient Age:    72 years        BP:           120/66 mmHg  Patient Gender: F               HR:           70 bpm.  Exam Location:  Inpatient   Procedure: 2D Echo, 3D Echo, Cardiac Doppler, Color Doppler and  Intracardiac             Opacification Agent   Indications:    122-I22.9 Subsequent ST elevation (STEM) and non-ST  elevation                  (NSTEMI) myocardial infarction     History:        Patient has no prior history of Echocardiogram  examinations.  Acute MI and CAD, Signs/Symptoms:Chest Pain and Altered  Mental                  Status; Risk Factors:Current Smoker and Dyslipidemia.     Sonographer:    Roseanna Rainbow RDCS  Referring Phys: Ellis     1. Left ventricular ejection fraction, by estimation, is 30 to 35%. The  left ventricle has moderately decreased function. The left ventricle  demonstrates regional wall motion abnormalities (see scoring  diagram/findings for description). There is  moderate asymmetric left ventricular hypertrophy of the basal-septal  segment. Left ventricular diastolic parameters are consistent with Grade I  diastolic dysfunction (impaired relaxation).   2. LV apical mural thrombus   3. Right ventricular systolic function is normal. The right ventricular  size is normal. Tricuspid regurgitation signal is inadequate for assessing  PA pressure.   4. The mitral valve is degenerative. Trivial mitral valve regurgitation.  No evidence of mitral stenosis. Moderate mitral annular calcification.   5. The aortic valve was not well visualized. Aortic valve regurgitation  is trivial. No aortic stenosis is present.   6. The inferior vena cava is normal in size with greater than 50%  respiratory variability, suggesting right atrial pressure of 3 mmHg.   FINDINGS   Left Ventricle: Left ventricular ejection fraction, by estimation, is 30  to  35%. The left ventricle has moderately decreased function. The left  ventricle demonstrates regional wall motion abnormalities. Definity  contrast agent was given IV to delineate  the left ventricular endocardial borders. The left ventricular internal  cavity size was normal in size. There is moderate asymmetric left  ventricular hypertrophy of the basal-septal segment. Left ventricular  diastolic parameters are consistent with Grade   I diastolic dysfunction (impaired relaxation).      LV Wall Scoring:  The mid and distal anterior septum, entire apex, and mid inferoseptal  segment  are akinetic. The mid inferolateral segment, mid anterolateral segment,  mid  anterior segment, and mid inferior segment are hypokinetic. The basal  anteroseptal segment, basal inferolateral segment, basal anterolateral  segment, basal anterior segment, basal inferior segment, and basal  inferoseptal segment are normal.   Right Ventricle: The right ventricular size is normal. No increase in  right ventricular wall thickness. Right ventricular systolic function is  normal. Tricuspid regurgitation signal is inadequate for assessing PA  pressure.   Left Atrium: Left atrial size was normal in size.   Right Atrium: Right atrial size was normal in size.   Pericardium: There is no evidence of pericardial effusion.   Mitral Valve: The mitral valve is degenerative in appearance. Moderate  mitral annular calcification. Trivial mitral valve regurgitation. No  evidence of mitral valve stenosis.   Tricuspid Valve: The tricuspid valve is normal in structure. Tricuspid  valve regurgitation is trivial.   Aortic Valve: The aortic valve was not well visualized. Aortic valve  regurgitation is trivial. No aortic stenosis is present.   Pulmonic Valve: The pulmonic valve was not well visualized. Pulmonic valve  regurgitation is not visualized.   Aorta: The aortic root and ascending aorta are structurally normal,  with  no evidence of dilitation.   Venous: The inferior vena cava is normal in size with greater than 50%  respiratory variability, suggesting right atrial pressure of 3 mmHg.   IAS/Shunts: The interatrial septum was not well visualized.      LEFT VENTRICLE  PLAX 2D  LVIDd:  5.00 cm     Diastology  LVIDs:         4.10 cm     LV e' medial:    3.23 cm/s  LV PW:         1.10 cm     LV E/e' medial:  18.0  LV IVS:        0.80 cm     LV e' lateral:   6.23 cm/s  LVOT diam:     1.80 cm     LV E/e' lateral: 9.3  LV SV:         49  LV SV Index:   28  LVOT Area:     2.54 cm                                3D Volume EF:  LV Volumes (MOD)           3D EF:        34 %  LV vol d, MOD A2C: 81.9 ml LV EDV:       92 ml  LV vol d, MOD A4C: 90.8 ml LV ESV:       61 ml  LV vol s, MOD A2C: 67.6 ml LV SV:        31 ml  LV vol s, MOD A4C: 65.2 ml  LV SV MOD A2C:     14.3 ml  LV SV MOD A4C:     90.8 ml  LV SV MOD BP:      18.6 ml   RIGHT VENTRICLE             IVC  RV S prime:     10.60 cm/s  IVC diam: 1.50 cm  TAPSE (M-mode): 1.7 cm   LEFT ATRIUM             Index        RIGHT ATRIUM          Index  LA Vol (A2C):   31.2 ml 18.23 ml/m  RA Area:     9.36 cm  LA Vol (A4C):   39.1 ml 22.85 ml/m  RA Volume:   18.90 ml 11.05 ml/m  LA Biplane Vol: 35.5 ml 20.75 ml/m   AORTIC VALVE  LVOT Vmax:   96.40 cm/s  LVOT Vmean:  61.300 cm/s  LVOT VTI:    0.191 m     AORTA  Ao Root diam: 3.10 cm  Ao Asc diam:  3.50 cm   MITRAL VALVE  MV Area (PHT): 3.53 cm    SHUNTS  MV Decel Time: 215 msec    Systemic VTI:  0.19 m  MV E velocity: 58.20 cm/s  Systemic Diam: 1.80 cm  MV A velocity: 96.40 cm/s  MV E/A ratio:  0.60   Oswaldo Milian MD  Electronically signed by Oswaldo Milian MD  Signature Date/Time: 08/03/2021/1:23:48 PM         Final      Assessment/Plan:  This 76 year old woman has severe three-vessel coronary disease with ischemic cardiomyopathy presenting with a late  anterolateral STEMI and class IV heart failure symptoms with pulmonary edema and pleural effusions on chest x-ray and LVEDP of 30.  Her ejection fraction was read as 30 to 35% on echocardiogram today but I have personally reviewed that study and I think it is probably in the 20% range.  The only part of her heart that moves is  in the basilar segments and she has apical thrombus.  I have personally reviewed her cardiac catheterization films.  Her LAD is probably graftable but small.  I am not sure that the myocardium supplied by her distal LAD is still viable.  The diagonal branch is not large enough to graft and is significantly smaller than the diagnostic catheter.  Her left circumflex is not graftable due to its small size.  Her PDA is graftable.  It is also not clear to me that her distal inferior wall is viable.  I think it would be worthwhile doing a cardiac MR viability study on this patient.  I also think she would have a very difficult time recovering from coronary bypass surgery given the combination of her age, severe spinal stenosis with significant limitation of her mobility and chronic high-dose narcotic use in addition to multiple other pain relievers and antianxiety agents.  I would recommend doing a viability study first and considering whether PCI is an option for her.  If she has viable myocardium in the distal LAD and PDA territory and is not thought to be a candidate for PCI then we will have to have a discussion about coronary bypass surgery and the high risk and very prolonged recovery that is likely.  Gaye Pollack 08/03/2021

## 2021-08-03 NOTE — Progress Notes (Signed)
ANTICOAGULATION CONSULT NOTE  Pharmacy Consult for heparin Indication: chest pain/ACS  Allergies  Allergen Reactions   Morphine Rash    Patient Measurements: Height: '5\' 3"'$  (160 cm) Weight: 68 kg (150 lb) IBW/kg (Calculated) : 52.4 Heparin Dosing Weight: 68kg  Vital Signs: Temp: 96.6 F (35.9 C) (06/04 0800) Temp Source: Axillary (06/04 0800) BP: 126/63 (06/04 1100) Pulse Rate: 65 (06/04 1100)  Labs: Recent Labs    08/02/21 1906 08/02/21 2135 08/03/21 0033 08/03/21 0947  HGB 13.4  --  14.1  --   HCT 40.6  --  41.9  --   PLT 151  --  172  --   APTT 26  --   --   --   LABPROT 12.2  --   --   --   INR 0.9  --   --   --   HEPARINUNFRC  --   --   --  0.20*  CREATININE 0.92  --  0.92  --   TROPONINIHS 53* 78* 139*  --      Estimated Creatinine Clearance: 47.4 mL/min (by C-G formula based on SCr of 0.92 mg/dL).   Medical History: Past Medical History:  Diagnosis Date   Arthritis    Hx of non-Hodgkin's lymphoma    Hypertension      Assessment: 62 yoF admitted as code STEMI. Pt s/p LHC with mvCAD, planning CABG workup. Pharmacy dose IV heparin no AC prior to admission. -heparin level= 0.2 on 850 units/hr  Goal of Therapy:  Heparin level 0.3-0.7 units/ml Monitor platelets by anticoagulation protocol: Yes   Plan:  -Increase heparin to 1000 units/hr -Heparin level in 8 hours and daily wth CBC daily  Hildred Laser, PharmD Clinical Pharmacist **Pharmacist phone directory can now be found on [email or web address] (PW [email or web address]).  Listed under [place].

## 2021-08-03 NOTE — Progress Notes (Incomplete)
Progress Note  Patient Name: [name] Date of Encounter: 08/04/2021  [place] Cardiologist: Gerald Stabs End  Subjective   Breathing is significantly improved.  She had a massive diuresis of greater than 6 L since admission.  No chest pain.  Shortness of breath is resolved.  Inpatient Medications    Scheduled Meds:  aspirin EC  81 mg Oral Daily   atorvastatin  80 mg Oral QHS   carvedilol  3.125 mg Oral BID WC   Chlorhexidine Gluconate Cloth  6 each Topical Daily   dapagliflozin propanediol  10 mg Oral Daily   furosemide  40 mg Intravenous BID   gabapentin  800 mg Oral TID   polyethylene glycol  17 g Oral BID   sacubitril-valsartan  1 tablet Oral BID   sodium chloride flush  3 mL Intravenous Q12H   [START ON 08/05/2021] spironolactone  12.5 mg Oral Daily   Continuous Infusions:  sodium chloride Stopped (08/03/21 0151)   sodium chloride     heparin 950 Units/hr (08/04/21 1000)   PRN Meds: sodium chloride, acetaminophen, ALPRAZolam, bisacodyl, magnesium hydroxide, melatonin, nitroGLYCERIN, ondansetron (ZOFRAN) IV, oxyCODONE, sodium chloride flush   Vital Signs    Vitals:   08/04/21 0800 08/04/21 0810 08/04/21 0900 08/04/21 1000  BP:  124/64  92/63  Pulse: 95 78 78 (!) 112  Resp: 19 (!) '21 16 12  '$ Temp: 98.2 F (36.8 C)     TempSrc: Oral     SpO2: 100% 96% 95% 94%  Weight:      Height:        Intake/Output Summary (Last 24 hours) at 08/04/2021 1208 Last data filed at 08/04/2021 1000 Gross per 24 hour  Intake 1438.96 ml  Output 3521 ml  Net -2082.04 ml      08/02/2021    6:38 PM 07/21/2021    4:23 PM 04/17/2021    2:36 PM  Last 3 Weights  Weight (lbs) 150 lb 147 lb 144 lb  Weight (kg) 68.04 kg 66.679 kg 65.318 kg      Telemetry    Sinus rhythm without significant ectopy.- Personally Reviewed  ECG    Persistent diffuse ST elevation.  No ongoing chest pain.  Differential is pericarditis versus aneurysm.- Personally Reviewed  Physical Exam   Pleasant GEN: No acute distress.   Neck: No JVD Cardiac: RRR, no murmurs, rubs, or gallops.  Respiratory: Clear to auscultation bilaterally. GI: Soft, nontender, non-distended  MS: No edema; No deformity. Neuro:  Nonfocal  Psych: Normal affect   Labs    High Sensitivity Troponin:   Recent Labs  Lab 08/02/21 1906 08/02/21 2135 08/03/21 0033  TROPONINIHS 53* 78* 139*     Chemistry Recent Labs  Lab 08/02/21 1906 08/02/21 2135 08/03/21 0033 08/04/21 0443  NA 140  --  142 139  K 3.7  --  3.6 4.2  CL 107  --  103 101  CO2 29  --  31 30  GLUCOSE 143*  --  123* 107*  BUN 20  --  17 20  CREATININE 0.92  --  0.92 1.06*  CALCIUM 8.8*  --  8.8* 9.5  MG  --  2.2 2.0 2.3  PROT 6.3*  --  5.9*  --   ALBUMIN 3.7  --  3.4*  --   AST 18  --  22  --   ALT 12  --  14  --   ALKPHOS 47  --  49  --   BILITOT 0.6  --  0.4  --   GFRNONAA >60  --  >60 54*  ANIONGAP 4*  --  8 8    Lipids  Recent Labs  Lab 08/02/21 1906  CHOL 153  TRIG 124  HDL 48  LDLCALC 80  CHOLHDL 3.2    Hematology Recent Labs  Lab 08/02/21 1906 08/03/21 0033 08/04/21 0443  WBC 6.4 9.1 8.4  RBC 4.39 4.59 4.66  HGB 13.4 14.1 14.3  HCT 40.6 41.9 42.4  MCV 92.5 91.3 91.0  MCH 30.5 30.7 30.7  MCHC 33.0 33.7 33.7  RDW 13.9 14.0 14.0  PLT 151 172 163   Thyroid No results for input(s): TSH, FREET4 in the last 168 hours.  BNPNo results for input(s): BNP, PROBNP in the last 168 hours.  DDimer No results for input(s): DDIMER in the last 168 hours.   Radiology    CARDIAC CATHETERIZATION  Result Date: 08/02/2021 Conclusions: Severe three-vessel coronary artery disease including sequential 80-90% proximal and mid LAD stenoses, 70% proximal ramus intermedius lesion, multifocal LCx disease of up to 90% in the mid/distal vessel, and diffusely diseased proximal/mid RCA with up to 50% stenosis as well as sequential 90% ostial and 70% mid RPDA lesions. Moderately-severely elevated left ventricular filling pressure  (LVEDP 30-35 mmHg).  LVEF suboptimally assessed but is likely mildly-moderately reduced with anterior hypokinesis. Recommendations: Case discussed with Dr. Cyndia Bent (cardiac surgery).  There is not a clear culprit for the patient's EKG changes.  I suspect that she may have had an ischemic event several days ago with her transient chest pain and is now manifesting predominantly heart failure symptoms.  We will optimize her heart failure while discussing optimal revascularization strategy. Patient given furosemide 40 mg IV at the end of the procedure.  Continue diuresis as blood pressure and renal function tolerate. Hold home doses of metoprolol and lisinopril for now.  Reinstitute goal-directed medical therapy as tolerated based on blood pressure, renal function, and heart failure. Obtain echocardiogram. Restart IV heparin 2 hours after TR band removal.  Defer adding P2Y12 inhibitor pending cardiac surgery consultation. Aggressive secondary prevention of coronary artery disease. Nelva Bush, MD [facility]  DG CHEST PORT 1 VIEW  Result Date: 08/03/2021 CLINICAL DATA:  Evaluate atelectasis EXAM: PORTABLE CHEST 1 VIEW COMPARISON:  Prior chest x-ray yesterday 08/02/2021 FINDINGS: External defibrillator pads project over the left chest. Cardiac and mediastinal contours are stable. Atherosclerotic calcifications in the transverse aorta. Significantly improved pulmonary edema. Probable trace bilateral pleural effusions and associated atelectasis. The amount of atelectasis has improved. No pneumothorax. No acute osseous abnormality. IMPRESSION: 1. Interval resolution of pulmonary edema with significant improvement in aeration. 2. Probable trace bilateral pleural effusions and associated bibasilar atelectasis. Electronically Signed   By: Jacqulynn Cadet M.D.   On: 08/03/2021 07:24   DG CHEST PORT 1 VIEW  Result Date: 08/02/2021 CLINICAL DATA:  Pulmonary edema. EXAM: PORTABLE CHEST 1 VIEW COMPARISON:  August 02, 2021 (7:11 p.m.) FINDINGS: Low lung volumes are seen with diffusely increased interstitial lung markings. Mild atelectasis and/or infiltrate is also seen within the bilateral lung bases. This is mildly increased in severity when compared to the prior study. No pleural effusion or pneumothorax is identified. The cardiac silhouette is within the upper limits of normal and stable in size. A radiopaque fusion plate and screws are seen overlying the lower cervical spine. Multilevel degenerative changes are seen throughout the thoracic spine. IMPRESSION: 1. Low lung volumes with mild, stable interstitial edema. 2. Mild bibasilar atelectasis, mildly increased in severity  when compared to the prior study. Electronically Signed   By: Virgina Norfolk M.D.   On: 08/02/2021 22:55   DG Chest Port 1 View  Result Date: 08/02/2021 CLINICAL DATA:  Shortness of breath beginning 3 nights ago. EXAM: PORTABLE CHEST 1 VIEW COMPARISON:  02/12/2021 FINDINGS: Artifact overlies the chest. Heart size upper limits of normal. Aortic atherosclerosis is present. There is mild interstitial edema and small pleural effusions, suggesting congestive heart failure. Volume loss at the lung bases secondary to the effusions. No acute bone finding. IMPRESSION: Interstitial edema. Bilateral pleural effusions. Basilar volume loss associated with the fusions. Findings consistent with congestive heart failure. Electronically Signed   By: Nelson Chimes M.D.   On: 08/02/2021 19:18   ECHOCARDIOGRAM COMPLETE  Result Date: 08/03/2021    ECHOCARDIOGRAM REPORT   Patient Name:   SHERRIANN SZUCH Date of Exam: 08/03/2021 Medical Rec #:  [id number]       Height:       63.0 in Accession #:    [id number]      Weight:       150.0 lb Date of Birth:  Jun 07, 1945        BSA:          9.711 m Patient Age:    76 years        BP:           120/66 mmHg Patient Gender: F               HR:           70 bpm. Exam Location:  Inpatient Procedure: 2D Echo, 3D Echo, Cardiac Doppler,  Color Doppler and Intracardiac            Opacification Agent Indications:    122-I22.9 Subsequent ST elevation (STEM) and non-ST elevation                 (NSTEMI) myocardial infarction  History:        Patient has no prior history of Echocardiogram examinations.                 Acute MI and CAD, Signs/Symptoms:Chest Pain and Altered Mental                 Status; Risk Factors:Current Smoker and Dyslipidemia.  Sonographer:    Roseanna Rainbow RDCS Referring Phys: [place]  1. Left ventricular ejection fraction, by estimation, is 30 to 35%. The left ventricle has moderately decreased function. The left ventricle demonstrates regional wall motion abnormalities (see scoring diagram/findings for description). There is moderate asymmetric left ventricular hypertrophy of the basal-septal segment. Left ventricular diastolic parameters are consistent with Grade I diastolic dysfunction (impaired relaxation).  2. LV apical mural thrombus  3. Right ventricular systolic function is normal. The right ventricular size is normal. Tricuspid regurgitation signal is inadequate for assessing PA pressure.  4. The mitral valve is degenerative. Trivial mitral valve regurgitation. No evidence of mitral stenosis. Moderate mitral annular calcification.  5. The aortic valve was not well visualized. Aortic valve regurgitation is trivial. No aortic stenosis is present.  6. The inferior vena cava is normal in size with greater than 50% respiratory variability, suggesting right atrial pressure of 3 mmHg. FINDINGS  Left Ventricle: Left ventricular ejection fraction, by estimation, is 30 to 35%. The left ventricle has moderately decreased function. The left ventricle demonstrates regional wall motion abnormalities. Definity contrast agent was given IV to delineate the left ventricular endocardial borders. The left  ventricular internal cavity size was normal in size. There is moderate asymmetric left ventricular hypertrophy of the  basal-septal segment. Left ventricular diastolic parameters are consistent with Grade  I diastolic dysfunction (impaired relaxation).  LV Wall Scoring: The mid and distal anterior septum, entire apex, and mid inferoseptal segment are akinetic. The mid inferolateral segment, mid anterolateral segment, mid anterior segment, and mid inferior segment are hypokinetic. The basal anteroseptal segment, basal inferolateral segment, basal anterolateral segment, basal anterior segment, basal inferior segment, and basal inferoseptal segment are normal. Right Ventricle: The right ventricular size is normal. No increase in right ventricular wall thickness. Right ventricular systolic function is normal. Tricuspid regurgitation signal is inadequate for assessing PA pressure. Left Atrium: Left atrial size was normal in size. Right Atrium: Right atrial size was normal in size. Pericardium: There is no evidence of pericardial effusion. Mitral Valve: The mitral valve is degenerative in appearance. Moderate mitral annular calcification. Trivial mitral valve regurgitation. No evidence of mitral valve stenosis. Tricuspid Valve: The tricuspid valve is normal in structure. Tricuspid valve regurgitation is trivial. Aortic Valve: The aortic valve was not well visualized. Aortic valve regurgitation is trivial. No aortic stenosis is present. Pulmonic Valve: The pulmonic valve was not well visualized. Pulmonic valve regurgitation is not visualized. Aorta: The aortic root and ascending aorta are structurally normal, with no evidence of dilitation. Venous: The inferior vena cava is normal in size with greater than 50% respiratory variability, suggesting right atrial pressure of 3 mmHg. IAS/Shunts: The interatrial septum was not well visualized.  LEFT VENTRICLE PLAX 2D LVIDd:         5.00 cm     Diastology LVIDs:         4.10 cm     LV e' medial:    3.23 cm/s LV PW:         1.10 cm     LV E/e' medial:  18.0 LV IVS:        0.80 cm     LV e' lateral:    6.23 cm/s LVOT diam:     1.80 cm     LV E/e' lateral: 9.3 LV SV:         49 LV SV Index:   28 LVOT Area:     2.54 cm                             3D Volume EF: LV Volumes (MOD)           3D EF:        34 % LV vol d, MOD A2C: 81.9 ml LV EDV:       92 ml LV vol d, MOD A4C: 90.8 ml LV ESV:       61 ml LV vol s, MOD A2C: 67.6 ml LV SV:        31 ml LV vol s, MOD A4C: 65.2 ml LV SV MOD A2C:     14.3 ml LV SV MOD A4C:     90.8 ml LV SV MOD BP:      18.6 ml RIGHT VENTRICLE             IVC RV S prime:     10.60 cm/s  IVC diam: 1.50 cm TAPSE (M-mode): 1.7 cm LEFT ATRIUM             Index        RIGHT ATRIUM  Index LA Vol (A2C):   31.2 ml 18.23 ml/m  RA Area:     9.36 cm LA Vol (A4C):   39.1 ml 22.85 ml/m  RA Volume:   18.90 ml 11.05 ml/m LA Biplane Vol: 35.5 ml 20.75 ml/m  AORTIC VALVE LVOT Vmax:   96.40 cm/s LVOT Vmean:  61.300 cm/s LVOT VTI:    0.191 m  AORTA Ao Root diam: 3.10 cm Ao Asc diam:  3.50 cm MITRAL VALVE MV Area (PHT): 3.53 cm    SHUNTS MV Decel Time: 215 msec    Systemic VTI:  0.19 m MV E velocity: 58.20 cm/s  Systemic Diam: 1.80 cm MV A velocity: 96.40 cm/s MV E/A ratio:  0.60 Oswaldo Milian MD Electronically signed by Oswaldo Milian MD Signature Date/Time: 08/03/2021/1:23:48 PM    Final     Cardiac Studies   ECHOCARDIOGRAM 08/02/2021: IMPRESSIONS     1. Left ventricular ejection fraction, by estimation, is 30 to 35%. The  left ventricle has moderately decreased function. The left ventricle  demonstrates regional wall motion abnormalities (see scoring  diagram/findings for description). There is  moderate asymmetric left ventricular hypertrophy of the basal-septal  segment. Left ventricular diastolic parameters are consistent with Grade I  diastolic dysfunction (impaired relaxation).   2. LV apical mural thrombus   3. Right ventricular systolic function is normal. The right ventricular  size is normal. Tricuspid regurgitation signal is inadequate for assessing  PA  pressure.   4. The mitral valve is degenerative. Trivial mitral valve regurgitation.  No evidence of mitral stenosis. Moderate mitral annular calcification.   5. The aortic valve was not well visualized. Aortic valve regurgitation  is trivial. No aortic stenosis is present.   6. The inferior vena cava is normal in size with greater than 50%  respiratory variability, suggesting right atrial pressure of 3 mmHg.   Cardiac Cath 3, 2023: Diagnostic Dominance: Right    Elevated LVEDP 30-35 mmHg  Patient Profile     76 y.o. female HTN, HLD, and tobacco use, who presented to [facility] with 3 days of SOB and CP and ECG c/w possible anterolateral STEMI which at cath demonstrated severe tandem LAD plus severe RCA and CFX disease. Presented with CHF/pulmonary edema. TCTS consulted and favors PCI.  LV mural thrombus.  Viability study is pending.  Assessment & Plan    Anterior MI, late presentation: Uncertain of timing of inferior infarct but predated acute presentation. CAD, native: Diffuse three-vessel coronary disease that could be surgical if there is significant myocardium.  Aggressive risk factor modification.  LDL less than 55.  If viable myocardium is noted on MRI, surgery versus PCI are considerations.  The only reasonable PCI target would be the LAD. There is viable antero-basal and mid anterior wall. Distal ands apical likely conplete scar. Bilateral carotid disease: Noted Acute systolic HF: Needs aggressive GDMT.  Decrease voltage V1-3. Absence of S3 gallop on exam are clinical features that lead to optimism concerning viability study.  Acute volume overload has resolved. LV apical thrombus: Anticoagulation until management strategy determined.   Viability study.  Titrate heart failure therapy.  Aggressive lipid-lowering.  Anticoagulation for LV thrombus.  For questions or updates, please contact [place] Please consult [email or web address] for contact info under        Signed, Sinclair Grooms, MD  08/04/2021, 12:08 PM

## 2021-08-04 ENCOUNTER — Inpatient Hospital Stay (HOSPITAL_COMMUNITY): Payer: Medicare Other

## 2021-08-04 ENCOUNTER — Other Ambulatory Visit (HOSPITAL_COMMUNITY): Payer: Self-pay

## 2021-08-04 ENCOUNTER — Encounter (HOSPITAL_COMMUNITY): Payer: Self-pay | Admitting: Internal Medicine

## 2021-08-04 DIAGNOSIS — I5021 Acute systolic (congestive) heart failure: Secondary | ICD-10-CM | POA: Diagnosis not present

## 2021-08-04 DIAGNOSIS — E785 Hyperlipidemia, unspecified: Secondary | ICD-10-CM

## 2021-08-04 DIAGNOSIS — I513 Intracardiac thrombosis, not elsewhere classified: Secondary | ICD-10-CM | POA: Diagnosis not present

## 2021-08-04 DIAGNOSIS — I255 Ischemic cardiomyopathy: Secondary | ICD-10-CM | POA: Diagnosis not present

## 2021-08-04 DIAGNOSIS — I2111 ST elevation (STEMI) myocardial infarction involving right coronary artery: Secondary | ICD-10-CM | POA: Diagnosis not present

## 2021-08-04 LAB — BASIC METABOLIC PANEL
Anion gap: 8 (ref 5–15)
BUN: 20 mg/dL (ref 8–23)
CO2: 30 mmol/L (ref 22–32)
Calcium: 9.5 mg/dL (ref 8.9–10.3)
Chloride: 101 mmol/L (ref 98–111)
Creatinine, Ser: 1.06 mg/dL — ABNORMAL HIGH (ref 0.44–1.00)
GFR, Estimated: 54 mL/min — ABNORMAL LOW (ref >60)
Glucose, Bld: 107 mg/dL — ABNORMAL HIGH (ref 70–99)
Potassium: 4.2 mmol/L (ref 3.5–5.1)
Sodium: 139 mmol/L (ref 135–145)

## 2021-08-04 LAB — CBC
HCT: 42.4 % (ref 36.0–46.0)
Hemoglobin: 14.3 g/dL (ref 12.0–15.0)
MCH: 30.7 pg (ref 26.0–34.0)
MCHC: 33.7 g/dL (ref 30.0–36.0)
MCV: 91 fL (ref 80.0–100.0)
Platelets: 163 10*3/uL (ref 150–400)
RBC: 4.66 MIL/uL (ref 3.87–5.11)
RDW: 14 % (ref 11.5–15.5)
WBC: 8.4 10*3/uL (ref 4.0–10.5)
nRBC: 0 % (ref 0.0–0.2)

## 2021-08-04 LAB — HEPARIN LEVEL (UNFRACTIONATED): Heparin Unfractionated: 0.64 IU/mL (ref 0.30–0.70)

## 2021-08-04 LAB — MAGNESIUM: Magnesium: 2.3 mg/dL (ref 1.7–2.4)

## 2021-08-04 MED ORDER — SACUBITRIL-VALSARTAN 24-26 MG PO TABS
1.0000 | ORAL_TABLET | Freq: Two times a day (BID) | ORAL | Status: DC
  Administered 2021-08-04 – 2021-08-07 (×5): 1 via ORAL
  Filled 2021-08-04 (×7): qty 1

## 2021-08-04 MED ORDER — SPIRONOLACTONE 12.5 MG HALF TABLET
12.5000 mg | ORAL_TABLET | Freq: Every day | ORAL | Status: DC
  Administered 2021-08-05 – 2021-08-07 (×3): 12.5 mg via ORAL
  Filled 2021-08-04 (×4): qty 1

## 2021-08-04 MED ORDER — GABAPENTIN 400 MG PO CAPS
800.0000 mg | ORAL_CAPSULE | Freq: Three times a day (TID) | ORAL | Status: DC
  Administered 2021-08-04 – 2021-08-09 (×16): 800 mg via ORAL
  Filled 2021-08-04 (×16): qty 2

## 2021-08-04 MED ORDER — GABAPENTIN 400 MG PO CAPS
800.0000 mg | ORAL_CAPSULE | Freq: Three times a day (TID) | ORAL | Status: DC
Start: 2021-08-04 — End: 2021-08-04

## 2021-08-04 MED ORDER — DAPAGLIFLOZIN PROPANEDIOL 10 MG PO TABS
10.0000 mg | ORAL_TABLET | Freq: Every day | ORAL | Status: DC
  Administered 2021-08-04 – 2021-08-09 (×6): 10 mg via ORAL
  Filled 2021-08-04 (×6): qty 1

## 2021-08-04 MED ORDER — GADOBUTROL 1 MMOL/ML IV SOLN
10.0000 mL | Freq: Once | INTRAVENOUS | Status: AC | PRN
Start: 2021-08-04 — End: 2021-08-04
  Administered 2021-08-04: 10 mL via INTRAVENOUS

## 2021-08-04 NOTE — TOC Benefit Eligibility Note (Signed)
Patient Advocate Encounter  Insurance verification completed.    The patient is currently admitted and upon discharge could be taking Entresto 24-26 mg.  The current 30 day co-pay is, $47.00.   The patient is currently admitted and upon discharge could be taking Farxiga 10 mg.  The current 30 day co-pay is, $47.00.   The patient is currently admitted and upon discharge could be taking Jardiance 10 mg.  The current 30 day co-pay is, $47.00.   The patient is insured through [employer] Medicare Part D     Virgilene Stryker, CPhT Pharmacy Patient Advocate Specialist [facility] Patient Advocate Team Direct Number: [phone]  Fax: [phone]        

## 2021-08-04 NOTE — Progress Notes (Signed)
ANTICOAGULATION CONSULT NOTE  Pharmacy Consult for heparin Indication: chest pain/ACS  Allergies  Allergen Reactions   Morphine Rash    Patient Measurements: Height: '5\' 3"'$  (160 cm) Weight: 68 kg (150 lb) IBW/kg (Calculated) : 52.4 Heparin Dosing Weight: 68kg  Vital Signs: Temp: 98.2 F (36.8 C) (06/05 0400) Temp Source: Oral (06/05 0400) BP: 135/92 (06/05 0700) Pulse Rate: 69 (06/05 0700)  Labs: Recent Labs    08/02/21 1906 08/02/21 2135 08/03/21 0033 08/03/21 0947 08/03/21 1931 08/04/21 0443  HGB 13.4  --  14.1  --   --  14.3  HCT 40.6  --  41.9  --   --  42.4  PLT 151  --  172  --   --  163  APTT 26  --   --   --   --   --   LABPROT 12.2  --   --   --   --   --   INR 0.9  --   --   --   --   --   HEPARINUNFRC  --   --   --  0.20* 0.48 0.64  CREATININE 0.92  --  0.92  --   --  1.06*  TROPONINIHS 53* 78* 139*  --   --   --      Estimated Creatinine Clearance: 41.1 mL/min (A) (by C-G formula based on SCr of 1.06 mg/dL (H)).   Medical History: Past Medical History:  Diagnosis Date   Arthritis    Hx of non-Hodgkin's lymphoma    Hypertension      Assessment: 89 yoF admitted as code STEMI. Pt s/p LHC with mvCAD, planning CABG workup. Pharmacy to dose IV heparin, no AC prior to admission.  Repeat heparin level is therapeutic at 0.64 H/H stable, plt 163  Goal of Therapy:  Heparin level 0.3-0.7 units/ml Monitor platelets by anticoagulation protocol: Yes   Plan:  -Decrease heparin to 950 units/hr -Daily heparin level and CBC  Thank you for allowing pharmacy to be a part of this patient's care.  Donnald Garre, PharmD Clinical Pharmacist  Please check AMION for all [place] numbers After 10:00 PM, call [place] [phone]

## 2021-08-04 NOTE — Care Management (Signed)
  Transition of Care [facility]) Screening Note   Patient Details  Name: [name] Date of Birth: 07-13-1945   [facility]) CM/SW Contact:    Bethena Roys, RN Phone Number: 08/04/2021, 4:52 PM    Transition of Care Department [facility]) has reviewed the patient and no TOC needs have been identified at this time. We will continue to monitor patient advancement through interdisciplinary progression rounds. If new patient transition needs arise, please place a TOC consult.

## 2021-08-04 NOTE — Progress Notes (Signed)
Patient refusing BIPAP at this time. BIPAP is at bedside on standby if needed. RT instructed patient to have RT called if she decides to wear it. RT will monitor as needed.

## 2021-08-04 NOTE — Progress Notes (Signed)
We discussed findings and potential treatment options for her ischemic cardiomyopathy with echo EF 30%.  Cardiac MRI viability study demonstrates EF 33% with global hypokinesis but more severe in the mid anterior and apex.  Mild apical thrombus is noted.  There is no late gadolinium enhancement (LGE), and therefore viability exists in all 3 major territories. We discussed PCI LAD +/- circumflex.  We discussed the concept of incomplete revascularization.  We discussed the risk of sidebranch occlusion with a very long stent in the LAD due to sidebranch occlusion and the inability to perform surgery on the LAD after such a procedure.  We discussed the relatively benign recovery from PCI versus surgery.  We discussed the possibility of incomplete recovery of LV function with either surgery or coronary bypass surgery. We discussed CABG.  She has a negative feeling about surgery.   We discussed medical therapy.  We discussed the 12 to 20% yearly mortality with three-vessel disease and unstable angina on medical therapy.  She is unable to make any sense of any of this conversation.  Wants her son to be here for further discussion.  I will speak with them again tomorrow morning.

## 2021-08-05 DIAGNOSIS — E785 Hyperlipidemia, unspecified: Secondary | ICD-10-CM | POA: Diagnosis not present

## 2021-08-05 DIAGNOSIS — I2111 ST elevation (STEMI) myocardial infarction involving right coronary artery: Secondary | ICD-10-CM | POA: Diagnosis not present

## 2021-08-05 DIAGNOSIS — I513 Intracardiac thrombosis, not elsewhere classified: Secondary | ICD-10-CM | POA: Diagnosis not present

## 2021-08-05 DIAGNOSIS — I5021 Acute systolic (congestive) heart failure: Secondary | ICD-10-CM | POA: Diagnosis not present

## 2021-08-05 LAB — BASIC METABOLIC PANEL
Anion gap: 9 (ref 5–15)
BUN: 17 mg/dL (ref 8–23)
CO2: 26 mmol/L (ref 22–32)
Calcium: 9.6 mg/dL (ref 8.9–10.3)
Chloride: 104 mmol/L (ref 98–111)
Creatinine, Ser: 0.89 mg/dL (ref 0.44–1.00)
GFR, Estimated: >60 mL/min (ref >60)
Glucose, Bld: 103 mg/dL — ABNORMAL HIGH (ref 70–99)
Potassium: 3.8 mmol/L (ref 3.5–5.1)
Sodium: 139 mmol/L (ref 135–145)

## 2021-08-05 LAB — CBC
HCT: 45.6 % (ref 36.0–46.0)
Hemoglobin: 15.2 g/dL — ABNORMAL HIGH (ref 12.0–15.0)
MCH: 30 pg (ref 26.0–34.0)
MCHC: 33.3 g/dL (ref 30.0–36.0)
MCV: 89.9 fL (ref 80.0–100.0)
Platelets: 194 10*3/uL (ref 150–400)
RBC: 5.07 MIL/uL (ref 3.87–5.11)
RDW: 14.1 % (ref 11.5–15.5)
WBC: 8.1 10*3/uL (ref 4.0–10.5)
nRBC: 0 % (ref 0.0–0.2)

## 2021-08-05 LAB — HEPARIN LEVEL (UNFRACTIONATED): Heparin Unfractionated: 0.44 IU/mL (ref 0.30–0.70)

## 2021-08-05 LAB — BRAIN NATRIURETIC PEPTIDE: B Natriuretic Peptide: 513.5 pg/mL — ABNORMAL HIGH (ref 0.0–100.0)

## 2021-08-05 LAB — LIPOPROTEIN A (LPA): Lipoprotein (a): 105.8 nmol/L — ABNORMAL HIGH (ref <75.0)

## 2021-08-05 NOTE — Progress Notes (Addendum)
Progress Note  Patient Name: [name] Date of Encounter: 08/05/2021  [facility] Cardiologist: Gerald Stabs End  Subjective   Unable/unwilling to make decision about therapy until son is available for conversation.  He cannot be here until after 330 today because he has to work this shift.  No chest discomfort or issues overnight.  Tells me that she is leaning towards PCI.  Inpatient Medications    Scheduled Meds:  aspirin EC  81 mg Oral Daily   atorvastatin  80 mg Oral QHS   carvedilol  3.125 mg Oral BID WC   Chlorhexidine Gluconate Cloth  6 each Topical Daily   dapagliflozin propanediol  10 mg Oral Daily   gabapentin  800 mg Oral TID   polyethylene glycol  17 g Oral BID   sacubitril-valsartan  1 tablet Oral BID   sodium chloride flush  3 mL Intravenous Q12H   spironolactone  12.5 mg Oral Daily   Continuous Infusions:  sodium chloride Stopped (08/03/21 0151)   sodium chloride     heparin 950 Units/hr (08/05/21 0413)   PRN Meds: sodium chloride, acetaminophen, ALPRAZolam, bisacodyl, magnesium hydroxide, melatonin, nitroGLYCERIN, ondansetron (ZOFRAN) IV, oxyCODONE, sodium chloride flush   Vital Signs    Vitals:   08/05/21 0600 08/05/21 0700 08/05/21 0727 08/05/21 0800  BP: (!) 107/53 (!) 97/57  (!) 97/57  Pulse: 71 77  88  Resp: 16 17    Temp:   (!) 97.4 F (36.3 C)   TempSrc:   Axillary   SpO2: 94% 95%    Weight:      Height:        Intake/Output Summary (Last 24 hours) at 08/05/2021 0803 Last data filed at 08/05/2021 0156 Gross per 24 hour  Intake 703.04 ml  Output 2375 ml  Net -1671.96 ml      08/02/2021    6:38 PM 07/21/2021    4:23 PM  Last 3 Weights  Weight (lbs) 150 lb 147 lb  Weight (kg) 68.04 kg 66.679 kg      Telemetry    Sinus rhythm without significant ectopy.- Personally Reviewed  ECG    Inferior and anterior mild ST elevation with ischemic T wave abnormality.  Inferior Q waves are noted..- Personally Reviewed  Physical Exam   Laying flat in bed.  Also sat up in chair without significant issues.  Relatively low blood pressures. GEN: No acute distress.   Neck: No JVD Cardiac: RRR, no murmurs, rubs, or gallops.  Respiratory: Clear to auscultation bilaterally. GI: Soft, nontender, non-distended  MS: No edema; No deformity. Neuro:  Nonfocal  Psych: Normal affect   Labs    High Sensitivity Troponin:   Recent Labs  Lab 08/02/21 1906 08/02/21 2135 08/03/21 0033  TROPONINIHS 53* 78* 139*     Chemistry Recent Labs  Lab 08/02/21 1906 08/02/21 2135 08/03/21 0033 08/04/21 0443 08/05/21 0148  NA 140  --  142 139 139  K 3.7  --  3.6 4.2 3.8  CL 107  --  103 101 104  CO2 29  --  '31 30 26  '$ GLUCOSE 143*  --  123* 107* 103*  BUN 20  --  '17 20 17  '$ CREATININE 0.92  --  0.92 1.06* 0.89  CALCIUM 8.8*  --  8.8* 9.5 9.6  MG  --  2.2 2.0 2.3  --   PROT 6.3*  --  5.9*  --   --   ALBUMIN 3.7  --  3.4*  --   --  AST 18  --  22  --   --   ALT 12  --  14  --   --   ALKPHOS 47  --  49  --   --   BILITOT 0.6  --  0.4  --   --   GFRNONAA >60  --  >60 54* >60  ANIONGAP 4*  --  '8 8 9    '$ Lipids  Recent Labs  Lab 08/02/21 1906  CHOL 153  TRIG 124  HDL 48  LDLCALC 80  CHOLHDL 3.2    Hematology Recent Labs  Lab 08/03/21 0033 08/04/21 0443 08/05/21 0148  WBC 9.1 8.4 8.1  RBC 4.59 4.66 5.07  HGB 14.1 14.3 15.2*  HCT 41.9 42.4 45.6  MCV 91.3 91.0 89.9  MCH 30.7 30.7 30.0  MCHC 33.7 33.7 33.3  RDW 14.0 14.0 14.1  PLT 172 163 194   Thyroid No results for input(s): TSH, FREET4 in the last 168 hours.  BNPNo results for input(s): BNP, PROBNP in the last 168 hours.  DDimer No results for input(s): DDIMER in the last 168 hours.   Radiology    MR CARDIAC MORPHOLOGY W WO CONTRAST  Result Date: 08/04/2021 CLINICAL DATA:  Clinical question of cardiac viability Study assumes HCT of 42 and BSA of 1.74 EXAM: CARDIAC MRI TECHNIQUE: The patient was scanned on a 1.5 Tesla GE magnet. A dedicated cardiac coil was  used. Functional imaging was done using Fiesta sequences. 2,3, and 4 chamber views were done to assess for RWMA's. Modified Simpson's rule using a short axis stack was used to calculate an ejection fraction on a dedicated work Conservation officer, nature. The patient received 10 cc of Gadavist. After 10 minutes inversion recovery sequences were used to assess for infiltration and scar tissue. CONTRAST:  10 cc  of Gadavist FINDINGS: 1. Normal left ventricular size, with LVEDD 39 mm, and LVEDVi 60 mL/m2. Moderate asymmetric septal hypertrophy, with intraventricular septal thickness of 14 mm, posterior wall thickness of 8 mm, and myocardial mass index mildly increased at 66 g/m2. Normal left ventricular systolic function (LVEF =18%). Global hypokinesis with hypokinesis in the basal, anteroseptal and severe hypokinesis mid and apex. Notably, mid inferior, inferoseptal and apical inferior are akinetic. There is a small LV apical thrombus. There is no late gadolinium enhancement in the left ventricular myocardium. 2. Normal right ventricular size with RVEDVI 29 mL/m2. Normal right ventricular thickness. Normal right ventricular systolic function (RVEF =84%). There are no regional wall motion abnormalities or aneurysms. 3.  Normal left and right atrial size. 4.  Normal size of the pulmonary artery. Ascending aorta is at the upper limit of normal for age and BSA, 39 mm. 5. Valve assessment: Aortic Valve: Not well visualized. Qualitatively, there is no significant regurgitation. Pulmonic Valve: Qualitatively, there is no significant regurgitation. Tricuspid Valve: Qualitatively, there is no significant regurgitation. Mitral Valve: Abnormally thickened with posterior leaflet restriction. There is no significant regurgitation. 6. Normal pericardium. No pericardial effusion. Prominent pericardial fat. 7. Grossly, no additional extracardiac findings, outside of bilateral pleural effusions. Recommended dedicated study if  concerned for non-cardiac pathology. IMPRESSION: No evidence of late gadolinium enhancement-suggestive of viable myocardium. Small LV thrombus. Rudean Haskell MD Electronically Signed   By: Rudean Haskell M.D.   On: 08/04/2021 13:50   ECHOCARDIOGRAM COMPLETE  Result Date: 08/03/2021    ECHOCARDIOGRAM REPORT   Patient Name:   [name] Date of Exam: 08/03/2021 Medical Rec #:  [id number]  Height:       63.0 in Accession #:    [id number]      Weight:       150.0 lb Date of Birth:  May 03, 1945        BSA:          64.711 m Patient Age:    17 years        BP:           120/66 mmHg Patient Gender: F               HR:           70 bpm. Exam Location:  Inpatient Procedure: 2D Echo, 3D Echo, Cardiac Doppler, Color Doppler and Intracardiac            Opacification Agent Indications:    122-I22.9 Subsequent ST elevation (STEM) and non-ST elevation                 (NSTEMI) myocardial infarction  History:        Patient has no prior history of Echocardiogram examinations.                 Acute MI and CAD, Signs/Symptoms:Chest Pain and Altered Mental                 Status; Risk Factors:Current Smoker and Dyslipidemia.  Sonographer:    Roseanna Rainbow RDCS Referring Phys: [place]  1. Left ventricular ejection fraction, by estimation, is 30 to 35%. The left ventricle has moderately decreased function. The left ventricle demonstrates regional wall motion abnormalities (see scoring diagram/findings for description). There is moderate asymmetric left ventricular hypertrophy of the basal-septal segment. Left ventricular diastolic parameters are consistent with Grade I diastolic dysfunction (impaired relaxation).  2. LV apical mural thrombus  3. Right ventricular systolic function is normal. The right ventricular size is normal. Tricuspid regurgitation signal is inadequate for assessing PA pressure.  4. The mitral valve is degenerative. Trivial mitral valve regurgitation. No evidence of mitral  stenosis. Moderate mitral annular calcification.  5. The aortic valve was not well visualized. Aortic valve regurgitation is trivial. No aortic stenosis is present.  6. The inferior vena cava is normal in size with greater than 50% respiratory variability, suggesting right atrial pressure of 3 mmHg. FINDINGS  Left Ventricle: Left ventricular ejection fraction, by estimation, is 30 to 35%. The left ventricle has moderately decreased function. The left ventricle demonstrates regional wall motion abnormalities. Definity contrast agent was given IV to delineate the left ventricular endocardial borders. The left ventricular internal cavity size was normal in size. There is moderate asymmetric left ventricular hypertrophy of the basal-septal segment. Left ventricular diastolic parameters are consistent with Grade  I diastolic dysfunction (impaired relaxation).  LV Wall Scoring: The mid and distal anterior septum, entire apex, and mid inferoseptal segment are akinetic. The mid inferolateral segment, mid anterolateral segment, mid anterior segment, and mid inferior segment are hypokinetic. The basal anteroseptal segment, basal inferolateral segment, basal anterolateral segment, basal anterior segment, basal inferior segment, and basal inferoseptal segment are normal. Right Ventricle: The right ventricular size is normal. No increase in right ventricular wall thickness. Right ventricular systolic function is normal. Tricuspid regurgitation signal is inadequate for assessing PA pressure. Left Atrium: Left atrial size was normal in size. Right Atrium: Right atrial size was normal in size. Pericardium: There is no evidence of pericardial effusion. Mitral Valve: The mitral valve is degenerative in appearance. Moderate mitral annular calcification. Trivial mitral valve  regurgitation. No evidence of mitral valve stenosis. Tricuspid Valve: The tricuspid valve is normal in structure. Tricuspid valve regurgitation is trivial. Aortic  Valve: The aortic valve was not well visualized. Aortic valve regurgitation is trivial. No aortic stenosis is present. Pulmonic Valve: The pulmonic valve was not well visualized. Pulmonic valve regurgitation is not visualized. Aorta: The aortic root and ascending aorta are structurally normal, with no evidence of dilitation. Venous: The inferior vena cava is normal in size with greater than 50% respiratory variability, suggesting right atrial pressure of 3 mmHg. IAS/Shunts: The interatrial septum was not well visualized.  LEFT VENTRICLE PLAX 2D LVIDd:         5.00 cm     Diastology LVIDs:         4.10 cm     LV e' medial:    3.23 cm/s LV PW:         1.10 cm     LV E/e' medial:  18.0 LV IVS:        0.80 cm     LV e' lateral:   6.23 cm/s LVOT diam:     1.80 cm     LV E/e' lateral: 9.3 LV SV:         49 LV SV Index:   28 LVOT Area:     2.54 cm                             3D Volume EF: LV Volumes (MOD)           3D EF:        34 % LV vol d, MOD A2C: 81.9 ml LV EDV:       92 ml LV vol d, MOD A4C: 90.8 ml LV ESV:       61 ml LV vol s, MOD A2C: 67.6 ml LV SV:        31 ml LV vol s, MOD A4C: 65.2 ml LV SV MOD A2C:     14.3 ml LV SV MOD A4C:     90.8 ml LV SV MOD BP:      18.6 ml RIGHT VENTRICLE             IVC RV S prime:     10.60 cm/s  IVC diam: 1.50 cm TAPSE (M-mode): 1.7 cm LEFT ATRIUM             Index        RIGHT ATRIUM          Index LA Vol (A2C):   31.2 ml 18.23 ml/m  RA Area:     9.36 cm LA Vol (A4C):   39.1 ml 22.85 ml/m  RA Volume:   18.90 ml 11.05 ml/m LA Biplane Vol: 35.5 ml 20.75 ml/m  AORTIC VALVE LVOT Vmax:   96.40 cm/s LVOT Vmean:  61.300 cm/s LVOT VTI:    0.191 m  AORTA Ao Root diam: 3.10 cm Ao Asc diam:  3.50 cm MITRAL VALVE MV Area (PHT): 3.53 cm    SHUNTS MV Decel Time: 215 msec    Systemic VTI:  0.19 m MV E velocity: 58.20 cm/s  Systemic Diam: 1.80 cm MV A velocity: 96.40 cm/s MV E/A ratio:  0.60 Oswaldo Milian MD Electronically signed by Oswaldo Milian MD Signature Date/Time:  08/03/2021/1:23:48 PM    Final     Cardiac Studies   ECHOCARDIOGRAM 08/02/2021: IMPRESSIONS     1. Left ventricular ejection fraction,  by estimation, is 30 to 35%. The  left ventricle has moderately decreased function. The left ventricle  demonstrates regional wall motion abnormalities (see scoring  diagram/findings for description). There is  moderate asymmetric left ventricular hypertrophy of the basal-septal  segment. Left ventricular diastolic parameters are consistent with Grade I  diastolic dysfunction (impaired relaxation).   2. LV apical mural thrombus   3. Right ventricular systolic function is normal. The right ventricular  size is normal. Tricuspid regurgitation signal is inadequate for assessing  PA pressure.   4. The mitral valve is degenerative. Trivial mitral valve regurgitation.  No evidence of mitral stenosis. Moderate mitral annular calcification.   5. The aortic valve was not well visualized. Aortic valve regurgitation  is trivial. No aortic stenosis is present.   6. The inferior vena cava is normal in size with greater than 50%  respiratory variability, suggesting right atrial pressure of 3 mmHg.   Cardiac Cath 3, 2023: Diagnostic Dominance: Right    Elevated LVEDP 30-35 mmHg  Patient Profile     76 y.o. female HTN, HLD, and tobacco use, who presented to [facility] with 3 days of SOB and CP and ECG c/w possible anterolateral STEMI which at cath demonstrated severe tandem LAD plus severe RCA and CFX disease. Presented with CHF/pulmonary edema. TCTS consulted and favors PCI.  LV mural thrombus.  There is global viability on MRI.  Assessment & Plan    Anterior MI, late presentation: CAD, native: discussed PCI with colleagues. All agree LAD is treatable with PCI but increased risk and may require support. No LGE in anterior wall. Will speak with son this afternoon. Will initiate DAPT if surgery definitely excluded by patient after todays shared decision making process  with her son. Bilateral carotid disease: Noted Acute systolic HF: The MRI is somewhat confusing. No LGE noted anywhere. Inferior wall is akinetic; antero apical is severely hypokinetic. Approachable territory for PCI is LAD with or without support. No requiring inotropics. Will optimize heart failure therapy. Possible LAD PCI later this week. LV apical thrombus: Anticoagulation until management strategy determined.  I don't believe she will agree to surgery. Need to determine if PCI is reasonable. With global viability, surgery would be best option. PCI will fall very short of complete revasc and leave her at high risk of poor outcome.  For questions or updates, please contact [place] Please consult [email or web address] for contact info under        Signed, Sinclair Grooms, MD  08/05/2021, 8:03 AM

## 2021-08-05 NOTE — Progress Notes (Signed)
[place] for heparin Indication: chest pain/ACS  Allergies  Allergen Reactions   Morphine Rash    Patient Measurements: Height: '5\' 3"'$  (160 cm) Weight:  (patient agitated and refusing weight; told RN to "quit waking me up and leave me alone") IBW/kg (Calculated) : 52.4 Heparin Dosing Weight: 68kg  Vital Signs: Temp: 97.7 F (36.5 C) (06/05 2300) Temp Source: Axillary (06/05 2300) BP: 97/57 (06/06 0700) Pulse Rate: 77 (06/06 0700)  Labs: Recent Labs    08/02/21 1906 08/02/21 2135 08/03/21 0033 08/03/21 0947 08/03/21 1931 08/04/21 0443 08/05/21 0148  HGB 13.4  --  14.1  --   --  14.3 15.2*  HCT 40.6  --  41.9  --   --  42.4 45.6  PLT 151  --  172  --   --  163 194  APTT 26  --   --   --   --   --   --   LABPROT 12.2  --   --   --   --   --   --   INR 0.9  --   --   --   --   --   --   HEPARINUNFRC  --   --   --    < > 0.48 0.64 0.44  CREATININE 0.92  --  0.92  --   --  1.06* 0.89  TROPONINIHS 53* 78* 139*  --   --   --   --    < > = values in this interval not displayed.     Estimated Creatinine Clearance: 49 mL/min (by C-G formula based on SCr of 0.89 mg/dL).   Medical History: Past Medical History:  Diagnosis Date   Arthritis    Hx of non-Hodgkin's lymphoma    Hypertension      Assessment: 61 yoF admitted as code STEMI. Pt s/p LHC with mvCAD, planning CABG workup. Pharmacy to dose IV heparin, no AC prior to admission.  Repeat heparin level is therapeutic at 0.44 H/H stable, plt 194  Goal of Therapy:  Heparin level 0.3-0.7 units/ml Monitor platelets by anticoagulation protocol: Yes   Plan:  -Continue heparin 950 units/hr -Daily heparin level and CBC  Thank you for allowing pharmacy to be a part of this patient's care.  Donnald Garre, PharmD Clinical Pharmacist  Please check AMION for all [place] numbers After 10:00 PM, call [place] [phone]

## 2021-08-05 NOTE — Plan of Care (Signed)
  Problem: Education: Goal: Understanding of CV disease, CV risk reduction, and recovery process will improve Outcome: Progressing Goal: Individualized Educational Video(s) Outcome: Progressing   Problem: Activity: Goal: Ability to return to baseline activity level will improve Outcome: Progressing   Problem: Cardiovascular: Goal: Ability to achieve and maintain adequate cardiovascular perfusion will improve Outcome: Progressing Goal: Vascular access site(s) Level 0-1 will be maintained Outcome: Progressing   Problem: Health Behavior/Discharge Planning: Goal: Ability to safely manage health-related needs after discharge will improve Outcome: Progressing   Problem: Education: Goal: Understanding of cardiac disease, CV risk reduction, and recovery process will improve Outcome: Progressing Goal: Understanding of medication regimen will improve Outcome: Progressing Goal: Individualized Educational Video(s) Outcome: Progressing   Problem: Activity: Goal: Ability to tolerate increased activity will improve Outcome: Progressing   Problem: Cardiac: Goal: Ability to achieve and maintain adequate cardiopulmonary perfusion will improve Outcome: Progressing Goal: Vascular access site(s) Level 0-1 will be maintained Outcome: Progressing   Problem: Health Behavior/Discharge Planning: Goal: Ability to safely manage health-related needs after discharge will improve Outcome: Progressing   Problem: Education: Goal: Knowledge of General Education information will improve Description: Including pain rating scale, medication(s)/side effects and non-pharmacologic comfort measures Outcome: Progressing   Problem: Health Behavior/Discharge Planning: Goal: Ability to manage health-related needs will improve Outcome: Progressing   Problem: Clinical Measurements: Goal: Ability to maintain clinical measurements within normal limits will improve Outcome: Progressing Goal: Will remain free  from infection Outcome: Progressing Goal: Diagnostic test results will improve Outcome: Progressing Goal: Respiratory complications will improve Outcome: Progressing Goal: Cardiovascular complication will be avoided Outcome: Progressing   Problem: Activity: Goal: Risk for activity intolerance will decrease Outcome: Progressing   Problem: Nutrition: Goal: Adequate nutrition will be maintained Outcome: Progressing   Problem: Coping: Goal: Level of anxiety will decrease Outcome: Progressing   Problem: Elimination: Goal: Will not experience complications related to bowel motility Outcome: Progressing Goal: Will not experience complications related to urinary retention Outcome: Progressing   Problem: Pain Managment: Goal: General experience of comfort will improve Outcome: Progressing   Problem: Safety: Goal: Ability to remain free from injury will improve Outcome: Progressing   Problem: Skin Integrity: Goal: Risk for impaired skin integrity will decrease Outcome: Progressing

## 2021-08-06 DIAGNOSIS — I5021 Acute systolic (congestive) heart failure: Secondary | ICD-10-CM | POA: Diagnosis not present

## 2021-08-06 DIAGNOSIS — E785 Hyperlipidemia, unspecified: Secondary | ICD-10-CM | POA: Diagnosis not present

## 2021-08-06 DIAGNOSIS — I513 Intracardiac thrombosis, not elsewhere classified: Secondary | ICD-10-CM | POA: Diagnosis not present

## 2021-08-06 DIAGNOSIS — I2111 ST elevation (STEMI) myocardial infarction involving right coronary artery: Secondary | ICD-10-CM | POA: Diagnosis not present

## 2021-08-06 LAB — CBC
HCT: 42.9 % (ref 36.0–46.0)
Hemoglobin: 14.1 g/dL (ref 12.0–15.0)
MCH: 30.3 pg (ref 26.0–34.0)
MCHC: 32.9 g/dL (ref 30.0–36.0)
MCV: 92.1 fL (ref 80.0–100.0)
Platelets: 179 10*3/uL (ref 150–400)
RBC: 4.66 MIL/uL (ref 3.87–5.11)
RDW: 14.1 % (ref 11.5–15.5)
WBC: 7 10*3/uL (ref 4.0–10.5)
nRBC: 0 % (ref 0.0–0.2)

## 2021-08-06 LAB — BASIC METABOLIC PANEL
Anion gap: 8 (ref 5–15)
BUN: 18 mg/dL (ref 8–23)
CO2: 27 mmol/L (ref 22–32)
Calcium: 9.2 mg/dL (ref 8.9–10.3)
Chloride: 102 mmol/L (ref 98–111)
Creatinine, Ser: 0.9 mg/dL (ref 0.44–1.00)
GFR, Estimated: >60 mL/min (ref >60)
Glucose, Bld: 115 mg/dL — ABNORMAL HIGH (ref 70–99)
Potassium: 4 mmol/L (ref 3.5–5.1)
Sodium: 137 mmol/L (ref 135–145)

## 2021-08-06 LAB — HEPARIN LEVEL (UNFRACTIONATED): Heparin Unfractionated: 0.52 IU/mL (ref 0.30–0.70)

## 2021-08-06 MED ORDER — CLOPIDOGREL BISULFATE 300 MG PO TABS
300.0000 mg | ORAL_TABLET | Freq: Once | ORAL | Status: AC
  Administered 2021-08-06: 300 mg via ORAL
  Filled 2021-08-06: qty 1

## 2021-08-06 MED ORDER — CLOPIDOGREL BISULFATE 75 MG PO TABS
75.0000 mg | ORAL_TABLET | Freq: Every day | ORAL | Status: DC
  Administered 2021-08-07 – 2021-08-09 (×3): 75 mg via ORAL
  Filled 2021-08-06 (×4): qty 1

## 2021-08-06 MED ORDER — ENOXAPARIN SODIUM 60 MG/0.6ML IJ SOSY
60.0000 mg | PREFILLED_SYRINGE | Freq: Two times a day (BID) | INTRAMUSCULAR | Status: DC
  Administered 2021-08-06 – 2021-08-07 (×4): 60 mg via SUBCUTANEOUS
  Filled 2021-08-06 (×6): qty 0.6

## 2021-08-06 NOTE — Plan of Care (Signed)
  Problem: Education: Goal: Understanding of CV disease, CV risk reduction, and recovery process will improve Outcome: Progressing   Problem: Activity: Goal: Ability to return to baseline activity level will improve Outcome: Progressing   Problem: Cardiovascular: Goal: Ability to achieve and maintain adequate cardiovascular perfusion will improve Outcome: Progressing Goal: Vascular access site(s) Level 0-1 will be maintained Outcome: Progressing   Problem: Health Behavior/Discharge Planning: Goal: Ability to safely manage health-related needs after discharge will improve Outcome: Progressing   Problem: Coping: Goal: Level of anxiety will decrease Outcome: Progressing   Problem: Pain Managment: Goal: General experience of comfort will improve Outcome: Progressing   Problem: Safety: Goal: Ability to remain free from injury will improve Outcome: Progressing

## 2021-08-06 NOTE — Progress Notes (Signed)
[place] for lovenox Indication: chest pain/ACS  Allergies  Allergen Reactions   Morphine Rash    Patient Measurements: Height: '5\' 3"'$  (160 cm) Weight: 63 kg (138 lb 14.2 oz) IBW/kg (Calculated) : 52.4 Heparin Dosing Weight: 68kg  Vital Signs: Temp: 98.1 F (36.7 C) (06/07 0800) Temp Source: Oral (06/07 0800) BP: 116/81 (06/07 0800) Pulse Rate: 60 (06/07 0800)  Labs: Recent Labs    08/04/21 0443 08/05/21 0148 08/06/21 0219  HGB 14.3 15.2* 14.1  HCT 42.4 45.6 42.9  PLT 163 194 179  HEPARINUNFRC 0.64 0.44 0.52  CREATININE 1.06* 0.89 0.90     Estimated Creatinine Clearance: 46.8 mL/min (by C-G formula based on SCr of 0.9 mg/dL).   Medical History: Past Medical History:  Diagnosis Date   Arthritis    Hx of non-Hodgkin's lymphoma    Hypertension      Assessment: 26 yoF admitted as code STEMI. Pt s/p LHC with mvCAD and now noted with LV mural thrombus. Pharmacy dosing IV heparin and to change to lovenox, no AC prior to admission.  -hg= 14.1, SCr= 0.9  Goal of Therapy:  Monitor platelets by anticoagulation protocol: Yes   Plan:  -Lovenox '60mg'$  sq q12h -CBC every 3 days  Hildred Laser, PharmD Clinical Pharmacist **Pharmacist phone directory can now be found on [email or web address] ([email or web address]).  Listed under [place].

## 2021-08-06 NOTE — Progress Notes (Signed)
CSW received consult for patient. CSW met with patient at bedside. Patient reports she comes from home with son and daughter-n-law. CSW offered patient psychiatry and counseling resources. Patient accepted. All questions answered. Patient reports her son will provide transportation when she is medically ready for dc. No further questions reported at this time.

## 2021-08-06 NOTE — Progress Notes (Signed)
Progress Note  Patient Name: [name] Date of Encounter: 08/06/2021  [facility] Cardiologist: Minta Balsam, MD  Subjective   Long discussion yesterday with the patient and son. She has decided against surgery. She prefers medical therapy but will undergo stent procedure if meds fail. We have started plavix and plan to optimize medical therapy.  Had a significant episode of dyspnea in the middle of the night when she awakened to urinate.  No issues currently.  I explained to her the dyspnea may be an anginal equivalent.  Inpatient Medications    Scheduled Meds:  aspirin EC  81 mg Oral Daily   atorvastatin  80 mg Oral QHS   carvedilol  3.125 mg Oral BID WC   Chlorhexidine Gluconate Cloth  6 each Topical Daily   dapagliflozin propanediol  10 mg Oral Daily   gabapentin  800 mg Oral TID   polyethylene glycol  17 g Oral BID   sacubitril-valsartan  1 tablet Oral BID   sodium chloride flush  3 mL Intravenous Q12H   spironolactone  12.5 mg Oral Daily   Continuous Infusions:  sodium chloride Stopped (08/03/21 0151)   sodium chloride     heparin 950 Units/hr (08/06/21 0703)   PRN Meds: sodium chloride, acetaminophen, ALPRAZolam, bisacodyl, magnesium hydroxide, melatonin, nitroGLYCERIN, ondansetron (ZOFRAN) IV, oxyCODONE, sodium chloride flush   Vital Signs    Vitals:   08/06/21 0410 08/06/21 0500 08/06/21 0600 08/06/21 0700  BP: (!) 102/56 104/60 (!) 102/55 (!) 108/58  Pulse: (!) 59 64 86 (!) 59  Resp: '12 13 18 19  '$ Temp:      TempSrc:      SpO2: 97% 96% 100% 96%  Weight:      Height:        Intake/Output Summary (Last 24 hours) at 08/06/2021 0736 Last data filed at 08/06/2021 0703 Gross per 24 hour  Intake 1772.49 ml  Output 800 ml  Net 972.49 ml      08/06/2021    4:00 AM 08/02/2021    6:38 PM  Last 3 Weights  Weight (lbs) 138 lb 14.2 oz 150 lb  Weight (kg) 63 kg 68.04 kg      Telemetry    Normal sinus rhythm- Personally Reviewed  ECG    08/05/2021 with  precordial poor R wave progression and Q waves V45 and 6.  Also inferior T wave abnormality as is present in anterior leads.  These are evolutionary changes compared to prior EKGs this admission.- Personally Reviewed  Physical Exam  Frail.  Did not sleep overnight. GEN: No acute distress.   Neck: No JVD Cardiac: RRR, no murmurs, rubs, or gallops.  Respiratory: Clear to auscultation bilaterally. GI: Soft, nontender, non-distended  MS: No edema; No deformity. Neuro:  Nonfocal  Psych: Normal affect   Labs    High Sensitivity Troponin:   Recent Labs  Lab 08/02/21 1906 08/02/21 2135 08/03/21 0033  TROPONINIHS 53* 78* 139*     Chemistry Recent Labs  Lab 08/02/21 1906 08/02/21 2135 08/03/21 0033 08/04/21 0443 08/05/21 0148 08/06/21 0219  NA 140  --  142 139 139 137  K 3.7  --  3.6 4.2 3.8 4.0  CL 107  --  103 101 104 102  CO2 29  --  '31 30 26 27  '$ GLUCOSE 143*  --  123* 107* 103* 115*  BUN 20  --  '17 20 17 18  '$ CREATININE 0.92  --  0.92 1.06* 0.89 0.90  CALCIUM 8.8*  --  8.8* 9.5 9.6 9.2  MG  --  2.2 2.0 2.3  --   --   PROT 6.3*  --  5.9*  --   --   --   ALBUMIN 3.7  --  3.4*  --   --   --   AST 18  --  22  --   --   --   ALT 12  --  14  --   --   --   ALKPHOS 47  --  49  --   --   --   BILITOT 0.6  --  0.4  --   --   --   GFRNONAA >60  --  >60 54* >60 >60  ANIONGAP 4*  --  '8 8 9 8    '$ Lipids  Recent Labs  Lab 08/02/21 1906  CHOL 153  TRIG 124  HDL 48  LDLCALC 80  CHOLHDL 3.2    Hematology Recent Labs  Lab 08/04/21 0443 08/05/21 0148 08/06/21 0219  WBC 8.4 8.1 7.0  RBC 4.66 5.07 4.66  HGB 14.3 15.2* 14.1  HCT 42.4 45.6 42.9  MCV 91.0 89.9 92.1  MCH 30.7 30.0 30.3  MCHC 33.7 33.3 32.9  RDW 14.0 14.1 14.1  PLT 163 194 179   Thyroid No results for input(s): TSH, FREET4 in the last 168 hours.  BNP Recent Labs  Lab 08/05/21 0148  BNP 513.5*    DDimer No results for input(s): DDIMER in the last 168 hours.   Radiology    MR CARDIAC MORPHOLOGY W WO  CONTRAST  Addendum Date: 08/05/2021   ADDENDUM REPORT: 08/05/2021 10:33 ADDENDUM: Reviewed CMR with Primary MD. Though there is some evidence of patchy LGE at the basal septum in the PSAX view, there is no transmural LGE in the mid or apex. LAD territory shows evidence of viability. Electronically Signed   By: Rudean Haskell M.D.   On: 08/05/2021 10:33   Result Date: 08/05/2021 CLINICAL DATA:  Clinical question of cardiac viability Study assumes HCT of 42 and BSA of 1.74 EXAM: CARDIAC MRI TECHNIQUE: The patient was scanned on a 1.5 Tesla GE magnet. A dedicated cardiac coil was used. Functional imaging was done using Fiesta sequences. 2,3, and 4 chamber views were done to assess for RWMA's. Modified Simpson's rule using a short axis stack was used to calculate an ejection fraction on a dedicated work Conservation officer, nature. The patient received 10 cc of Gadavist. After 10 minutes inversion recovery sequences were used to assess for infiltration and scar tissue. CONTRAST:  10 cc  of Gadavist FINDINGS: 1. Normal left ventricular size, with LVEDD 39 mm, and LVEDVi 60 mL/m2. Moderate asymmetric septal hypertrophy, with intraventricular septal thickness of 14 mm, posterior wall thickness of 8 mm, and myocardial mass index mildly increased at 66 g/m2. Normal left ventricular systolic function (LVEF =47%). Global hypokinesis with hypokinesis in the basal, anteroseptal and severe hypokinesis mid and apex. Notably, mid inferior, inferoseptal and apical inferior are akinetic. There is a small LV apical thrombus. There is no late gadolinium enhancement in the left ventricular myocardium. 2. Normal right ventricular size with RVEDVI 29 mL/m2. Normal right ventricular thickness. Normal right ventricular systolic function (RVEF =82%). There are no regional wall motion abnormalities or aneurysms. 3.  Normal left and right atrial size. 4.  Normal size of the pulmonary artery. Ascending aorta is at the upper limit of  normal for age and BSA, 39 mm. 5. Valve assessment: Aortic Valve:  Not well visualized. Qualitatively, there is no significant regurgitation. Pulmonic Valve: Qualitatively, there is no significant regurgitation. Tricuspid Valve: Qualitatively, there is no significant regurgitation. Mitral Valve: Abnormally thickened with posterior leaflet restriction. There is no significant regurgitation. 6. Normal pericardium. No pericardial effusion. Prominent pericardial fat. 7. Grossly, no additional extracardiac findings, outside of bilateral pleural effusions. Recommended dedicated study if concerned for non-cardiac pathology. IMPRESSION: No evidence of late gadolinium enhancement-suggestive of viable myocardium. Small LV thrombus. Rudean Haskell MD Electronically Signed: By: Rudean Haskell M.D. On: 08/04/2021 13:50    Cardiac Studies   08/02/2021 Coronary Angiography: Diagnostic Diagnostic Dominance: Right  LVEDP was 35 acutely.  Patient Profile     76 y.o. female with HTN, HLD, and tobacco use, who presented to [facility] with 3 days of SOB and CP and ECG c/w possible anterolateral STEMI which at cath demonstrated severe tandem LAD plus severe RCA and CFX disease. Presented with CHF/pulmonary edema. TCTS consulted and favors PCI.  LV mural thrombus.  There is global viability on MRI.    Assessment & Plan    Anterior MI, late presenting / Complex diffuse CAD: Global viability except the apex. RFM and start DAPT. Ambulate. Cardiac rehab.  Spontaneous dyspnea during the night.  This may be an anginal equivalent. Acute systolic heart failure; Quadruple therapy Apical thrombus: small thrombus. Needs anticoagulation but will interfere with PCI if needed. Shared decision making: prefers medication. PCI if meds fail. No CABG. Chronic pain syndrome with spinal stenosis: complicates medical therapy and procedure tolerance.  For questions or updates, please contact [place] Please consult  [email or web address] for contact info under        Signed, Sinclair Grooms, MD  08/06/2021, 7:36 AM

## 2021-08-06 NOTE — Progress Notes (Signed)
CARDIAC REHAB PHASE I   PRE:  Rate/Rhythm: 72 NSR  BP:  Sitting: 95/50      SaO2: 98 RA  MODE:  Ambulation: 380 ft   POST:  Rate/Rhythm: 92 NSR  BP:  Sitting: 97/49      SaO2: 100 RA   Seen pt from 1300-1433 pt was ambulated through hallways with RW, pt did take a break for SOB but no unusual ECG changes were observed. Pt gait is slow, pt is steady and balanced when using RW. Pt was returned to bed and educated on MI, restrictions, Angina NTG use, CHF, daily weights, and low NA diet. Pt is very stressed about deaths in her family; may need therapist. Pt also needs scale for home and pill box. SW notified.    Christen Bame  2:21 PM 08/06/2021

## 2021-08-06 NOTE — Care Management (Signed)
08-06-21 1445 Case Manager received a secure chat from [place] regarding patient in need of a scale and pill box. Unfortunately we cannot provide scales anymore; scales are only for self pay heart failure patients now. In the past Case Management could assist with getting pill boxes; however, not anymore. Patient will need to purchase a pill box from any outpatient pharmacy or local dollar tree. Case Manager reached out to [facility] and they do not have any pill boxes available. Case Manager will relay the information to the patient. CSW to follow up with housing resources.

## 2021-08-07 ENCOUNTER — Other Ambulatory Visit (HOSPITAL_COMMUNITY): Payer: Self-pay

## 2021-08-07 DIAGNOSIS — I513 Intracardiac thrombosis, not elsewhere classified: Secondary | ICD-10-CM | POA: Diagnosis not present

## 2021-08-07 DIAGNOSIS — E785 Hyperlipidemia, unspecified: Secondary | ICD-10-CM | POA: Diagnosis not present

## 2021-08-07 DIAGNOSIS — I2111 ST elevation (STEMI) myocardial infarction involving right coronary artery: Secondary | ICD-10-CM | POA: Diagnosis not present

## 2021-08-07 DIAGNOSIS — I5021 Acute systolic (congestive) heart failure: Secondary | ICD-10-CM | POA: Diagnosis not present

## 2021-08-07 LAB — BASIC METABOLIC PANEL
Anion gap: 10 (ref 5–15)
BUN: 20 mg/dL (ref 8–23)
CO2: 25 mmol/L (ref 22–32)
Calcium: 9.1 mg/dL (ref 8.9–10.3)
Chloride: 103 mmol/L (ref 98–111)
Creatinine, Ser: 1.02 mg/dL — ABNORMAL HIGH (ref 0.44–1.00)
GFR, Estimated: 57 mL/min — ABNORMAL LOW (ref >60)
Glucose, Bld: 101 mg/dL — ABNORMAL HIGH (ref 70–99)
Potassium: 5.1 mmol/L (ref 3.5–5.1)
Sodium: 138 mmol/L (ref 135–145)

## 2021-08-07 MED ORDER — TRAZODONE HCL 50 MG PO TABS
ORAL_TABLET | ORAL | Status: AC
  Filled 2021-08-07: qty 1

## 2021-08-07 MED ORDER — SODIUM CHLORIDE 0.9 % IV SOLN: 250.0000 mL | INTRAVENOUS | Status: DC | PRN

## 2021-08-07 MED ORDER — SODIUM CHLORIDE 0.9 % IV SOLN: INTRAVENOUS | Status: DC

## 2021-08-07 MED ORDER — TRAZODONE HCL 50 MG PO TABS
25.0000 mg | ORAL_TABLET | Freq: Every evening | ORAL | Status: DC | PRN
  Administered 2021-08-07 – 2021-08-08 (×3): 25 mg via ORAL
  Filled 2021-08-07 (×2): qty 1

## 2021-08-07 MED ORDER — SODIUM CHLORIDE 0.9% FLUSH: 3.0000 mL | INTRAVENOUS | Status: DC | PRN

## 2021-08-07 MED ORDER — SODIUM CHLORIDE 0.9% FLUSH
3.0000 mL | Freq: Two times a day (BID) | INTRAVENOUS | Status: DC
Start: 2021-08-07 — End: 2021-08-08
  Administered 2021-08-07 – 2021-08-08 (×2): 3 mL via INTRAVENOUS

## 2021-08-07 MED ORDER — ASPIRIN 81 MG PO CHEW
81.0000 mg | CHEWABLE_TABLET | ORAL | Status: AC
  Administered 2021-08-08: 81 mg via ORAL
  Filled 2021-08-07: qty 1

## 2021-08-07 NOTE — Progress Notes (Signed)
CARDIAC REHAB PHASE I   PRE:  Rate/Rhythm: 61 NSR  BP:  Sitting: 122/57      SaO2:   MODE:  Ambulation: 420 ft   POST:  Rate/Rhythm: 93 NSR  BP:  Sitting: 101/61      SaO2:    Seen pt from 1305-1431, pt was ambulated through hallways with RW. Pt had some SOB but felt "good" during ambulation. Pt son was present so I reinforced CHF education, restrictions, ex guidelines, and CRPII. Pt will be referred to CRPII at [facility].  Christen Bame  2:30 PM 08/07/2021

## 2021-08-07 NOTE — TOC Benefit Eligibility Note (Signed)
Patient Teacher, English as a foreign language completed.    The patient is currently admitted and upon discharge could be taking enoxaparin (Lovenox) 60 mg/0.6 ml.  The current 30 day co-pay is, $100.00.   The patient is currently admitted and upon discharge could be taking Eliquis 5 mg  The current 30 day co-pay is, $47.00.   The patient is insured through [place] Patient Advocate Specialist [place] Patient Advocate Team Direct Number: [phone]  Fax: [phone]

## 2021-08-07 NOTE — Care Management Important Message (Signed)
Important Message  Patient Details  Name: [name] MRN: [id number] Date of Birth: [birth year]   Medicare Important Message Given:  Yes     Orbie Pyo 08/07/2021, 4:49 PM

## 2021-08-07 NOTE — Progress Notes (Signed)
Patient in no respiratory distress at this time. No bipap needed

## 2021-08-07 NOTE — Progress Notes (Signed)
Progress Note  Patient Name: [name] Date of Encounter: 08/07/2021  [facility] Cardiologist: Minta Balsam, MD  Subjective   Now on floor.  Ambulating without chest discomfort or dyspnea.  There are myriad of other complaints including inability to sleep, leg and neck pain, numbness and discoloration in the tip of right index finger.  Refused surgery and also turned down by surgery.[name] about PCI.  I did convey to her that her case will be discussed at the heart team meeting in the a.m.  We talked about the current management strategy which is guideline directed therapy of systolic heart failure and anti-ischemic therapy.  Medical therapy is her first choice.  Inpatient Medications    Scheduled Meds:  aspirin EC  81 mg Oral Daily   atorvastatin  80 mg Oral QHS   carvedilol  3.125 mg Oral BID WC   Chlorhexidine Gluconate Cloth  6 each Topical Daily   clopidogrel  75 mg Oral Daily   dapagliflozin propanediol  10 mg Oral Daily   enoxaparin (LOVENOX) injection  60 mg Subcutaneous Q12H   gabapentin  800 mg Oral TID   polyethylene glycol  17 g Oral BID   sacubitril-valsartan  1 tablet Oral BID   sodium chloride flush  3 mL Intravenous Q12H   spironolactone  12.5 mg Oral Daily   Continuous Infusions:  sodium chloride Stopped (08/03/21 0151)   sodium chloride     PRN Meds: sodium chloride, acetaminophen, ALPRAZolam, bisacodyl, magnesium hydroxide, melatonin, nitroGLYCERIN, ondansetron (ZOFRAN) IV, oxyCODONE, sodium chloride flush, traZODone   Vital Signs    Vitals:   08/06/21 2144 08/06/21 2351 08/07/21 0539 08/07/21 0802  BP: (!) 111/40 (!) 92/50 (!) 94/55 100/84  Pulse: 73 82 72 77  Resp: _0 Temp: 98.2 F (36.8 C) 98.1 F (36.7 C) 97.7 F (36.5 C)   TempSrc: Oral Oral Oral   SpO2: 97% 100% 98% 99%  Weight: 63.8 kg     Height:        Intake/Output Summary (Last 24 hours) at 08/07/2021 0944 Last data filed at 08/07/2021 0752 Gross per 24  hour  Intake 848.41 ml  Output 900 ml  Net -51.59 ml      08/06/2021    9:44 PM 08/06/2021    4:00 AM 08/02/2021    6:38 PM  Last 3 Weights  Weight (lbs) 140 lb 9.6 oz 138 lb 14.2 oz 150 lb  Weight (kg) 63.776 kg 63 kg 68.04 kg      Telemetry    Normal sinus rhythm- Personally Reviewed  ECG    08/05/2021 with precordial poor R wave progression and Q waves V45 and 6.  Also inferior T wave abnormality as is present in anterior leads.  These are evolutionary changes compared to prior EKGs this admission.- Personally Reviewed  Physical Exam  Frail.  Did not sleep overnight. GEN: No acute distress.   Neck: No JVD Extremity: Large ecchymosis in the region of right radial access site.  Radial pulse is palpable.  The very tip of the right index finger is ashen appearing. Cardiac: RRR, no murmurs, rubs, or gallops.  Respiratory: Clear to auscultation bilaterally. GI: Soft, nontender, non-distended  MS: No edema; No deformity. Neuro:  Nonfocal  Psych: Normal affect   Labs    High Sensitivity Troponin:   Recent Labs  Lab 08/02/21 1906 08/02/21 2135 08/03/21 0033  TROPONINIHS 53* 78* 139*     Chemistry Recent Labs  Lab  08/02/21 1906 08/02/21 2135 08/03/21 0033 08/04/21 0443 08/05/21 0148 08/06/21 0219 08/07/21 0540  NA 140  --  142 139 139 137 138  K 3.7  --  3.6 4.2 3.8 4.0 5.1  CL 107  --  103 101 104 102 103  CO2 29  --  31 30 26 27 25  GLUCOSE 143*  --  123* 107* 103* 115* 101*  BUN 20  --  17 20 17 18 20  CREATININE 0.92  --  0.92 1.06* 0.89 0.90 1.02*  CALCIUM 8.8*  --  8.8* 9.5 9.6 9.2 9.1  MG  --  2.2 2.0 2.3  --   --   --   PROT 6.3*  --  5.9*  --   --   --   --   ALBUMIN 3.7  --  3.4*  --   --   --   --   AST 18  --  22  --   --   --   --   ALT 12  --  14  --   --   --   --   ALKPHOS 47  --  49  --   --   --   --   BILITOT 0.6  --  0.4  --   --   --   --   GFRNONAA >60  --  >60 54* >60 >60 57*  ANIONGAP 4*  --  8 8 9 8 10    Lipids  Recent Labs  Lab  08/02/21 1906  CHOL 153  TRIG 124  HDL 48  LDLCALC 80  CHOLHDL 3.2    Hematology Recent Labs  Lab 08/04/21 0443 08/05/21 0148 08/06/21 0219  WBC 8.4 8.1 7.0  RBC 4.66 5.07 4.66  HGB 14.3 15.2* 14.1  HCT 42.4 45.6 42.9  MCV 91.0 89.9 92.1  MCH 30.7 30.0 30.3  MCHC 33.7 33.3 32.9  RDW 14.0 14.1 14.1  PLT 163 194 179   Thyroid No results for input(s): "TSH", "FREET4" in the last 168 hours.  BNP Recent Labs  Lab 08/05/21 0148  BNP 513.5*    DDimer No results for input(s): "DDIMER" in the last 168 hours.   Radiology    No results found.  Cardiac Studies   Cardiac MRI 08/04/2021 IMPRESSION: No evidence of late gadolinium enhancement-suggestive of viable myocardium.   Small LV thrombus.   On: 08/04/2021 13:50  ADDENDUM: Reviewed CMR with Primary MD. Though there is some evidence of patchy LGE at the basal septum in the PSAX view, there is no transmural LGE in the mid or apex. LAD territory shows evidence of viability.  2D Doppler echocardiogram 08/03/2021:  IMPRESSIONS     1. Left ventricular ejection fraction, by estimation, is 30 to 35%. The  left ventricle has moderately decreased function. The left ventricle  demonstrates regional wall motion abnormalities (see scoring  diagram/findings for description). There is  moderate asymmetric left ventricular hypertrophy of the basal-septal  segment. Left ventricular diastolic parameters are consistent with Grade I  diastolic dysfunction (impaired relaxation).   2. LV apical mural thrombus   3. Right ventricular systolic function is normal. The right ventricular  size is normal. Tricuspid regurgitation signal is inadequate for assessing  PA pressure.   4. The mitral valve is degenerative. Trivial mitral valve regurgitation.  No evidence of mitral stenosis. Moderate mitral annular calcification.   5. The aortic valve was not well visualized. Aortic valve regurgitation  is trivial. No   aortic stenosis is present.    6. The inferior vena cava is normal in size with greater than 50%  respiratory variability, suggesting right atrial pressure of 3 mmHg.    08/02/2021 Coronary Angiography: Diagnostic Diagnostic Dominance: Right  LVEDP was 35 acutely.  Patient Profile     76 y.o. female with HTN, HLD, and tobacco use, who presented to [facility] ED with 3 days of SOB and CP and ECG c/w possible anterolateral STEMI which at cath demonstrated severe tandem LAD plus severe RCA and CFX disease. Presented with CHF/pulmonary edema. TCTS consulted and favors PCI.  LV mural thrombus.  There is global viability on MRI.    Assessment & Plan    Anterior MI, late presenting / Complex diffuse CAD: No heart failure symptoms or angina since admission.  ST elevation resolved and now precordial T wave inversion.  Cardiac MRI reveal distal apical LGE.  Have consented the patient to undergo higher risk LAD PCI.  We will present her at the heart team meeting in a.m.  She has been loaded with Plavix several days ago. Acute systolic heart failure; Quadruple therapy.  EF is less than 35%.  She has an LV apical thrombus which precludes Impella support Apical thrombus: small thrombus. Needs anticoagulation but will interfere with PCI if needed. Shared decision making: prefers medication. PCI if meds fail. No CABG. Chronic pain syndrome with spinal stenosis: complicates medical therapy and procedure tolerance.  We will post for PCI tomorrow.  Will discuss at heart team meeting in a.m.  The viability of anterior wall, I have in favor of treating the proximal LAD only and treatment of the lesions with medical therapy.  We will see what opinions arises from the heart team meeting.  I do believe the distal circumflex is approachable.  PDA will be much more complicated and likely cause ischemic complications.  For questions or updates, please contact [facility] Please consult [email or web address] for contact info under        Signed, Henry W  Smith III, MD  08/07/2021, 9:44 AM   

## 2021-08-08 ENCOUNTER — Encounter (HOSPITAL_COMMUNITY)
Admission: EM | Disposition: A | Discharge status: Home / Self Care | Payer: Self-pay | Source: Home / Self Care | Attending: Internal Medicine

## 2021-08-08 DIAGNOSIS — I237 Postinfarction angina: Secondary | ICD-10-CM

## 2021-08-08 DIAGNOSIS — I2102 ST elevation (STEMI) myocardial infarction involving left anterior descending coronary artery: Secondary | ICD-10-CM

## 2021-08-08 HISTORY — PX: CORONARY STENT INTERVENTION: CATH118234

## 2021-08-08 HISTORY — PX: INTRAVASCULAR ULTRASOUND/IVUS: CATH118244

## 2021-08-08 LAB — BASIC METABOLIC PANEL
Anion gap: 6 (ref 5–15)
BUN: 20 mg/dL (ref 8–23)
CO2: 25 mmol/L (ref 22–32)
Calcium: 8.8 mg/dL — ABNORMAL LOW (ref 8.9–10.3)
Chloride: 105 mmol/L (ref 98–111)
Creatinine, Ser: 0.91 mg/dL (ref 0.44–1.00)
GFR, Estimated: >60 mL/min (ref >60)
Glucose, Bld: 89 mg/dL (ref 70–99)
Potassium: 4.2 mmol/L (ref 3.5–5.1)
Sodium: 136 mmol/L (ref 135–145)

## 2021-08-08 LAB — POCT ACTIVATED CLOTTING TIME
Activated Clotting Time: 342 seconds
Activated Clotting Time: 269 seconds

## 2021-08-08 SURGERY — CORONARY STENT INTERVENTION
Anesthesia: LOCAL

## 2021-08-08 MED ORDER — LIDOCAINE HCL (PF) 1 % IJ SOLN
INTRAMUSCULAR | Status: AC
  Filled 2021-08-08: qty 30

## 2021-08-08 MED ORDER — FENTANYL CITRATE (PF) 100 MCG/2ML IJ SOLN
INTRAMUSCULAR | Status: AC
  Filled 2021-08-08: qty 2

## 2021-08-08 MED ORDER — SODIUM CHLORIDE 0.9 % WEIGHT BASED INFUSION
1.0000 mL/kg/h | INTRAVENOUS | Status: AC
  Administered 2021-08-08: 1 mL/kg/h via INTRAVENOUS

## 2021-08-08 MED ORDER — NITROGLYCERIN 1 MG/10 ML FOR IR/CATH LAB
INTRA_ARTERIAL | Status: DC | PRN
  Administered 2021-08-08 (×3): 100 ug via INTRACORONARY
  Administered 2021-08-08: 200 ug via INTRACORONARY

## 2021-08-08 MED ORDER — HEART ATTACK BOUNCING BOOK
Freq: Once | Status: AC
  Filled 2021-08-08: qty 1

## 2021-08-08 MED ORDER — LABETALOL HCL 5 MG/ML IV SOLN: 10.0000 mg | INTRAVENOUS | Status: AC | PRN

## 2021-08-08 MED ORDER — VERAPAMIL HCL 2.5 MG/ML IV SOLN
INTRAVENOUS | Status: AC
  Filled 2021-08-08: qty 2

## 2021-08-08 MED ORDER — HYDRALAZINE HCL 20 MG/ML IJ SOLN: 10.0000 mg | INTRAMUSCULAR | Status: AC | PRN

## 2021-08-08 MED ORDER — MIDAZOLAM HCL 2 MG/2ML IJ SOLN
INTRAMUSCULAR | Status: DC | PRN
  Administered 2021-08-08 (×2): 1 mg via INTRAVENOUS

## 2021-08-08 MED ORDER — SODIUM CHLORIDE 0.9 % IV SOLN: 250.0000 mL | INTRAVENOUS | Status: DC | PRN

## 2021-08-08 MED ORDER — NITROGLYCERIN 1 MG/10 ML FOR IR/CATH LAB
INTRA_ARTERIAL | Status: AC
  Filled 2021-08-08: qty 10

## 2021-08-08 MED ORDER — FENTANYL CITRATE (PF) 100 MCG/2ML IJ SOLN
INTRAMUSCULAR | Status: DC | PRN
  Administered 2021-08-08 (×2): 25 ug via INTRAVENOUS

## 2021-08-08 MED ORDER — HEPARIN SODIUM (PORCINE) 1000 UNIT/ML IJ SOLN
INTRAMUSCULAR | Status: AC
  Filled 2021-08-08: qty 10

## 2021-08-08 MED ORDER — VERAPAMIL HCL 2.5 MG/ML IV SOLN
INTRAVENOUS | Status: DC | PRN
  Administered 2021-08-08: 10 mL via INTRA_ARTERIAL

## 2021-08-08 MED ORDER — HEPARIN (PORCINE) IN NACL 1000-0.9 UT/500ML-% IV SOLN
INTRAVENOUS | Status: DC | PRN
  Administered 2021-08-08 (×2): 500 mL

## 2021-08-08 MED ORDER — MIDAZOLAM HCL 2 MG/2ML IJ SOLN
INTRAMUSCULAR | Status: AC
  Filled 2021-08-08: qty 2

## 2021-08-08 MED ORDER — SODIUM CHLORIDE 0.9% FLUSH
3.0000 mL | Freq: Two times a day (BID) | INTRAVENOUS | Status: DC
  Administered 2021-08-09 (×2): 3 mL via INTRAVENOUS

## 2021-08-08 MED ORDER — HEPARIN SODIUM (PORCINE) 1000 UNIT/ML IJ SOLN
INTRAMUSCULAR | Status: DC | PRN
  Administered 2021-08-08: 2000 [IU] via INTRAVENOUS
  Administered 2021-08-08: 6000 [IU] via INTRAVENOUS

## 2021-08-08 MED ORDER — HEPARIN (PORCINE) IN NACL 1000-0.9 UT/500ML-% IV SOLN
INTRAVENOUS | Status: AC
  Filled 2021-08-08: qty 1000

## 2021-08-08 MED ORDER — IOHEXOL 350 MG/ML SOLN
INTRAVENOUS | Status: DC | PRN
  Administered 2021-08-08: 170 mL

## 2021-08-08 MED ORDER — SODIUM CHLORIDE 0.9% FLUSH: 3.0000 mL | INTRAVENOUS | Status: DC | PRN

## 2021-08-08 SURGICAL SUPPLY — 27 items
BALL SAPPHIRE NC24 3.25X15 (BALLOONS) ×2
BALLN SAPPHIRE 2.0X12 (BALLOONS) ×2
BALLN SAPPHIRE 2.5X12 (BALLOONS) ×2
BALLOON SAPPHIRE 2.0X12 (BALLOONS) IMPLANT
BALLOON SAPPHIRE 2.5X12 (BALLOONS) IMPLANT
BALLOON SAPPHIRE NC24 3.25X15 (BALLOONS) IMPLANT
BAND CMPR LRG ZPHR (HEMOSTASIS) ×1
BAND ZEPHYR COMPRESS 30 LONG (HEMOSTASIS) ×1 IMPLANT
CATH OPTICROSS HD (CATHETERS) ×1 IMPLANT
CATH VISTA GUIDE 6FR XB3 SH (CATHETERS) ×1 IMPLANT
CATH VISTA GUIDE 6FR XBLAD3.5 (CATHETERS) ×1 IMPLANT
ELECT DEFIB PAD ADLT CADENCE (PAD) ×1 IMPLANT
GLIDESHEATH SLEND SS 6F .021 (SHEATH) ×1 IMPLANT
GUIDEWIRE INQWIRE 1.5J.035X260 (WIRE) IMPLANT
INQWIRE 1.5J .035X260CM (WIRE) ×2
KIT ENCORE 26 ADVANTAGE (KITS) ×1 IMPLANT
KIT HEART LEFT (KITS) ×3 IMPLANT
KIT HEMO VALVE WATCHDOG (MISCELLANEOUS) ×1 IMPLANT
PACK CARDIAC CATHETERIZATION (CUSTOM PROCEDURE TRAY) ×3 IMPLANT
SLED PULL BACK IVUS (MISCELLANEOUS) ×1 IMPLANT
STENT SYNERGY XD 2.25X32 (Permanent Stent) IMPLANT
STENT SYNERGY XD 3.0X20 (Permanent Stent) IMPLANT
SYNERGY XD 2.25X32 (Permanent Stent) ×2 IMPLANT
SYNERGY XD 3.0X20 (Permanent Stent) ×2 IMPLANT
TRANSDUCER W/STOPCOCK (MISCELLANEOUS) ×3 IMPLANT
TUBING CIL FLEX 10 FLL-RA (TUBING) ×3 IMPLANT
WIRE COUGAR XT STRL 190CM (WIRE) ×1 IMPLANT

## 2021-08-08 NOTE — Progress Notes (Addendum)
Epigastric pain 7/10, aching, going to the lower chest. No DOB, stable vitals signs. EKG done. Nitro SL PRN given. Patient verbalized decreased in pain to 2/10. Informed Rose, MD. Will monitor. Patient agreed to try trazodone then Xanax and melatonin thereafter.   0200H rounds made. Patient asleep. Will monitor.

## 2021-08-08 NOTE — Interval H&P Note (Signed)
Cath Lab Visit (complete for each Cath Lab visit)  Clinical Evaluation Leading to the Procedure:   ACS: Yes.    Non-ACS:    Anginal Classification: CCS IV  Anti-ischemic medical therapy: Minimal Therapy (1 class of medications)  Non-Invasive Test Results: No non-invasive testing performed  Prior CABG: No previous CABG      History and Physical Interval Note:  08/08/2021 12:52 PM  [name]  has presented today for surgery, with the diagnosis of CAD.  The various methods of treatment have been discussed with the patient and family. After consideration of risks, benefits and other options for treatment, the patient has consented to  Procedure(s): CORONARY STENT INTERVENTION (N/A) as a surgical intervention.  The patient's history has been reviewed, patient examined, no change in status, stable for surgery.  I have reviewed the patient's chart and labs.  Questions were answered to the patient's satisfaction.     Sherren Mocha

## 2021-08-08 NOTE — Plan of Care (Signed)
  Problem: Education: Goal: Understanding of CV disease, CV risk reduction, and recovery process will improve Outcome: Progressing Goal: Individualized Educational Video(s) Outcome: Progressing   Problem: Cardiovascular: Goal: Ability to achieve and maintain adequate cardiovascular perfusion will improve Outcome: Progressing Goal: Vascular access site(s) Level 0-1 will be maintained Outcome: Progressing   Problem: Health Behavior/Discharge Planning: Goal: Ability to safely manage health-related needs after discharge will improve Outcome: Progressing   Problem: Pain Managment: Goal: General experience of comfort will improve Outcome: Progressing   Problem: Safety: Goal: Ability to remain free from injury will improve Outcome: Progressing   Problem: Skin Integrity: Goal: Risk for impaired skin integrity will decrease Outcome: Progressing

## 2021-08-08 NOTE — Progress Notes (Signed)
Pt received from cath lab AxOx4, VS wnL and as per flow. (R) radial C/D/I level 0 no hematoma. Previous bruising present covering entire wrist. All questions and concerns addressed. Call bell placed within reach, will continue to monitor and maintain safety.

## 2021-08-08 NOTE — Progress Notes (Addendum)
Progress Note  Patient Name: [name] Date of Encounter: 08/08/2021  [facility] Cardiologist: Minta Balsam, MD  Subjective   Epigastric discomfort relieved by SL NTG last PM/early AM. ECG abnormality noted similar to EKG on admission.  Complaining that we are not interrupting her sleep.  She has not been able to sleep all night.  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  Inpatient Medications    Scheduled Meds:  aspirin  81 mg Oral Pre-Cath   aspirin EC  81 mg Oral Daily   atorvastatin  80 mg Oral QHS   carvedilol  3.125 mg Oral BID WC   Chlorhexidine Gluconate Cloth  6 each Topical Daily   clopidogrel  75 mg Oral Daily   dapagliflozin propanediol  10 mg Oral Daily   enoxaparin (LOVENOX) injection  60 mg Subcutaneous Q12H   gabapentin  800 mg Oral TID   heart attack bouncing book   Does not apply Once   polyethylene glycol  17 g Oral BID   sodium chloride flush  3 mL Intravenous Q12H   sodium chloride flush  3 mL Intravenous Q12H   Continuous Infusions:  sodium chloride Stopped (08/03/21 0151)   sodium chloride     sodium chloride     sodium chloride     PRN Meds: sodium chloride, sodium chloride, acetaminophen, ALPRAZolam, bisacodyl, magnesium hydroxide, melatonin, nitroGLYCERIN, ondansetron (ZOFRAN) IV, oxyCODONE, sodium chloride flush, sodium chloride flush, traZODone   Vital Signs    Vitals:   08/07/21 1622 08/07/21 2104 08/07/21 2112 08/08/21 0000  BP: 110/78 (!) 102/51  101/64  Pulse: 85 85  67  Resp: '18 17  19  '$ Temp:  97.9 F (36.6 C)  98.2 F (36.8 C)  TempSrc:  Oral  Oral  SpO2:  97%  100%  Weight:   63.3 kg   Height:        Intake/Output Summary (Last 24 hours) at 08/08/2021 0637 Last data filed at 08/07/2021 2000 Gross per 24 hour  Intake 240 ml  Output 550 ml  Net -310 ml      08/07/2021    9:12 PM 08/06/2021    9:44 PM 08/06/2021    4:00 AM  Last 3 Weights  Weight (lbs) 139 lb 8 oz 140 lb 9.6 oz  138 lb 14.2 oz  Weight (kg) 63.277 kg 63.776 kg 63 kg      Telemetry    Normal sinus rhythm- Personally Reviewed  ECG    ECG at MN with pain showed anterior STE.  Repeat EKG this morning and absence of pain revealed minimal persistent ST elevation.- Personally Reviewed  Physical Exam  Frail.  Miserable in bed primarily because she has been unable to sleep all night due to insomnia. GEN: No acute distress.   Neck: No JVD Extremity: Large ecchymosis in the region of right radial access site.  Radial pulse is palpable.  The very tip of the right index finger is ashen appearing. Cardiac: RRR, no murmurs, rubs, or gallops.  Respiratory: Clear to auscultation bilaterally. GI: Soft, nontender, non-distended  MS: No edema; No deformity. Neuro:  Nonfocal  Psych: Normal affect   Labs    High Sensitivity Troponin:   Recent Labs  Lab 08/02/21 1906 08/02/21 2135 08/03/21 0033  TROPONINIHS 53* 78* 139*     Chemistry Recent Labs  Lab 08/02/21 1906 08/02/21 2135 08/03/21 0033 08/04/21 0443 08/05/21 0148 08/06/21 0219 08/07/21 0540  NA 140  --  142 139 139 137 138  K 3.7  --  3.6 4.2 3.8 4.0 5.1  CL 107  --  103 101 104 102 103  CO2 29  --  '31 30 26 27 25  '$ GLUCOSE 143*  --  123* 107* 103* 115* 101*  BUN 20  --  '17 20 17 18 20  '$ CREATININE 0.92  --  0.92 1.06* 0.89 0.90 1.02*  CALCIUM 8.8*  --  8.8* 9.5 9.6 9.2 9.1  MG  --  2.2 2.0 2.3  --   --   --   PROT 6.3*  --  5.9*  --   --   --   --   ALBUMIN 3.7  --  3.4*  --   --   --   --   AST 18  --  22  --   --   --   --   ALT 12  --  14  --   --   --   --   ALKPHOS 47  --  49  --   --   --   --   BILITOT 0.6  --  0.4  --   --   --   --   GFRNONAA >60  --  >60 54* >60 >60 57*  ANIONGAP 4*  --  '8 8 9 8 10    '$ Lipids  Recent Labs  Lab 08/02/21 1906  CHOL 153  TRIG 124  HDL 48  LDLCALC 80  CHOLHDL 3.2    Hematology Recent Labs  Lab 08/04/21 0443 08/05/21 0148 08/06/21 0219  WBC 8.4 8.1 7.0  RBC 4.66 5.07 4.66  HGB  14.3 15.2* 14.1  HCT 42.4 45.6 42.9  MCV 91.0 89.9 92.1  MCH 30.7 30.0 30.3  MCHC 33.7 33.3 32.9  RDW 14.0 14.1 14.1  PLT 163 194 179   Thyroid No results for input(s): "TSH", "FREET4" in the last 168 hours.  BNP Recent Labs  Lab 08/05/21 0148  BNP 513.5*    DDimer No results for input(s): "DDIMER" in the last 168 hours.   Radiology    No results found.  Cardiac Studies   Cardiac MRI 08/04/2021 IMPRESSION: No evidence of late gadolinium enhancement-suggestive of viable myocardium.   Small LV thrombus.   On: 08/04/2021 13:50  ADDENDUM: Reviewed CMR with Primary MD. Though there is some evidence of patchy LGE at the basal septum in the PSAX view, there is no transmural LGE in the mid or apex. LAD territory shows evidence of viability.  2D Doppler echocardiogram 08/03/2021:  IMPRESSIONS     1. Left ventricular ejection fraction, by estimation, is 30 to 35%. The  left ventricle has moderately decreased function. The left ventricle  demonstrates regional wall motion abnormalities (see scoring  diagram/findings for description). There is  moderate asymmetric left ventricular hypertrophy of the basal-septal  segment. Left ventricular diastolic parameters are consistent with Grade I  diastolic dysfunction (impaired relaxation).   2. LV apical mural thrombus   3. Right ventricular systolic function is normal. The right ventricular  size is normal. Tricuspid regurgitation signal is inadequate for assessing  PA pressure.   4. The mitral valve is degenerative. Trivial mitral valve regurgitation.  No evidence of mitral stenosis. Moderate mitral annular calcification.   5. The aortic valve was not well visualized. Aortic valve regurgitation  is trivial. No aortic stenosis is present.   6. The inferior vena cava is normal in size with greater than 50%  respiratory variability, suggesting right atrial pressure of 3 mmHg.    08/02/2021 Coronary  Angiography: Diagnostic Diagnostic Dominance: Right  LVEDP was 35 acutely.  Patient Profile     76 y.o. female with HTN, HLD, LPa >200, and tobacco use, who presented to [facility] with 3 days of SOB and CP and ECG c/w possible anterolateral STEMI which at cath demonstrated severe tandem LAD plus severe RCA and CFX disease. Presented with CHF/pulmonary edema. TCTS consulted and favors PCI.  LV mural thrombus.  There is global viability on MRI.    Assessment & Plan    Anterior MI, late presenting / Complex diffuse CAD: Recurrent discomfort last PM requiring SL NTG. First discomfort since admission. Will check ECG.  Acute systolic heart failure; Quadruple therapy.  EF is less than 35%.  She has an LV apical thrombus which precludes Impella support Apical thrombus: small thrombus. Needs anticoagulation but will interfere with PCI if needed. Shared decision making: prefers medication. PCI if meds fail. No CABG. Chronic pain syndrome with spinal stenosis: complicates medical therapy and procedure tolerance. Numb right index finger: Possibly had ischemic injury during sheath  Recurrent CP requiring SL NTG.  She is asymptomatic now.  There is minimal ST elevation.  Discussed case at Heart Team and proximal LAD stent agreed upon.   Resume paused heart failure therapy tomorrow: Entresto 24/26 mg twice daily and spironolactone 12.5 mg Monday, Wednesday, and Friday as tolerated by blood pressure and kidney function after PCI.  For questions or updates, please contact [place] Please consult [email or web address] for contact info under        Signed, Sinclair Grooms, MD  08/08/2021, 6:37 AM

## 2021-08-08 NOTE — Progress Notes (Signed)
Pt refuse CPAP

## 2021-08-08 NOTE — H&P (View-Only) (Signed)
Progress Note  Patient Name: [name] Date of Encounter: 08/08/2021  [facility] Cardiologist: Minta Balsam, MD  Subjective   Epigastric discomfort relieved by SL NTG last PM/early AM. ECG abnormality noted similar to EKG on admission.  Complaining that we are not interrupting her sleep.  She has not been able to sleep all night.  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  Inpatient Medications    Scheduled Meds:  aspirin  81 mg Oral Pre-Cath   aspirin EC  81 mg Oral Daily   atorvastatin  80 mg Oral QHS   carvedilol  3.125 mg Oral BID WC   Chlorhexidine Gluconate Cloth  6 each Topical Daily   clopidogrel  75 mg Oral Daily   dapagliflozin propanediol  10 mg Oral Daily   enoxaparin (LOVENOX) injection  60 mg Subcutaneous Q12H   gabapentin  800 mg Oral TID   heart attack bouncing book   Does not apply Once   polyethylene glycol  17 g Oral BID   sodium chloride flush  3 mL Intravenous Q12H   sodium chloride flush  3 mL Intravenous Q12H   Continuous Infusions:  sodium chloride Stopped (08/03/21 0151)   sodium chloride     sodium chloride     sodium chloride     PRN Meds: sodium chloride, sodium chloride, acetaminophen, ALPRAZolam, bisacodyl, magnesium hydroxide, melatonin, nitroGLYCERIN, ondansetron (ZOFRAN) IV, oxyCODONE, sodium chloride flush, sodium chloride flush, traZODone   Vital Signs    Vitals:   08/07/21 1622 08/07/21 2104 08/07/21 2112 08/08/21 0000  BP: 110/78 (!) 102/51  101/64  Pulse: 85 85  67  Resp: '18 17  19  '$ Temp:  97.9 F (36.6 C)  98.2 F (36.8 C)  TempSrc:  Oral  Oral  SpO2:  97%  100%  Weight:   63.3 kg   Height:        Intake/Output Summary (Last 24 hours) at 08/08/2021 0637 Last data filed at 08/07/2021 2000 Gross per 24 hour  Intake 240 ml  Output 550 ml  Net -310 ml      08/07/2021    9:12 PM 08/06/2021    9:44 PM 08/06/2021    4:00 AM  Last 3 Weights  Weight (lbs) 139 lb 8 oz 140 lb 9.6 oz  138 lb 14.2 oz  Weight (kg) 63.277 kg 63.776 kg 63 kg      Telemetry    Normal sinus rhythm- Personally Reviewed  ECG    ECG at [facility] with pain showed anterior STE.  Repeat EKG this morning and absence of pain revealed minimal persistent ST elevation.- Personally Reviewed  Physical Exam  Frail.  Miserable in bed primarily because she has been unable to sleep all night due to insomnia. GEN: No acute distress.   Neck: No JVD Extremity: Large ecchymosis in the region of right radial access site.  Radial pulse is palpable.  The very tip of the right index finger is ashen appearing. Cardiac: RRR, no murmurs, rubs, or gallops.  Respiratory: Clear to auscultation bilaterally. GI: Soft, nontender, non-distended  MS: No edema; No deformity. Neuro:  Nonfocal  Psych: Normal affect   Labs    High Sensitivity Troponin:   Recent Labs  Lab 08/02/21 1906 08/02/21 2135 08/03/21 0033  TROPONINIHS 53* 78* 139*     Chemistry Recent Labs  Lab 08/02/21 1906 08/02/21 2135 08/03/21 0033 08/04/21 0443 08/05/21 0148 08/06/21 0219 08/07/21 0540  NA 140  --  142 139 139 137 138  K 3.7  --  3.6 4.2 3.8 4.0 5.1  CL 107  --  103 101 104 102 103  CO2 29  --  '31 30 26 27 25  '$ GLUCOSE 143*  --  123* 107* 103* 115* 101*  BUN 20  --  '17 20 17 18 20  '$ CREATININE 0.92  --  0.92 1.06* 0.89 0.90 1.02*  CALCIUM 8.8*  --  8.8* 9.5 9.6 9.2 9.1  MG  --  2.2 2.0 2.3  --   --   --   PROT 6.3*  --  5.9*  --   --   --   --   ALBUMIN 3.7  --  3.4*  --   --   --   --   AST 18  --  22  --   --   --   --   ALT 12  --  14  --   --   --   --   ALKPHOS 47  --  49  --   --   --   --   BILITOT 0.6  --  0.4  --   --   --   --   GFRNONAA >60  --  >60 54* >60 >60 57*  ANIONGAP 4*  --  '8 8 9 8 10    '$ Lipids  Recent Labs  Lab 08/02/21 1906  CHOL 153  TRIG 124  HDL 48  LDLCALC 80  CHOLHDL 3.2    Hematology Recent Labs  Lab 08/04/21 0443 08/05/21 0148 08/06/21 0219  WBC 8.4 8.1 7.0  RBC 4.66 5.07 4.66  HGB  14.3 15.2* 14.1  HCT 42.4 45.6 42.9  MCV 91.0 89.9 92.1  MCH 30.7 30.0 30.3  MCHC 33.7 33.3 32.9  RDW 14.0 14.1 14.1  PLT 163 194 179   Thyroid No results for input(s): "TSH", "FREET4" in the last 168 hours.  BNP Recent Labs  Lab 08/05/21 0148  BNP 513.5*    DDimer No results for input(s): "DDIMER" in the last 168 hours.   Radiology    No results found.  Cardiac Studies   Cardiac MRI 08/04/2021 IMPRESSION: No evidence of late gadolinium enhancement-suggestive of viable myocardium.   Small LV thrombus.   On: 08/04/2021 13:50  ADDENDUM: Reviewed CMR with Primary MD. Though there is some evidence of patchy LGE at the basal septum in the PSAX view, there is no transmural LGE in the mid or apex. LAD territory shows evidence of viability.  2D Doppler echocardiogram 08/03/2021:  IMPRESSIONS     1. Left ventricular ejection fraction, by estimation, is 30 to 35%. The  left ventricle has moderately decreased function. The left ventricle  demonstrates regional wall motion abnormalities (see scoring  diagram/findings for description). There is  moderate asymmetric left ventricular hypertrophy of the basal-septal  segment. Left ventricular diastolic parameters are consistent with Grade I  diastolic dysfunction (impaired relaxation).   2. LV apical mural thrombus   3. Right ventricular systolic function is normal. The right ventricular  size is normal. Tricuspid regurgitation signal is inadequate for assessing  PA pressure.   4. The mitral valve is degenerative. Trivial mitral valve regurgitation.  No evidence of mitral stenosis. Moderate mitral annular calcification.   5. The aortic valve was not well visualized. Aortic valve regurgitation  is trivial. No aortic stenosis is present.   6. The inferior vena cava is normal in size with greater than 50%  respiratory variability, suggesting right atrial pressure of 3 mmHg.    08/02/2021 Coronary  Angiography: Diagnostic Diagnostic Dominance: Right  LVEDP was 35 acutely.  Patient Profile     76 y.o. female with HTN, HLD, LPa >200, and tobacco use, who presented to [facility] ED with 3 days of SOB and CP and ECG c/w possible anterolateral STEMI which at cath demonstrated severe tandem LAD plus severe RCA and CFX disease. Presented with CHF/pulmonary edema. TCTS consulted and favors PCI.  LV mural thrombus.  There is global viability on MRI.    Assessment & Plan    Anterior MI, late presenting / Complex diffuse CAD: Recurrent discomfort last PM requiring SL NTG. First discomfort since admission. Will check ECG.  Acute systolic heart failure; Quadruple therapy.  EF is less than 35%.  She has an LV apical thrombus which precludes Impella support Apical thrombus: small thrombus. Needs anticoagulation but will interfere with PCI if needed. Shared decision making: prefers medication. PCI if meds fail. No CABG. Chronic pain syndrome with spinal stenosis: complicates medical therapy and procedure tolerance. Numb right index finger: Possibly had ischemic injury during sheath  Recurrent CP requiring SL NTG.  She is asymptomatic now.  There is minimal ST elevation.  Discussed case at Heart Team and proximal LAD stent agreed upon.   Resume paused heart failure therapy tomorrow: Entresto 24/26 mg twice daily and spironolactone 12.5 mg Monday, Wednesday, and Friday as tolerated by blood pressure and kidney function after PCI.  For questions or updates, please contact [place] Please consult [email or web address] for contact info under        Signed, Sinclair Grooms, MD  08/08/2021, 6:37 AM

## 2021-08-09 DIAGNOSIS — I2101 ST elevation (STEMI) myocardial infarction involving left main coronary artery: Secondary | ICD-10-CM

## 2021-08-09 LAB — BASIC METABOLIC PANEL
Anion gap: 8 (ref 5–15)
BUN: 16 mg/dL (ref 8–23)
CO2: 26 mmol/L (ref 22–32)
Calcium: 8.8 mg/dL — ABNORMAL LOW (ref 8.9–10.3)
Chloride: 104 mmol/L (ref 98–111)
Creatinine, Ser: 0.83 mg/dL (ref 0.44–1.00)
GFR, Estimated: >60 mL/min (ref >60)
Glucose, Bld: 87 mg/dL (ref 70–99)
Potassium: 4.1 mmol/L (ref 3.5–5.1)
Sodium: 138 mmol/L (ref 135–145)

## 2021-08-09 LAB — CBC
HCT: 36.8 % (ref 36.0–46.0)
Hemoglobin: 12.4 g/dL (ref 12.0–15.0)
MCH: 30.6 pg (ref 26.0–34.0)
MCHC: 33.7 g/dL (ref 30.0–36.0)
MCV: 90.9 fL (ref 80.0–100.0)
Platelets: 156 10*3/uL (ref 150–400)
RBC: 4.05 MIL/uL (ref 3.87–5.11)
RDW: 14.1 % (ref 11.5–15.5)
WBC: 5.5 10*3/uL (ref 4.0–10.5)
nRBC: 0 % (ref 0.0–0.2)

## 2021-08-09 MED ORDER — ENTRESTO 24-26 MG PO TABS
1.0000 | ORAL_TABLET | Freq: Two times a day (BID) | ORAL | 3 refills | Status: DC
Start: 2021-08-09 — End: 2021-08-28

## 2021-08-09 MED ORDER — APIXABAN 5 MG PO TABS: 5.0000 mg | ORAL_TABLET | Freq: Two times a day (BID) | ORAL | 11 refills | Status: DC

## 2021-08-09 MED ORDER — LOSARTAN POTASSIUM 25 MG PO TABS: 12.5000 mg | ORAL_TABLET | Freq: Every day | ORAL | 6 refills | Status: DC

## 2021-08-09 MED ORDER — MELATONIN 5 MG PO TABS: 5.0000 mg | ORAL_TABLET | Freq: Every evening | ORAL | 0 refills | Status: DC | PRN

## 2021-08-09 MED ORDER — ATORVASTATIN CALCIUM 80 MG PO TABS: 80.0000 mg | ORAL_TABLET | Freq: Every day | ORAL | 11 refills | Status: DC

## 2021-08-09 MED ORDER — ASPIRIN 81 MG PO TBEC: 81.0000 mg | DELAYED_RELEASE_TABLET | Freq: Every day | ORAL | 0 refills | Status: DC

## 2021-08-09 MED ORDER — CLOPIDOGREL BISULFATE 75 MG PO TABS: 75.0000 mg | ORAL_TABLET | Freq: Every day | ORAL | 11 refills | Status: DC

## 2021-08-09 MED ORDER — CARVEDILOL 3.125 MG PO TABS: 3.1250 mg | ORAL_TABLET | Freq: Two times a day (BID) | ORAL | 11 refills | Status: DC

## 2021-08-09 MED ORDER — DAPAGLIFLOZIN PROPANEDIOL 10 MG PO TABS: 10.0000 mg | ORAL_TABLET | Freq: Every day | ORAL | 11 refills | Status: DC

## 2021-08-09 MED ORDER — BISACODYL 10 MG RE SUPP: 10.0000 mg | Freq: Every day | RECTAL | 0 refills | Status: AC | PRN

## 2021-08-09 MED ORDER — LOSARTAN POTASSIUM 25 MG PO TABS
12.5000 mg | ORAL_TABLET | Freq: Every day | ORAL | Status: DC
  Filled 2021-08-09: qty 1

## 2021-08-09 MED ORDER — SPIRONOLACTONE 25 MG PO TABS: 12.5000 mg | ORAL_TABLET | ORAL | 1 refills | Status: DC

## 2021-08-09 MED ORDER — NITROGLYCERIN 0.4 MG SL SUBL: 0.4000 mg | SUBLINGUAL_TABLET | SUBLINGUAL | 12 refills | Status: DC | PRN

## 2021-08-09 NOTE — Plan of Care (Signed)
  Problem: Education: Goal: Understanding of CV disease, CV risk reduction, and recovery process will improve Outcome: Adequate for Discharge Goal: Individualized Educational Video(s) Outcome: Adequate for Discharge   Problem: Activity: Goal: Ability to return to baseline activity level will improve Outcome: Adequate for Discharge   Problem: Cardiovascular: Goal: Ability to achieve and maintain adequate cardiovascular perfusion will improve Outcome: Adequate for Discharge Goal: Vascular access site(s) Level 0-1 will be maintained Outcome: Adequate for Discharge   Problem: Health Behavior/Discharge Planning: Goal: Ability to safely manage health-related needs after discharge will improve Outcome: Adequate for Discharge   Problem: Education: Goal: Understanding of cardiac disease, CV risk reduction, and recovery process will improve Outcome: Adequate for Discharge Goal: Understanding of medication regimen will improve Outcome: Adequate for Discharge Goal: Individualized Educational Video(s) Outcome: Adequate for Discharge   Problem: Activity: Goal: Ability to tolerate increased activity will improve Outcome: Adequate for Discharge   Problem: Cardiac: Goal: Ability to achieve and maintain adequate cardiopulmonary perfusion will improve Outcome: Adequate for Discharge Goal: Vascular access site(s) Level 0-1 will be maintained Outcome: Adequate for Discharge   Problem: Health Behavior/Discharge Planning: Goal: Ability to safely manage health-related needs after discharge will improve Outcome: Adequate for Discharge   Problem: Education: Goal: Knowledge of General Education information will improve Description: Including pain rating scale, medication(s)/side effects and non-pharmacologic comfort measures Outcome: Adequate for Discharge   Problem: Health Behavior/Discharge Planning: Goal: Ability to manage health-related needs will improve Outcome: Adequate for Discharge    Problem: Clinical Measurements: Goal: Ability to maintain clinical measurements within normal limits will improve Outcome: Adequate for Discharge Goal: Will remain free from infection Outcome: Adequate for Discharge Goal: Diagnostic test results will improve Outcome: Adequate for Discharge Goal: Respiratory complications will improve Outcome: Adequate for Discharge Goal: Cardiovascular complication will be avoided Outcome: Adequate for Discharge   Problem: Activity: Goal: Risk for activity intolerance will decrease Outcome: Adequate for Discharge   Problem: Nutrition: Goal: Adequate nutrition will be maintained Outcome: Adequate for Discharge   Problem: Coping: Goal: Level of anxiety will decrease Outcome: Adequate for Discharge   Problem: Elimination: Goal: Will not experience complications related to bowel motility Outcome: Adequate for Discharge Goal: Will not experience complications related to urinary retention Outcome: Adequate for Discharge   Problem: Pain Managment: Goal: General experience of comfort will improve Outcome: Adequate for Discharge   Problem: Safety: Goal: Ability to remain free from injury will improve Outcome: Adequate for Discharge   Problem: Skin Integrity: Goal: Risk for impaired skin integrity will decrease Outcome: Adequate for Discharge

## 2021-08-09 NOTE — Progress Notes (Addendum)
Patient seen and examined. Chest pain free currently. [name] discharge home on current medications plus entresto 24/26 mg BID, aldactone 12.5 mg MWF, eliquis for heart failure, LV thrombus, and CAD with new stents. Would stop losartan.  Allegra Lai, MD

## 2021-08-09 NOTE — Discharge Summary (Addendum)
Discharge Summary    Patient ID: [name] MRN: [id number]; DOB: [birth year]  Admit date: 08/02/2021 Discharge date: 08/09/2021  PCP:  Karleen Hampshire., MD   [facility] Providers Cardiologist:  None      Dr Percival Spanish saw initially, but she prefers follow-up in [facility]  Discharge Diagnoses    Principal Problem:   STEMI (ST elevation myocardial infarction) [facility]) Active Problems:   Hyperlipidemia LDL goal <70   Left ventricular apical thrombus   Acute systolic heart failure ([place])   Diagnostic Studies/Procedures    CARDIAC CATH:   Mid LAD-1 lesion is 80% stenosed.   Mid LAD-2 lesion is 90% stenosed.   Post intervention, there is a 0% residual stenosis.   Post intervention, there is a 0% residual stenosis.   Successful PCI of the proximal and mid LAD tandem stenoses using a 3.0 x 20 mm Synergy DES over the proximal lesion and a 2.25 x 32 mm Synergy DES over the distal lesions.  The procedure is guided by intravascular ultrasound.   Recommendations: Dual antiplatelet therapy with aspirin and clopidogrel x12 months without interruption.  Aggressive risk reduction measures and anti-ischemic therapy for residual ischemic heart disease. Diagnostic Dominance: Right  Intervention   Cardiac MRI: 08/04/2021 FINDINGS: 1. Normal left ventricular size, with LVEDD 39 mm, and LVEDVi 60 mL/m2.   Moderate asymmetric septal hypertrophy, with intraventricular septal thickness of 14 mm, posterior wall thickness of 8 mm, and myocardial mass index mildly increased at 66 g/m2.   Normal left ventricular systolic function (LVEF =99%). Global hypokinesis with hypokinesis in the basal, anteroseptal and severe hypokinesis mid and apex. Notably, mid inferior, inferoseptal and apical inferior are akinetic.   There is a small LV apical thrombus.   There is no late gadolinium enhancement in the left ventricular myocardium.   2. Normal right ventricular size with RVEDVI 29 mL/m2.   Normal right  ventricular thickness.   Normal right ventricular systolic function (RVEF =83%). There are no regional wall motion abnormalities or aneurysms.   3.  Normal left and right atrial size.   4.  Normal size of the pulmonary artery.   Ascending aorta is at the upper limit of normal for age and BSA, 39 mm.   5. Valve assessment:   Aortic Valve: Not well visualized. Qualitatively, there is no significant regurgitation.   Pulmonic Valve: Qualitatively, there is no significant regurgitation.   Tricuspid Valve: Qualitatively, there is no significant regurgitation.   Mitral Valve: Abnormally thickened with posterior leaflet restriction. There is no significant regurgitation.   6. Normal pericardium. No pericardial effusion. Prominent pericardial fat.   7. Grossly, no additional extracardiac findings, outside of bilateral pleural effusions. Recommended dedicated study if concerned for non-cardiac pathology.   IMPRESSION: No evidence of late gadolinium enhancement-suggestive of viable myocardium.   Small LV thrombus.  ECHO: 08/03/2021  1. Left ventricular ejection fraction, by estimation, is 30 to 35%. The  left ventricle has moderately decreased function. The left ventricle  demonstrates regional wall motion abnormalities (see scoring  diagram/findings for description). There is  moderate asymmetric left ventricular hypertrophy of the basal-septal  segment. Left ventricular diastolic parameters are consistent with Grade I  diastolic dysfunction (impaired relaxation).   2. LV apical mural thrombus   3. Right ventricular systolic function is normal. The right ventricular  size is normal. Tricuspid regurgitation signal is inadequate for assessing  PA pressure.   4. The mitral valve is degenerative. Trivial mitral valve regurgitation.  No evidence of mitral  stenosis. Moderate mitral annular calcification.   5. The aortic valve was not well visualized. Aortic valve regurgitation  is  trivial. No aortic stenosis is present.   6. The inferior vena cava is normal in size with greater than 50%  respiratory variability, suggesting right atrial pressure of 3 mmHg.   FINDINGS   Left Ventricle: Left ventricular ejection fraction, by estimation, is 30  to 35%. The left ventricle has moderately decreased function. The left  ventricle demonstrates regional wall motion abnormalities. Definity  contrast agent was given IV to delineate  the left ventricular endocardial borders. The left ventricular internal  cavity size was normal in size. There is moderate asymmetric left  ventricular hypertrophy of the basal-septal segment. Left ventricular  diastolic parameters are consistent with Grade   I diastolic dysfunction (impaired relaxation).      LV Wall Scoring:  The mid and distal anterior septum, entire apex, and mid inferoseptal  segment  are akinetic. The mid inferolateral segment, mid anterolateral segment,  mid  anterior segment, and mid inferior segment are hypokinetic. The basal  anteroseptal segment, basal inferolateral segment, basal anterolateral  segment, basal anterior segment, basal inferior segment, and basal  inferoseptal segment are normal.  _____________   History of Present Illness     CAELEY Davis is a 76 y.o. 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   Hospital Course     Consultants: CT surgery  Her symptoms had started approximately 1 week ago.  Her EKG was abnormal and code STEMI was activated.  Her initial cath report showed multivessel disease without an obvious culprit and PCI was deferred pending CT surgery evaluation.  She was evaluated by CT surgery and Dr. Caffie Pinto felt that it was  not clear that her distal inferior wall was viable.  He requested a cardiac MRI.  He also felt that the surgery would be very high risk and she is likely to have a prolonged recovery.  The MRI showed no evidence of late gadolinium enhancement that was suggestive of viable myocardium.  She had a small LV thrombus, but anticoagulation was deferred until after PCI, if needed.  She Hollye Pritt be discharged on Eliquis 5 mg twice daily.  Her EF was noted to be 35%.  She was started on GDMT, including aspirin, Plavix, high-dose statin, carvedilol, Entresto, Farxiga, and spironolactone 3 times a week.   A long discussion was had with the patient and her son on 08/06/2021.  She has decided against surgery, and an initial trial of medication was preferred.  She was started on Plavix, she was on aspirin as well as high-dose statin and a beta-blocker.  She was started on losartan initially, but Shaquon Gropp be discharged on Entresto.  She Corynn Solberg also have Aldactone 3 times a week as well.  She had epigastric discomfort overnight that was relieved by sublingual nitroglycerin.  She has chronic issues with insomnia that is related to her chronic pain syndrome.  She also had some overnight shortness of breath that could have been an anginal equivalent.  Because of her ongoing symptoms, the case with discussed with the Heart Team and PCI was recommended.  She was taken back to the Cath Lab on 08/08/2021.  She had successful PCI of  her LAD, and tolerated the procedure well.  On 6/10 she was evaluated by Dr. Curt Bears and all data were reviewed.  Per Dr. Thompson Caul note, plan to treat the circumflex and RCA lesions with PCI only if she continues to have ongoing symptoms.  Because of her LV thrombus along with her PCI, she Sury Wentworth be on triple drug therapy for a month with aspirin, Plavix, and Eliquis.  After 30 days, she Lilliauna Van stop the aspirin.  This information was put into her medication list.  Because of the triple drug therapy, she is asked to  stop her home regimen of naproxen twice daily.  No further inpatient work-up is indicated and she is considered stable for discharge, to follow-up as an outpatient.   Did the patient have an acute coronary syndrome (MI, NSTEMI, STEMI, etc) this admission?:  Yes                               AHA/ACC Clinical Performance & Quality Measures: Aspirin prescribed? - Yes ADP Receptor Inhibitor (Plavix/Clopidogrel, Brilinta/Ticagrelor or Effient/Prasugrel) prescribed (includes medically managed patients)? - Yes Beta Blocker prescribed? - Yes High Intensity Statin (Lipitor 40-'80mg'$  or Crestor 20-'40mg'$ ) prescribed? - Yes EF assessed during THIS hospitalization? - Yes For EF <40%, was ACEI/ARB prescribed? - Yes For EF <40%, Aldosterone Antagonist (Spironolactone or Eplerenone) prescribed? - Yes Cardiac Rehab Phase II ordered (including medically managed patients)? - No -   physical limitations   _____________  Discharge Vitals Blood pressure 110/65, pulse 69, temperature (!) 97.4 F (36.3 C), temperature source Axillary, resp. rate 14, height '5\' 3"'$  (1.6 m), weight 63.3 kg, SpO2 100 %.  Filed Weights   08/06/21 0400 08/06/21 2144 08/07/21 2112  Weight: 63 kg 63.8 kg 63.3 kg    Labs & Radiologic Studies    CBC Recent Labs    08/09/21 0647  WBC 5.5  HGB 12.4  HCT 36.8  MCV 90.9  PLT 734   Basic Metabolic Panel Recent Labs    08/08/21 0711 08/09/21 0647  NA 136 138  K 4.2 4.1  CL 105 104  CO2 25 26  GLUCOSE 89 87  BUN 20 16  CREATININE 0.91 0.83  CALCIUM 8.8* 8.8*   Liver Function Tests Lab Results  Component Value Date   ALT 14 08/03/2021   AST 22 08/03/2021   ALKPHOS 49 08/03/2021   BILITOT 0.4 08/03/2021     High Sensitivity Troponin:   Recent Labs  Lab 08/02/21 1906 08/02/21 2135 08/03/21 0033  TROPONINIHS 53* 78* 139*    BNP    Component Value Date/Time   BNP 513.5 (H) 08/05/2021 0148    Hemoglobin A1C Lab Results  Component Value Date   HGBA1C  5.6 08/02/2021    Fasting Lipid Panel Lab Results  Component Value Date   CHOL 153 08/02/2021   HDL 48 08/02/2021   LDLCALC 80 08/02/2021   TRIG 124 08/02/2021   CHOLHDL 3.2 08/02/2021   _____________  CARDIAC CATHETERIZATION  Result Date: 08/08/2021   Mid LAD-1 lesion is 80% stenosed.   Mid LAD-2 lesion is 90% stenosed.   Post intervention, there is a 0% residual stenosis.   Post intervention, there is a 0% residual stenosis. Successful PCI of the proximal and mid LAD tandem stenoses using a 3.0 x 20 mm Synergy DES over the proximal lesion and a 2.25 x 32 mm Synergy DES over the distal lesions.  The procedure is guided by  intravascular ultrasound. Recommendations: Dual antiplatelet therapy with aspirin and clopidogrel x12 months without interruption.  Aggressive risk reduction measures and anti-ischemic therapy for residual ischemic heart disease.   MR CARDIAC MORPHOLOGY W WO CONTRAST  Addendum Date: 08/05/2021   ADDENDUM REPORT: 08/05/2021 10:33 ADDENDUM: Reviewed CMR with Primary MD. Though there is some evidence of patchy LGE at the basal septum in the PSAX view, there is no transmural LGE in the mid or apex. LAD territory shows evidence of viability. Electronically Signed   By: Rudean Haskell M.D.   On: 08/05/2021 10:33   Result Date: 08/05/2021 CLINICAL DATA:  Clinical question of cardiac viability Study assumes HCT of 42 and BSA of 1.74 EXAM: CARDIAC MRI TECHNIQUE: The patient was scanned on a 1.5 Tesla GE magnet. A dedicated cardiac coil was used. Functional imaging was done using Fiesta sequences. 2,3, and 4 chamber views were done to assess for RWMA's. Modified Simpson's rule using a short axis stack was used to calculate an ejection fraction on a dedicated work Conservation officer, nature. The patient received 10 cc of Gadavist. After 10 minutes inversion recovery sequences were used to assess for infiltration and scar tissue. CONTRAST:  10 cc  of Gadavist FINDINGS: 1. Normal  left ventricular size, with LVEDD 39 mm, and LVEDVi 60 mL/m2. Moderate asymmetric septal hypertrophy, with intraventricular septal thickness of 14 mm, posterior wall thickness of 8 mm, and myocardial mass index mildly increased at 66 g/m2. Normal left ventricular systolic function (LVEF =26%). Global hypokinesis with hypokinesis in the basal, anteroseptal and severe hypokinesis mid and apex. Notably, mid inferior, inferoseptal and apical inferior are akinetic. There is a small LV apical thrombus. There is no late gadolinium enhancement in the left ventricular myocardium. 2. Normal right ventricular size with RVEDVI 29 mL/m2. Normal right ventricular thickness. Normal right ventricular systolic function (RVEF =83%). There are no regional wall motion abnormalities or aneurysms. 3.  Normal left and right atrial size. 4.  Normal size of the pulmonary artery. Ascending aorta is at the upper limit of normal for age and BSA, 39 mm. 5. Valve assessment: Aortic Valve: Not well visualized. Qualitatively, there is no significant regurgitation. Pulmonic Valve: Qualitatively, there is no significant regurgitation. Tricuspid Valve: Qualitatively, there is no significant regurgitation. Mitral Valve: Abnormally thickened with posterior leaflet restriction. There is no significant regurgitation. 6. Normal pericardium. No pericardial effusion. Prominent pericardial fat. 7. Grossly, no additional extracardiac findings, outside of bilateral pleural effusions. Recommended dedicated study if concerned for non-cardiac pathology. IMPRESSION: No evidence of late gadolinium enhancement-suggestive of viable myocardium. Small LV thrombus. Rudean Haskell MD Electronically Signed: By: Rudean Haskell M.D. On: 08/04/2021 13:50   ECHOCARDIOGRAM COMPLETE  Result Date: 08/03/2021    ECHOCARDIOGRAM REPORT   Patient Name:   BRICIA TAHER Date of Exam: 08/03/2021 Medical Rec #:  [id number]       Height:       63.0 in Accession #:     [id number]      Weight:       150.0 lb Date of Birth:  Sep 01, 1945        BSA:          48.711 m Patient Age:    62 years        BP:           120/66 mmHg Patient Gender: F               HR:  70 bpm. Exam Location:  Inpatient Procedure: 2D Echo, 3D Echo, Cardiac Doppler, Color Doppler and Intracardiac            Opacification Agent Indications:    122-I22.9 Subsequent ST elevation (STEM) and non-ST elevation                 (NSTEMI) myocardial infarction  History:        Patient has no prior history of Echocardiogram examinations.                 Acute MI and CAD, Signs/Symptoms:Chest Pain and Altered Mental                 Status; Risk Factors:Current Smoker and Dyslipidemia.  Sonographer:    Roseanna Rainbow RDCS Referring Phys: [place]  1. Left ventricular ejection fraction, by estimation, is 30 to 35%. The left ventricle has moderately decreased function. The left ventricle demonstrates regional wall motion abnormalities (see scoring diagram/findings for description). There is moderate asymmetric left ventricular hypertrophy of the basal-septal segment. Left ventricular diastolic parameters are consistent with Grade I diastolic dysfunction (impaired relaxation).  2. LV apical mural thrombus  3. Right ventricular systolic function is normal. The right ventricular size is normal. Tricuspid regurgitation signal is inadequate for assessing PA pressure.  4. The mitral valve is degenerative. Trivial mitral valve regurgitation. No evidence of mitral stenosis. Moderate mitral annular calcification.  5. The aortic valve was not well visualized. Aortic valve regurgitation is trivial. No aortic stenosis is present.  6. The inferior vena cava is normal in size with greater than 50% respiratory variability, suggesting right atrial pressure of 3 mmHg. FINDINGS  Left Ventricle: Left ventricular ejection fraction, by estimation, is 30 to 35%. The left ventricle has moderately decreased function. The left  ventricle demonstrates regional wall motion abnormalities. Definity contrast agent was given IV to delineate the left ventricular endocardial borders. The left ventricular internal cavity size was normal in size. There is moderate asymmetric left ventricular hypertrophy of the basal-septal segment. Left ventricular diastolic parameters are consistent with Grade  I diastolic dysfunction (impaired relaxation).  LV Wall Scoring: The mid and distal anterior septum, entire apex, and mid inferoseptal segment are akinetic. The mid inferolateral segment, mid anterolateral segment, mid anterior segment, and mid inferior segment are hypokinetic. The basal anteroseptal segment, basal inferolateral segment, basal anterolateral segment, basal anterior segment, basal inferior segment, and basal inferoseptal segment are normal. Right Ventricle: The right ventricular size is normal. No increase in right ventricular wall thickness. Right ventricular systolic function is normal. Tricuspid regurgitation signal is inadequate for assessing PA pressure. Left Atrium: Left atrial size was normal in size. Right Atrium: Right atrial size was normal in size. Pericardium: There is no evidence of pericardial effusion. Mitral Valve: The mitral valve is degenerative in appearance. Moderate mitral annular calcification. Trivial mitral valve regurgitation. No evidence of mitral valve stenosis. Tricuspid Valve: The tricuspid valve is normal in structure. Tricuspid valve regurgitation is trivial. Aortic Valve: The aortic valve was not well visualized. Aortic valve regurgitation is trivial. No aortic stenosis is present. Pulmonic Valve: The pulmonic valve was not well visualized. Pulmonic valve regurgitation is not visualized. Aorta: The aortic root and ascending aorta are structurally normal, with no evidence of dilitation. Venous: The inferior vena cava is normal in size with greater than 50% respiratory variability, suggesting right atrial pressure  of 3 mmHg. IAS/Shunts: The interatrial septum was not well visualized.  LEFT VENTRICLE PLAX 2D LVIDd:  5.00 cm     Diastology LVIDs:         4.10 cm     LV e' medial:    3.23 cm/s LV PW:         1.10 cm     LV E/e' medial:  18.0 LV IVS:        0.80 cm     LV e' lateral:   6.23 cm/s LVOT diam:     1.80 cm     LV E/e' lateral: 9.3 LV SV:         49 LV SV Index:   28 LVOT Area:     2.54 cm                             3D Volume EF: LV Volumes (MOD)           3D EF:        34 % LV vol d, MOD A2C: 81.9 ml LV EDV:       92 ml LV vol d, MOD A4C: 90.8 ml LV ESV:       61 ml LV vol s, MOD A2C: 67.6 ml LV SV:        31 ml LV vol s, MOD A4C: 65.2 ml LV SV MOD A2C:     14.3 ml LV SV MOD A4C:     90.8 ml LV SV MOD BP:      18.6 ml RIGHT VENTRICLE             IVC RV S prime:     10.60 cm/s  IVC diam: 1.50 cm TAPSE (M-mode): 1.7 cm LEFT ATRIUM             Index        RIGHT ATRIUM          Index LA Vol (A2C):   31.2 ml 18.23 ml/m  RA Area:     9.36 cm LA Vol (A4C):   39.1 ml 22.85 ml/m  RA Volume:   18.90 ml 11.05 ml/m LA Biplane Vol: 35.5 ml 20.75 ml/m  AORTIC VALVE LVOT Vmax:   96.40 cm/s LVOT Vmean:  61.300 cm/s LVOT VTI:    0.191 m  AORTA Ao Root diam: 3.10 cm Ao Asc diam:  3.50 cm MITRAL VALVE MV Area (PHT): 3.53 cm    SHUNTS MV Decel Time: 215 msec    Systemic VTI:  0.19 m MV E velocity: 58.20 cm/s  Systemic Diam: 1.80 cm MV A velocity: 96.40 cm/s MV E/A ratio:  0.60 Oswaldo Milian MD Electronically signed by Oswaldo Milian MD Signature Date/Time: 08/03/2021/1:23:48 PM    Final    DG CHEST PORT 1 VIEW  Result Date: 08/03/2021 CLINICAL DATA:  Evaluate atelectasis EXAM: PORTABLE CHEST 1 VIEW COMPARISON:  Prior chest x-ray yesterday 08/02/2021 FINDINGS: External defibrillator pads project over the left chest. Cardiac and mediastinal contours are stable. Atherosclerotic calcifications in the transverse aorta. Significantly improved pulmonary edema. Probable trace bilateral pleural effusions and  associated atelectasis. The amount of atelectasis has improved. No pneumothorax. No acute osseous abnormality. IMPRESSION: 1. Interval resolution of pulmonary edema with significant improvement in aeration. 2. Probable trace bilateral pleural effusions and associated bibasilar atelectasis. Electronically Signed   By: Jacqulynn Cadet M.D.   On: 08/03/2021 07:24   DG CHEST PORT 1 VIEW  Result Date: 08/02/2021 CLINICAL DATA:  Pulmonary edema. EXAM: PORTABLE CHEST 1 VIEW COMPARISON:  August 02, 2021 (7:11 p.m.) FINDINGS: Low lung volumes are seen with diffusely increased interstitial lung markings. Mild atelectasis and/or infiltrate is also seen within the bilateral lung bases. This is mildly increased in severity when compared to the prior study. No pleural effusion or pneumothorax is identified. The cardiac silhouette is within the upper limits of normal and stable in size. A radiopaque fusion plate and screws are seen overlying the lower cervical spine. Multilevel degenerative changes are seen throughout the thoracic spine. IMPRESSION: 1. Low lung volumes with mild, stable interstitial edema. 2. Mild bibasilar atelectasis, mildly increased in severity when compared to the prior study. Electronically Signed   By: Virgina Norfolk M.D.   On: 08/02/2021 22:55   CARDIAC CATHETERIZATION  Result Date: 08/02/2021 Conclusions: Severe three-vessel coronary artery disease including sequential 80-90% proximal and mid LAD stenoses, 70% proximal ramus intermedius lesion, multifocal LCx disease of up to 90% in the mid/distal vessel, and diffusely diseased proximal/mid RCA with up to 50% stenosis as well as sequential 90% ostial and 70% mid RPDA lesions. Moderately-severely elevated left ventricular filling pressure (LVEDP 30-35 mmHg).  LVEF suboptimally assessed but is likely mildly-moderately reduced with anterior hypokinesis. Recommendations: Case discussed with Dr. Cyndia Bent (cardiac surgery).  There is not a clear culprit  for the patient's EKG changes.  I suspect that she may have had an ischemic event several days ago with her transient chest pain and is now manifesting predominantly heart failure symptoms.  We Ojas Coone optimize her heart failure while discussing optimal revascularization strategy. Patient given furosemide 40 mg IV at the end of the procedure.  Continue diuresis as blood pressure and renal function tolerate. Hold home doses of metoprolol and lisinopril for now.  Reinstitute goal-directed medical therapy as tolerated based on blood pressure, renal function, and heart failure. Obtain echocardiogram. Restart IV heparin 2 hours after TR band removal.  Defer adding P2Y12 inhibitor pending cardiac surgery consultation. Aggressive secondary prevention of coronary artery disease. Nelva Bush, MD [facility]  DG Chest Port 1 View  Result Date: 08/02/2021 CLINICAL DATA:  Shortness of breath beginning 3 nights ago. EXAM: PORTABLE CHEST 1 VIEW COMPARISON:  02/12/2021 FINDINGS: Artifact overlies the chest. Heart size upper limits of normal. Aortic atherosclerosis is present. There is mild interstitial edema and small pleural effusions, suggesting congestive heart failure. Volume loss at the lung bases secondary to the effusions. No acute bone finding. IMPRESSION: Interstitial edema. Bilateral pleural effusions. Basilar volume loss associated with the fusions. Findings consistent with congestive heart failure. Electronically Signed   By: Nelson Chimes M.D.   On: 08/02/2021 19:18    Disposition   Pt is being discharged home today in improved condition.  Follow-up Plans & Appointments     Follow-up [facility] up.   Specialty: Cardiology Why: The office Gervis Gaba call you with an appointment. Contact information: [place] [phone]               Discharge Instructions     AMB Referral to Cardiac  Rehabilitation - Phase II   Complete by: As directed    Diagnosis: NSTEMI   After initial evaluation and assessments completed: Virtual Based Care may be provided alone or in conjunction with Phase 2 Cardiac Rehab based on patient barriers.: Yes   Amb Referral to Cardiac Rehabilitation   Complete by: As directed    Diagnosis: STEMI   After initial evaluation and assessments completed:  Virtual Based Care may be provided alone or in conjunction with Phase 2 Cardiac Rehab based on patient barriers.: Yes   Diet - low sodium heart healthy   Complete by: As directed    Increase activity slowly   Complete by: As directed        Discharge Medications   Allergies as of 08/09/2021       Reactions   Morphine Rash        Medication List     STOP taking these medications    naproxen 500 MG tablet Commonly known as: Naprosyn   simvastatin 40 MG tablet Commonly known as: ZOCOR       TAKE these medications    ALPRAZolam 0.5 MG tablet Commonly known as: XANAX Take 0.5 mg by mouth 2 (two) times daily as needed.   apixaban 5 MG Tabs tablet Commonly known as: ELIQUIS Take 1 tablet (5 mg total) by mouth 2 (two) times daily.   aspirin EC 81 MG tablet Take 1 tablet (81 mg total) by mouth daily. Swallow whole. Notes to patient: Take for 30 days, along with the Eliquis/apixaban and clopidogrel/Plavix Then stop the aspirin, and continue taking the other 2 medications   atorvastatin 80 MG tablet Commonly known as: LIPITOR Take 1 tablet (80 mg total) by mouth at bedtime.   bisacodyl 10 MG suppository Commonly known as: DULCOLAX Place 1 suppository (10 mg total) rectally daily as needed for moderate constipation.   carvedilol 3.125 MG tablet Commonly known as: COREG Take 1 tablet (3.125 mg total) by mouth 2 (two) times daily with a meal.   chlorthalidone 25 MG tablet Commonly known as: HYGROTON Take 12.5 mg by mouth every other day.   clopidogrel 75 MG tablet Commonly known  as: PLAVIX Take 1 tablet (75 mg total) by mouth daily. Start taking on: August 10, 2021   cyanocobalamin 1000 MCG/ML injection Commonly known as: (VITAMIN B-12) Inject 1 mcg into the muscle every 30 (thirty) days.   dapagliflozin propanediol 10 MG Tabs tablet Commonly known as: FARXIGA Take 1 tablet (10 mg total) by mouth daily. Start taking on: August 10, 2021   diclofenac Sodium 1 % Gel Commonly known as: VOLTAREN Apply 2 g topically 4 (four) times daily as needed (pain).   DULoxetine 30 MG capsule Commonly known as: CYMBALTA Take 30 mg by mouth 3 (three) times daily.   Entresto 24-26 MG Generic drug: sacubitril-valsartan Take 1 tablet by mouth 2 (two) times daily.   fluticasone 50 MCG/ACT nasal spray Commonly known as: FLONASE Place 2 sprays into both nostrils daily as needed for allergies.   gabapentin 800 MG tablet Commonly known as: NEURONTIN Take 800 mg by mouth 3 (three) times daily.   melatonin 5 MG Tabs Take 1 tablet (5 mg total) by mouth at bedtime as needed.   metoprolol succinate 100 MG 24 hr tablet Commonly known as: TOPROL-XL Take 100 mg by mouth daily.   nitroGLYCERIN 0.4 MG SL tablet Commonly known as: NITROSTAT Place 1 tablet (0.4 mg total) under the tongue every 5 (five) minutes x 3 doses as needed for chest pain.   oxycodone 30 MG immediate release tablet Commonly known as: ROXICODONE Take 30 mg by mouth 3 (three) times daily as needed for pain.   pantoprazole 40 MG tablet Commonly known as: PROTONIX Take 40 mg by mouth daily.   spironolactone 25 MG tablet Commonly known as: Aldactone Take 0.5 tablets (12.5 mg total) by mouth 3 (three) times a week. Start taking  on: August 10, 2021           Outstanding Labs/Studies   None  Duration of Discharge Encounter   Greater than 30 minutes including physician time.  Signed, Rosaria Ferries, PA-C 08/09/2021, 1:39 PM    I have seen and examined this patient with Rosaria Ferries.  Agree with  above, note added to reflect my findings.  Feeling well without chest pain. Presented with chest pain for one week. Found to have significant CAD. Seen by surgery but thought was not viable. Post cath with LAD stent. Discharge today with follow up in clinic on HFmeds and aspirin/plavix. Had LV thrombus now on eliquis as well.  GEN: Well nourished, well developed, in no acute distress  HEENT: normal  Neck: no JVD, carotid bruits, or masses Cardiac: RRR; no murmurs, rubs, or gallops,no edema  Respiratory:  clear to auscultation bilaterally, normal work of breathing GI: soft, nontender, nondistended, + BS MS: no deformity or atrophy  Skin: warm and dry Neuro:  Strength and sensation are intact Psych: euthymic mood, full affect    Dilcia Rybarczyk M. Radames Mejorado MD 08/09/2021 2:52 PM

## 2021-08-09 NOTE — Progress Notes (Signed)
Note to rounding team: Plan is to ambulate and discharge, today or tomorrow. Started or low dose ARB this AM. Plan to treat the CFX and RCA with PCI only if symptom driven.

## 2021-08-09 NOTE — Progress Notes (Signed)
CARDIAC REHAB PHASE I   PRE:  Rate/Rhythm: 73 SR    BP: sitting 110/65    SaO2: 100 Ra  MODE:  Ambulation: 450 ft   POST:  Rate/Rhythm: 107 ST    BP: sitting 110/90     SaO2: 100 RA  [id number] Patient ambulated in hallway x 1 assist. Unsteady gait noted. Patient c/o left leg and right ankle pain from old injury years ago. Has RW at home and encouraged patient to use until she regains strength. States her ankle flares when she has not been ambulatory for a while. Stent education completed. Patient verbalizes understanding. Anticipate d/c home today. Additional education previously completed yesterday and referral placed to phase 2 CR.  Genessis Flanary Minus Breeding RN, BSN

## 2021-08-11 ENCOUNTER — Encounter (HOSPITAL_COMMUNITY): Payer: Self-pay | Admitting: Cardiovascular Disease

## 2021-08-19 ENCOUNTER — Other Ambulatory Visit: Payer: Self-pay

## 2021-08-25 ENCOUNTER — Inpatient Hospital Stay (HOSPITAL_COMMUNITY)
Admission: EM | Admit: 2021-08-25 | Discharge: 2021-08-28 | DRG: 315 | Disposition: A | Discharge status: Home / Self Care | Payer: Medicare Other | Attending: Internal Medicine | Admitting: Internal Medicine

## 2021-08-25 ENCOUNTER — Other Ambulatory Visit: Payer: Self-pay

## 2021-08-25 ENCOUNTER — Observation Stay (HOSPITAL_COMMUNITY): Payer: Medicare Other

## 2021-08-25 DIAGNOSIS — Z79899 Other long term (current) drug therapy: Secondary | ICD-10-CM

## 2021-08-25 DIAGNOSIS — E872 Acidosis, unspecified: Secondary | ICD-10-CM | POA: Diagnosis present

## 2021-08-25 DIAGNOSIS — I200 Unstable angina: Secondary | ICD-10-CM | POA: Diagnosis not present

## 2021-08-25 DIAGNOSIS — E8809 Other disorders of plasma-protein metabolism, not elsewhere classified: Secondary | ICD-10-CM | POA: Diagnosis present

## 2021-08-25 DIAGNOSIS — E861 Hypovolemia: Secondary | ICD-10-CM | POA: Diagnosis present

## 2021-08-25 DIAGNOSIS — E860 Dehydration: Secondary | ICD-10-CM | POA: Diagnosis present

## 2021-08-25 DIAGNOSIS — I25110 Atherosclerotic heart disease of native coronary artery with unstable angina pectoris: Secondary | ICD-10-CM | POA: Diagnosis present

## 2021-08-25 DIAGNOSIS — Z7982 Long term (current) use of aspirin: Secondary | ICD-10-CM

## 2021-08-25 DIAGNOSIS — R001 Bradycardia, unspecified: Secondary | ICD-10-CM | POA: Diagnosis present

## 2021-08-25 DIAGNOSIS — I110 Hypertensive heart disease with heart failure: Secondary | ICD-10-CM | POA: Diagnosis present

## 2021-08-25 DIAGNOSIS — Z7901 Long term (current) use of anticoagulants: Secondary | ICD-10-CM

## 2021-08-25 DIAGNOSIS — I25810 Atherosclerosis of coronary artery bypass graft(s) without angina pectoris: Secondary | ICD-10-CM | POA: Diagnosis present

## 2021-08-25 DIAGNOSIS — F419 Anxiety disorder, unspecified: Secondary | ICD-10-CM | POA: Diagnosis present

## 2021-08-25 DIAGNOSIS — Z7984 Long term (current) use of oral hypoglycemic drugs: Secondary | ICD-10-CM

## 2021-08-25 DIAGNOSIS — R079 Chest pain, unspecified: Secondary | ICD-10-CM | POA: Diagnosis present

## 2021-08-25 DIAGNOSIS — I959 Hypotension, unspecified: Principal | ICD-10-CM

## 2021-08-25 DIAGNOSIS — I429 Cardiomyopathy, unspecified: Secondary | ICD-10-CM | POA: Diagnosis present

## 2021-08-25 DIAGNOSIS — E785 Hyperlipidemia, unspecified: Secondary | ICD-10-CM | POA: Diagnosis present

## 2021-08-25 DIAGNOSIS — F17200 Nicotine dependence, unspecified, uncomplicated: Secondary | ICD-10-CM

## 2021-08-25 DIAGNOSIS — Z7902 Long term (current) use of antithrombotics/antiplatelets: Secondary | ICD-10-CM

## 2021-08-25 DIAGNOSIS — D649 Anemia, unspecified: Secondary | ICD-10-CM | POA: Diagnosis present

## 2021-08-25 DIAGNOSIS — I10 Essential (primary) hypertension: Secondary | ICD-10-CM | POA: Diagnosis present

## 2021-08-25 DIAGNOSIS — K219 Gastro-esophageal reflux disease without esophagitis: Secondary | ICD-10-CM | POA: Diagnosis present

## 2021-08-25 DIAGNOSIS — E876 Hypokalemia: Secondary | ICD-10-CM | POA: Diagnosis present

## 2021-08-25 DIAGNOSIS — Z66 Do not resuscitate: Secondary | ICD-10-CM | POA: Diagnosis present

## 2021-08-25 DIAGNOSIS — I5022 Chronic systolic (congestive) heart failure: Secondary | ICD-10-CM

## 2021-08-25 DIAGNOSIS — I252 Old myocardial infarction: Secondary | ICD-10-CM

## 2021-08-25 DIAGNOSIS — Z955 Presence of coronary angioplasty implant and graft: Secondary | ICD-10-CM

## 2021-08-25 DIAGNOSIS — Z885 Allergy status to narcotic agent status: Secondary | ICD-10-CM

## 2021-08-25 DIAGNOSIS — F17210 Nicotine dependence, cigarettes, uncomplicated: Secondary | ICD-10-CM | POA: Diagnosis present

## 2021-08-25 DIAGNOSIS — Z8249 Family history of ischemic heart disease and other diseases of the circulatory system: Secondary | ICD-10-CM

## 2021-08-25 DIAGNOSIS — R7303 Prediabetes: Secondary | ICD-10-CM | POA: Diagnosis present

## 2021-08-25 DIAGNOSIS — N179 Acute kidney failure, unspecified: Secondary | ICD-10-CM

## 2021-08-25 DIAGNOSIS — I2510 Atherosclerotic heart disease of native coronary artery without angina pectoris: Secondary | ICD-10-CM | POA: Diagnosis present

## 2021-08-25 DIAGNOSIS — Z8572 Personal history of non-Hodgkin lymphomas: Secondary | ICD-10-CM

## 2021-08-25 DIAGNOSIS — F32A Depression, unspecified: Secondary | ICD-10-CM | POA: Diagnosis present

## 2021-08-25 HISTORY — DX: Intracardiac thrombosis, not elsewhere classified: I51.3

## 2021-08-25 HISTORY — DX: Heart failure, unspecified: I50.9

## 2021-08-25 HISTORY — DX: Atherosclerotic heart disease of native coronary artery without angina pectoris: I25.10

## 2021-08-25 LAB — CBC
HCT: 36.1 % (ref 36.0–46.0)
Hemoglobin: 11.6 g/dL — ABNORMAL LOW (ref 12.0–15.0)
MCH: 30.4 pg (ref 26.0–34.0)
MCHC: 32.1 g/dL (ref 30.0–36.0)
MCV: 94.8 fL (ref 80.0–100.0)
Platelets: 178 10*3/uL (ref 150–400)
RBC: 3.81 MIL/uL — ABNORMAL LOW (ref 3.87–5.11)
RDW: 13.9 % (ref 11.5–15.5)
WBC: 5.5 10*3/uL (ref 4.0–10.5)
nRBC: 0 % (ref 0.0–0.2)

## 2021-08-25 LAB — BASIC METABOLIC PANEL
Anion gap: 9 (ref 5–15)
BUN: 33 mg/dL — ABNORMAL HIGH (ref 8–23)
CO2: 28 mmol/L (ref 22–32)
Calcium: 8.9 mg/dL (ref 8.9–10.3)
Chloride: 99 mmol/L (ref 98–111)
Creatinine, Ser: 1.62 mg/dL — ABNORMAL HIGH (ref 0.44–1.00)
GFR, Estimated: 33 mL/min — ABNORMAL LOW (ref >60)
Glucose, Bld: 187 mg/dL — ABNORMAL HIGH (ref 70–99)
Potassium: 3.7 mmol/L (ref 3.5–5.1)
Sodium: 136 mmol/L (ref 135–145)

## 2021-08-25 LAB — TROPONIN I (HIGH SENSITIVITY)
Troponin I (High Sensitivity): 13 ng/L (ref <18)
Troponin I (High Sensitivity): 12 ng/L (ref <18)

## 2021-08-25 MED ORDER — ASPIRIN 81 MG PO CHEW
324.0000 mg | CHEWABLE_TABLET | Freq: Once | ORAL | Status: AC
  Administered 2021-08-25: 324 mg via ORAL
  Filled 2021-08-25: qty 4

## 2021-08-25 NOTE — ED Triage Notes (Signed)
Patient coming to ED for evaluation of chest pain.  Onset around 10 AM this morning.  Pain having back pain and having discomfort in "my throat.  It feels like it is just sitting there."  No reports of SHOB.  Had cardiac stents place in the beginning on June.

## 2021-08-26 ENCOUNTER — Inpatient Hospital Stay (HOSPITAL_COMMUNITY): Payer: Medicare Other

## 2021-08-26 ENCOUNTER — Encounter (HOSPITAL_COMMUNITY): Payer: Self-pay | Admitting: Family Medicine

## 2021-08-26 DIAGNOSIS — I25810 Atherosclerosis of coronary artery bypass graft(s) without angina pectoris: Secondary | ICD-10-CM

## 2021-08-26 DIAGNOSIS — I959 Hypotension, unspecified: Principal | ICD-10-CM

## 2021-08-26 DIAGNOSIS — Z66 Do not resuscitate: Secondary | ICD-10-CM | POA: Diagnosis present

## 2021-08-26 DIAGNOSIS — E872 Acidosis, unspecified: Secondary | ICD-10-CM | POA: Diagnosis present

## 2021-08-26 DIAGNOSIS — R079 Chest pain, unspecified: Secondary | ICD-10-CM

## 2021-08-26 DIAGNOSIS — E860 Dehydration: Secondary | ICD-10-CM | POA: Diagnosis present

## 2021-08-26 DIAGNOSIS — R7303 Prediabetes: Secondary | ICD-10-CM | POA: Diagnosis present

## 2021-08-26 DIAGNOSIS — Z955 Presence of coronary angioplasty implant and graft: Secondary | ICD-10-CM | POA: Diagnosis not present

## 2021-08-26 DIAGNOSIS — E8809 Other disorders of plasma-protein metabolism, not elsewhere classified: Secondary | ICD-10-CM | POA: Diagnosis present

## 2021-08-26 DIAGNOSIS — I25110 Atherosclerotic heart disease of native coronary artery with unstable angina pectoris: Secondary | ICD-10-CM | POA: Diagnosis present

## 2021-08-26 DIAGNOSIS — Z885 Allergy status to narcotic agent status: Secondary | ICD-10-CM | POA: Diagnosis not present

## 2021-08-26 DIAGNOSIS — I252 Old myocardial infarction: Secondary | ICD-10-CM | POA: Diagnosis not present

## 2021-08-26 DIAGNOSIS — Z79899 Other long term (current) drug therapy: Secondary | ICD-10-CM | POA: Diagnosis not present

## 2021-08-26 DIAGNOSIS — I5022 Chronic systolic (congestive) heart failure: Secondary | ICD-10-CM | POA: Diagnosis present

## 2021-08-26 DIAGNOSIS — F32A Depression, unspecified: Secondary | ICD-10-CM | POA: Diagnosis present

## 2021-08-26 DIAGNOSIS — E785 Hyperlipidemia, unspecified: Secondary | ICD-10-CM | POA: Diagnosis present

## 2021-08-26 DIAGNOSIS — I10 Essential (primary) hypertension: Secondary | ICD-10-CM

## 2021-08-26 DIAGNOSIS — D649 Anemia, unspecified: Secondary | ICD-10-CM | POA: Diagnosis present

## 2021-08-26 DIAGNOSIS — I429 Cardiomyopathy, unspecified: Secondary | ICD-10-CM | POA: Diagnosis present

## 2021-08-26 DIAGNOSIS — Z7901 Long term (current) use of anticoagulants: Secondary | ICD-10-CM | POA: Diagnosis not present

## 2021-08-26 DIAGNOSIS — N179 Acute kidney failure, unspecified: Secondary | ICD-10-CM | POA: Diagnosis present

## 2021-08-26 DIAGNOSIS — I200 Unstable angina: Secondary | ICD-10-CM | POA: Diagnosis present

## 2021-08-26 DIAGNOSIS — Z8572 Personal history of non-Hodgkin lymphomas: Secondary | ICD-10-CM | POA: Diagnosis not present

## 2021-08-26 DIAGNOSIS — F17210 Nicotine dependence, cigarettes, uncomplicated: Secondary | ICD-10-CM | POA: Diagnosis present

## 2021-08-26 DIAGNOSIS — F419 Anxiety disorder, unspecified: Secondary | ICD-10-CM | POA: Diagnosis present

## 2021-08-26 DIAGNOSIS — K219 Gastro-esophageal reflux disease without esophagitis: Secondary | ICD-10-CM | POA: Diagnosis present

## 2021-08-26 DIAGNOSIS — I110 Hypertensive heart disease with heart failure: Secondary | ICD-10-CM | POA: Diagnosis present

## 2021-08-26 DIAGNOSIS — I2510 Atherosclerotic heart disease of native coronary artery without angina pectoris: Secondary | ICD-10-CM | POA: Diagnosis present

## 2021-08-26 LAB — HEMOGLOBIN AND HEMATOCRIT, BLOOD
HCT: 31.4 % — ABNORMAL LOW (ref 36.0–46.0)
Hemoglobin: 10.1 g/dL — ABNORMAL LOW (ref 12.0–15.0)

## 2021-08-26 LAB — RETICULOCYTES
Immature Retic Fract: 9.1 % (ref 2.3–15.9)
RBC.: 3.25 MIL/uL — ABNORMAL LOW (ref 3.87–5.11)
Retic Count, Absolute: 48.1 10*3/uL (ref 19.0–186.0)
Retic Ct Pct: 1.5 % (ref 0.4–3.1)

## 2021-08-26 LAB — COMPREHENSIVE METABOLIC PANEL
ALT: 13 U/L (ref 0–44)
AST: 18 U/L (ref 15–41)
Albumin: 3.3 g/dL — ABNORMAL LOW (ref 3.5–5.0)
Alkaline Phosphatase: 38 U/L (ref 38–126)
Anion gap: 8 (ref 5–15)
BUN: 32 mg/dL — ABNORMAL HIGH (ref 8–23)
CO2: 28 mmol/L (ref 22–32)
Calcium: 8.8 mg/dL — ABNORMAL LOW (ref 8.9–10.3)
Chloride: 103 mmol/L (ref 98–111)
Creatinine, Ser: 1.2 mg/dL — ABNORMAL HIGH (ref 0.44–1.00)
GFR, Estimated: 47 mL/min — ABNORMAL LOW (ref >60)
Glucose, Bld: 94 mg/dL (ref 70–99)
Potassium: 3.6 mmol/L (ref 3.5–5.1)
Sodium: 139 mmol/L (ref 135–145)
Total Bilirubin: 0.3 mg/dL (ref 0.3–1.2)
Total Protein: 5.7 g/dL — ABNORMAL LOW (ref 6.5–8.1)

## 2021-08-26 LAB — LACTIC ACID, PLASMA
Lactic Acid, Venous: 2 mmol/L (ref 0.5–1.9)
Lactic Acid, Venous: 1.3 mmol/L (ref 0.5–1.9)

## 2021-08-26 LAB — ECHOCARDIOGRAM LIMITED
Calc EF: 64.3 %
Height: 63 in
S' Lateral: 2.9 cm
Single Plane A2C EF: 65.4 %
Single Plane A4C EF: 63.2 %
Weight: 2275.15 oz

## 2021-08-26 LAB — CBC
HCT: 32.5 % — ABNORMAL LOW (ref 36.0–46.0)
Hemoglobin: 10.6 g/dL — ABNORMAL LOW (ref 12.0–15.0)
MCH: 30.4 pg (ref 26.0–34.0)
MCHC: 32.6 g/dL (ref 30.0–36.0)
MCV: 93.1 fL (ref 80.0–100.0)
Platelets: 163 10*3/uL (ref 150–400)
RBC: 3.49 MIL/uL — ABNORMAL LOW (ref 3.87–5.11)
RDW: 14.2 % (ref 11.5–15.5)
WBC: 4.3 10*3/uL (ref 4.0–10.5)
nRBC: 0 % (ref 0.0–0.2)

## 2021-08-26 LAB — MRSA NEXT GEN BY PCR, NASAL: MRSA by PCR Next Gen: NOT DETECTED

## 2021-08-26 LAB — IRON AND TIBC
Iron: 66 ug/dL (ref 28–170)
Saturation Ratios: 24 % (ref 10.4–31.8)
TIBC: 273 ug/dL (ref 250–450)
UIBC: 207 ug/dL

## 2021-08-26 LAB — VITAMIN B12: Vitamin B-12: 253 pg/mL (ref 180–914)

## 2021-08-26 LAB — MAGNESIUM: Magnesium: 2.1 mg/dL (ref 1.7–2.4)

## 2021-08-26 LAB — PROCALCITONIN: Procalcitonin: <0.1 ng/mL

## 2021-08-26 LAB — FERRITIN: Ferritin: 29 ng/mL (ref 11–307)

## 2021-08-26 LAB — FOLATE: Folate: 10 ng/mL (ref >5.9)

## 2021-08-26 LAB — TROPONIN I (HIGH SENSITIVITY): Troponin I (High Sensitivity): 12 ng/L (ref <18)

## 2021-08-26 MED ORDER — MELATONIN 3 MG PO TABS
6.0000 mg | ORAL_TABLET | Freq: Every day | ORAL | Status: DC
  Administered 2021-08-26 – 2021-08-27 (×3): 6 mg via ORAL
  Filled 2021-08-26 (×3): qty 2

## 2021-08-26 MED ORDER — CLOPIDOGREL BISULFATE 75 MG PO TABS
75.0000 mg | ORAL_TABLET | Freq: Every day | ORAL | Status: DC
  Administered 2021-08-26 – 2021-08-28 (×3): 75 mg via ORAL
  Filled 2021-08-26 (×3): qty 1

## 2021-08-26 MED ORDER — SODIUM CHLORIDE 0.9 % IV BOLUS
500.0000 mL | Freq: Once | INTRAVENOUS | Status: AC
  Administered 2021-08-26: 500 mL via INTRAVENOUS

## 2021-08-26 MED ORDER — ASPIRIN 81 MG PO TBEC
81.0000 mg | DELAYED_RELEASE_TABLET | Freq: Every day | ORAL | Status: DC
  Administered 2021-08-26 – 2021-08-28 (×3): 81 mg via ORAL
  Filled 2021-08-26 (×3): qty 1

## 2021-08-26 MED ORDER — ALPRAZOLAM 0.5 MG PO TABS
0.5000 mg | ORAL_TABLET | Freq: Two times a day (BID) | ORAL | Status: DC | PRN
  Administered 2021-08-26 – 2021-08-27 (×3): 0.5 mg via ORAL
  Filled 2021-08-26 (×3): qty 1

## 2021-08-26 MED ORDER — MIDODRINE HCL 5 MG PO TABS
10.0000 mg | ORAL_TABLET | Freq: Three times a day (TID) | ORAL | Status: DC
  Administered 2021-08-26 (×2): 10 mg via ORAL
  Filled 2021-08-26 (×2): qty 2

## 2021-08-26 MED ORDER — CARVEDILOL 3.125 MG PO TABS
3.1250 mg | ORAL_TABLET | Freq: Two times a day (BID) | ORAL | Status: DC
  Filled 2021-08-26: qty 1

## 2021-08-26 MED ORDER — APIXABAN 5 MG PO TABS
5.0000 mg | ORAL_TABLET | Freq: Two times a day (BID) | ORAL | Status: DC
Start: 2021-08-26 — End: 2021-08-28
  Administered 2021-08-26 – 2021-08-28 (×5): 5 mg via ORAL
  Filled 2021-08-26 (×5): qty 1

## 2021-08-26 MED ORDER — SODIUM CHLORIDE 0.9 % IV BOLUS
500.0000 mL | Freq: Once | INTRAVENOUS | Status: AC
Start: 2021-08-26 — End: 2021-08-26
  Administered 2021-08-26: 500 mL via INTRAVENOUS

## 2021-08-26 MED ORDER — SODIUM CHLORIDE 0.9 % IV BOLUS: 500.0000 mL | Freq: Once | INTRAVENOUS | Status: DC

## 2021-08-26 MED ORDER — ONDANSETRON HCL 4 MG/2ML IJ SOLN: 4.0000 mg | Freq: Four times a day (QID) | INTRAMUSCULAR | Status: DC | PRN

## 2021-08-26 MED ORDER — SODIUM CHLORIDE 0.9 % IV SOLN: 250.0000 mL | INTRAVENOUS | Status: DC

## 2021-08-26 MED ORDER — PERFLUTREN LIPID MICROSPHERE
1.0000 mL | INTRAVENOUS | Status: AC | PRN
  Administered 2021-08-26: 4 mL via INTRAVENOUS

## 2021-08-26 MED ORDER — DULOXETINE HCL 30 MG PO CPEP
30.0000 mg | ORAL_CAPSULE | Freq: Three times a day (TID) | ORAL | Status: DC
  Administered 2021-08-26 – 2021-08-28 (×7): 30 mg via ORAL
  Filled 2021-08-26 (×7): qty 1

## 2021-08-26 MED ORDER — CHLORHEXIDINE GLUCONATE CLOTH 2 % EX PADS
6.0000 | MEDICATED_PAD | Freq: Every day | CUTANEOUS | Status: DC
Start: 2021-08-27 — End: 2021-08-27

## 2021-08-26 MED ORDER — NOREPINEPHRINE 4 MG/250ML-% IV SOLN
2.0000 ug/min | INTRAVENOUS | Status: DC
  Administered 2021-08-26: 2 ug/min via INTRAVENOUS
  Filled 2021-08-26: qty 250

## 2021-08-26 MED ORDER — SPIRONOLACTONE 12.5 MG HALF TABLET
12.5000 mg | ORAL_TABLET | ORAL | Status: DC
Start: 2021-08-27 — End: 2021-08-26

## 2021-08-26 MED ORDER — GABAPENTIN 400 MG PO CAPS
400.0000 mg | ORAL_CAPSULE | Freq: Three times a day (TID) | ORAL | Status: DC
  Administered 2021-08-26 – 2021-08-28 (×7): 400 mg via ORAL
  Filled 2021-08-26 (×7): qty 1

## 2021-08-26 MED ORDER — ACETAMINOPHEN 325 MG PO TABS
650.0000 mg | ORAL_TABLET | ORAL | Status: DC | PRN
  Administered 2021-08-27 – 2021-08-28 (×2): 650 mg via ORAL
  Filled 2021-08-26 (×2): qty 2

## 2021-08-26 MED ORDER — ALBUMIN HUMAN 25 % IV SOLN
25.0000 g | Freq: Four times a day (QID) | INTRAVENOUS | Status: AC
  Administered 2021-08-26 (×2): 25 g via INTRAVENOUS
  Filled 2021-08-26 (×2): qty 100

## 2021-08-26 MED ORDER — ATORVASTATIN CALCIUM 40 MG PO TABS
80.0000 mg | ORAL_TABLET | Freq: Every day | ORAL | Status: DC
  Administered 2021-08-26 – 2021-08-27 (×2): 80 mg via ORAL
  Filled 2021-08-26 (×2): qty 2

## 2021-08-26 MED ORDER — SACUBITRIL-VALSARTAN 24-26 MG PO TABS
1.0000 | ORAL_TABLET | Freq: Two times a day (BID) | ORAL | Status: DC
  Filled 2021-08-26: qty 1

## 2021-08-26 NOTE — Assessment & Plan Note (Addendum)
Hypotensive - Holding chlorthalidone in the setting of AKI -Holding spironolactone, Coreg and Entresto >> Due to hypotension

## 2021-08-26 NOTE — Consult Note (Addendum)
Cardiology Consultation:   Patient ID: CHALSEA MCBRAYER MRN: [id number]; DOB: 08-27-1945  Admit date: 08/25/2021 Date of Consult: 08/26/2021  PCP:  Laqueta Due., MD   [facility] Providers Cardiologist:  Evaluated by Dr. Antoine Poche during last admission; Scheduled to see Cardiology at [facility] next month but wishes to follow-up with [facility] in [place], [place]  Patient Profile:   KIMRA LEIBFRIED is a 76 y.o. female with a hx of HTN, HLD non-Hodgkin's lymphoma (in remission), severe spinal stenosis and recently diagnosed CAD and CHF who is being seen 08/26/2021 for the evaluation of chest pain at the request of Dr. Carren Rang.  History of Present Illness:   Ms. Lachman was recently admitted to [facility] from 6/3 - 08/09/2021 as a code STEMI given her abnormal EKG on presentation but she reported having chest pain for the past week.  Initial catheterization showed severe multivessel CAD with 80 to 90% proximal and mid LAD stenoses, 70% proximal RI stenosis, multifocal LCx disease up to 90% in the mid/distal vessel and diffusely diseased proximal/mid RCA with up to 50% stenosis as well as sequential 90% ostial and 70% mid RPDA lesions. CT surgery was consulted but it was felt that her distal inferior wall was not viable and she would be high risk for CABG. A cardiac MRI was performed and showed no evidence of viable myocardium. She was noted to have a small apical thrombus. Echocardiogram showed her EF was reduced at 30 to 35% and RV function was normal. She initially wished not to pursue intervention including PCI but given persistent chest pain during admission, PCI was recommended. She went back to the catheterization lab on 08/08/2021 and underwent successful PCI of the proximal and mid LAD tandem stenoses with DES x2. Given her LV apical thrombus, it was recommended to be on triple therapy for a month with ASA, Plavix and Eliquis and after 30 days, stop ASA. She was started on Coreg 3.125 mg twice daily,  Chlorthalidone 12.5 mg daily, Farxiga 10 mg daily, Spironolactone 12.5mg  three times weekly and Entresto 24-26mg  BID. Toprol-XL 100mg  daily was also listed in her discharge medications.   She presented to [facility] ED on 08/25/2021 for evaluation of chest discomfort which started earlier in the morning. She reports having intermittent chest pain since recent admission but developed more significant pain yesterday. Says the pain was initially in her back but started to radiate into her chest. Would last for 15-20 minutes and improve with SL NTG. No recent orthopnea, PND or pitting edema. Says a Nutritionist told her to limit fluid intake to 1.0 - 1.5 L daily and she had been doing this. Reports compliance with her medications but is unsure what BP has been at home and she does not have a cuff that currently works. Has felt dizzy for several days.   Initial labs showed WBC 5.5, Hgb 11.6, platelets 178, Na+ 136, K+ 3.7 and creatinine 1.62. (at 0.83 at the time of recent hospital discharge). Initial and repeat Hs Troponin values have been negative at 13, 12 and 12. CXR with no active cardiopulmonary disease. EKG shows NSR, HR 76 with ST elevation along the anterior leads (similar to prior tracings) and diffuse TWI along the inferior and lateral leads.   She was continued on ASA, Plavix, Eliquis and Coreg. Entresto, Spironolactone, Chlorthalidone and Marcelline Deist were held due to soft BP and her AKI. Received a 500 mL fluid bolus. Repeat labs this AM show her creatinine has improved to 1.20. Says her  pain resolved yesterday evening and she has not had recurrent pain overnight or this morning.   Past Medical History:  Diagnosis Date   Arthritis    Hx of non-Hodgkin's lymphoma    Hypertension     Past Surgical History:  Procedure Laterality Date   CORONARY STENT INTERVENTION N/A 08/08/2021   Procedure: CORONARY STENT INTERVENTION;  Surgeon: Tonny Bollman, MD;  Location: [facility] INVASIVE CV LAB;  Service:  Cardiovascular;  Laterality: N/A;   INTRAVASCULAR ULTRASOUND/IVUS N/A 08/08/2021   Procedure: Intravascular Ultrasound/IVUS;  Surgeon: Tonny Bollman, MD;  Location: [facility] INVASIVE CV LAB;  Service: Cardiovascular;  Laterality: N/A;   LEFT HEART CATH AND CORONARY ANGIOGRAPHY N/A 08/02/2021   Procedure: LEFT HEART CATH AND CORONARY ANGIOGRAPHY;  Surgeon: Yvonne Kendall, MD;  Location: [facility] INVASIVE CV LAB;  Service: Cardiovascular;  Laterality: N/A;     Home Medications:  Prior to Admission medications   Medication Sig Start Date End Date Taking? Authorizing Provider  ALPRAZolam Prudy Feeler) 0.5 MG tablet Take 0.5 mg by mouth 2 (two) times daily as needed. 10/26/20  Yes [provider]  apixaban (ELIQUIS) 5 MG TABS tablet Take 1 tablet (5 mg total) by mouth 2 (two) times daily. 08/09/21  Yes Barrett, Joline Salt, PA-C  aspirin EC 81 MG tablet Take 1 tablet (81 mg total) by mouth daily. Swallow whole. 08/09/21  Yes Barrett, Joline Salt, PA-C  atorvastatin (LIPITOR) 80 MG tablet Take 1 tablet (80 mg total) by mouth at bedtime. 08/09/21  Yes Barrett, Joline Salt, PA-C  bisacodyl (DULCOLAX) 10 MG suppository Place 1 suppository (10 mg total) rectally daily as needed for moderate constipation. 08/09/21  Yes Barrett, Joline Salt, PA-C  Calcium Carb-Cholecalciferol (OYSTER SHELL CALCIUM W/D) 500-5 MG-MCG TABS Take by mouth.   Yes [provider]  carvedilol (COREG) 3.125 MG tablet Take 1 tablet (3.125 mg total) by mouth 2 (two) times daily with a meal. 08/09/21  Yes Barrett, Joline Salt, PA-C  chlorthalidone (HYGROTON) 25 MG tablet Take 12.5 mg by mouth every other day. 11/13/15  Yes [provider]  clopidogrel (PLAVIX) 75 MG tablet Take 1 tablet (75 mg total) by mouth daily. 08/10/21  Yes Barrett, Joline Salt, PA-C  cyanocobalamin (,VITAMIN B-12,) 1000 MCG/ML injection Inject 1 mcg into the muscle every 30 (thirty) days. 07/10/21  Yes [provider]  dapagliflozin propanediol (FARXIGA) 10 MG TABS tablet  Take 1 tablet (10 mg total) by mouth daily. 08/10/21  Yes Barrett, Joline Salt, PA-C  diclofenac Sodium (VOLTAREN) 1 % GEL Apply 2 g topically 4 (four) times daily as needed (pain). 08/19/17  Yes [provider]  DULoxetine (CYMBALTA) 30 MG capsule Take 30 mg by mouth 3 (three) times daily. 02/20/21  Yes [provider]  fluocinonide gel (LIDEX) 0.05 %  08/19/21  Yes [provider]  fluticasone (FLONASE) 50 MCG/ACT nasal spray Place 2 sprays into both nostrils daily as needed for allergies. 07/30/21  Yes [provider]  gabapentin (NEURONTIN) 800 MG tablet Take 800 mg by mouth 3 (three) times daily. 12/25/20  Yes [provider]  melatonin 5 MG TABS Take 1 tablet (5 mg total) by mouth at bedtime as needed. Patient taking differently: Take 6 mg by mouth at bedtime as needed. 08/09/21  Yes Barrett, Joline Salt, PA-C  nitroGLYCERIN (NITROSTAT) 0.4 MG SL tablet Place 1 tablet (0.4 mg total) under the tongue every 5 (five) minutes x 3 doses as needed for chest pain. 08/09/21  Yes Barrett, Joline Salt, PA-C  oxycodone (  ROXICODONE) 30 MG immediate release tablet Take 30 mg by mouth 3 (three) times daily as needed for pain. 11/20/20  Yes [provider]  pantoprazole (PROTONIX) 40 MG tablet Take 40 mg by mouth daily. 07/30/21  Yes [provider]  sacubitril-valsartan (ENTRESTO) 24-26 MG Take 1 tablet by mouth 2 (two) times daily. 08/09/21  Yes Barrett, Joline Salt, PA-C  spironolactone (ALDACTONE) 25 MG tablet Take 0.5 tablets (12.5 mg total) by mouth 3 (three) times a week. 08/10/21 08/10/22 Yes Barrett, Joline Salt, PA-C    Inpatient Medications: Scheduled Meds:  apixaban  5 mg Oral BID   aspirin EC  81 mg Oral Daily   atorvastatin  80 mg Oral QHS   carvedilol  3.125 mg Oral BID WC   clopidogrel  75 mg Oral Daily   DULoxetine  30 mg Oral TID   gabapentin  400 mg Oral TID   melatonin  6 mg Oral QHS   Continuous Infusions:  PRN Meds: acetaminophen,  ALPRAZolam, ondansetron (ZOFRAN) IV  Allergies:    Allergies  Allergen Reactions   Morphine Rash    Social History:   Social History   Socioeconomic History   Marital status: Widowed    Spouse name: Not on file   Number of children: Not on file   Years of education: Not on file   Highest education level: Not on file  Occupational History   Not on file  Tobacco Use   Smoking status: Every Day    Types: Cigarettes   Smokeless tobacco: Never  Substance and Sexual Activity   Alcohol use: Not Currently   Drug use: Never   Sexual activity: Not on file  Other Topics Concern   Not on file  Social History Narrative   Not on file   Social Determinants of Health   Financial Resource Strain: Not on file  Food Insecurity: Not on file  Transportation Needs: Not on file  Physical Activity: Not on file  Stress: Not on file  Social Connections: Not on file  Intimate Partner Violence: Not on file    Family History:   Family History  Problem Relation Age of Onset   Aortic aneurysm Mother    Coronary artery disease Brother      ROS:  Please see the history of present illness.   All other ROS reviewed and negative.     Physical Exam/Data:   Vitals:   08/26/21 0042 08/26/21 0459 08/26/21 0840 08/26/21 0842  BP: (!) 112/92 (!) 90/45 (!) 73/34 (!) 70/40  Pulse: 71 (!) 56 (!) 55 (!) 55  Resp: 17 14 14    Temp: 99 F (37.2 C) 98.6 F (37 C)    TempSrc: Oral     SpO2: 100% 99%    Weight: 64.5 kg     Height: 5\' 3"  (1.6 m)      No intake or output data in the 24 hours ending 08/26/21 0850    08/26/2021   12:42 AM 08/25/2021    8:16 PM 08/07/2021    9:12 PM  Last 3 Weights  Weight (lbs) 142 lb 3.2 oz 141 lb 139 lb 8 oz  Weight (kg) 64.5 kg 63.957 kg 63.277 kg     Body mass index is 25.19 kg/m.  General:  Well nourished, well developed female appearing in no acute distress. HEENT: normal Neck: no JVD Vascular: No carotid bruits; Distal pulses 2+ bilaterally Cardiac:   normal S1, S2; RRR; no murmur. Lungs:  clear to auscultation bilaterally,  no wheezing, rhonchi or rales  Abd: soft, nontender, no hepatomegaly  Ext: no pitting edema Musculoskeletal:  No deformities, BUE and BLE strength normal and equal Skin: warm and dry  Neuro:  CNs 2-12 intact, no focal abnormalities noted Psych:  Normal affect   EKG:  The EKG was personally reviewed and demonstrates:  NSR, HR 76 with ST elevation along the anterior leads (similar to prior tracings) and diffuse TWI along the inferior and lateral leads.  Telemetry:  Telemetry was personally reviewed and demonstrates: NSR, HR in 50's to 60's.   Relevant CV Studies:  LHC: 07/2021 Conclusions: Severe three-vessel coronary artery disease including sequential 80-90% proximal and mid LAD stenoses, 70% proximal ramus intermedius lesion, multifocal LCx disease of up to 90% in the mid/distal vessel, and diffusely diseased proximal/mid RCA with up to 50% stenosis as well as sequential 90% ostial and 70% mid RPDA lesions. Moderately-severely elevated left ventricular filling pressure (LVEDP 30-35 mmHg).  LVEF suboptimally assessed but is likely mildly-moderately reduced with anterior hypokinesis.   Recommendations: Case discussed with Dr. Laneta Simmers (cardiac surgery).  There is not a clear culprit for the patient's EKG changes.  I suspect that she may have had an ischemic event several days ago with her transient chest pain and is now manifesting predominantly heart failure symptoms.  We will optimize her heart failure while discussing optimal revascularization strategy. Patient given furosemide 40 mg IV at the end of the procedure.  Continue diuresis as blood pressure and renal function tolerate. Hold home doses of metoprolol and lisinopril for now.  Reinstitute goal-directed medical therapy as tolerated based on blood pressure, renal function, and heart failure. Obtain echocardiogram. Restart IV heparin 2 hours after TR band removal.   Defer adding P2Y12 inhibitor pending cardiac surgery consultation. Aggressive secondary prevention of coronary artery disease.    Coronary Stent Intervention: 08/08/2021   Mid LAD-1 lesion is 80% stenosed.   Mid LAD-2 lesion is 90% stenosed.   Post intervention, there is a 0% residual stenosis.   Post intervention, there is a 0% residual stenosis.   Successful PCI of the proximal and mid LAD tandem stenoses using a 3.0 x 20 mm Synergy DES over the proximal lesion and a 2.25 x 32 mm Synergy DES over the distal lesions.  The procedure is guided by intravascular ultrasound.   Recommendations: Dual antiplatelet therapy with aspirin and clopidogrel x12 months without interruption.  Aggressive risk reduction measures and anti-ischemic therapy for residual ischemic heart disease.  cMRI: 07/2021 IMPRESSION: No evidence of late gadolinium enhancement-suggestive of viable myocardium.   Small LV thrombus.  Echo: 07/2021 IMPRESSIONS     1. Left ventricular ejection fraction, by estimation, is 30 to 35%. The  left ventricle has moderately decreased function. The left ventricle  demonstrates regional wall motion abnormalities (see scoring  diagram/findings for description). There is  moderate asymmetric left ventricular hypertrophy of the basal-septal  segment. Left ventricular diastolic parameters are consistent with Grade I  diastolic dysfunction (impaired relaxation).   2. LV apical mural thrombus   3. Right ventricular systolic function is normal. The right ventricular  size is normal. Tricuspid regurgitation signal is inadequate for assessing  PA pressure.   4. The mitral valve is degenerative. Trivial mitral valve regurgitation.  No evidence of mitral stenosis. Moderate mitral annular calcification.   5. The aortic valve was not well visualized. Aortic valve regurgitation  is trivial. No aortic stenosis is present.   6. The inferior vena cava is normal in size with  greater than 50%   respiratory variability, suggesting right atrial pressure of 3 mmHg.   Laboratory Data:  High Sensitivity Troponin:   Recent Labs  Lab 08/02/21 2135 08/03/21 0033 08/25/21 2038 08/25/21 2210 08/26/21 0356  TROPONINIHS 78* 139* 13 12 12      Chemistry Recent Labs  Lab 08/25/21 2038 08/26/21 0356  NA 136 139  K 3.7 3.6  CL 99 103  CO2 28 28  GLUCOSE 187* 94  BUN 33* 32*  CREATININE 1.62* 1.20*  CALCIUM 8.9 8.8*  MG  --  2.1  GFRNONAA 33* 47*  ANIONGAP 9 8    Recent Labs  Lab 08/26/21 0356  PROT 5.7*  ALBUMIN 3.3*  AST 18  ALT 13  ALKPHOS 38  BILITOT 0.3   Lipids No results for input(s): "CHOL", "TRIG", "HDL", "LABVLDL", "LDLCALC", "CHOLHDL" in the last 168 hours.  Hematology Recent Labs  Lab 08/25/21 2038 08/26/21 0356  WBC 5.5 4.3  RBC 3.81* 3.49*  HGB 11.6* 10.6*  HCT 36.1 32.5*  MCV 94.8 93.1  MCH 30.4 30.4  MCHC 32.1 32.6  RDW 13.9 14.2  PLT 178 163   Thyroid No results for input(s): "TSH", "FREET4" in the last 168 hours.  BNPNo results for input(s): "BNP", "PROBNP" in the last 168 hours.  DDimer No results for input(s): "DDIMER" in the last 168 hours.   Radiology/Studies:  DG Chest Port 1 View  Result Date: 08/26/2021 CLINICAL DATA:  Chest pain EXAM: PORTABLE CHEST 1 VIEW COMPARISON:  08/03/2021 FINDINGS: Heart and mediastinal contours are within normal limits. No focal opacities or effusions. No acute bony abnormality. Aortic atherosclerosis. IMPRESSION: No active cardiopulmonary disease. Electronically Signed   By: Charlett Nose M.D.   On: 08/26/2021 00:15     Assessment and Plan:   1. Chest Pain/CAD - Presented with chest pain which resolved yesterday evening. Was responsive to SL NTG and she does have known residual disease by recent catheterization but she did not wish to undergo CABG and her inferior wall was not viable by cardiac MRI. Ultimately received DES x2 to LAD but had residual disease along her RCA and LCx. - Hs Troponin values  have been negative. Her EKG remains abnormal and shows ST elevation along the anterior leads (similar to prior tracings) and diffuse TWI along the inferior and lateral leads.  - Continue ASA 81mg  daily, Atorvastatin 80mg  daily and Plavix 75mg  daily. Was previously recommended to be on triple therapy with ASA, Plavix and Eliquis until 09/07/2021 then could stop ASA. Will hold Coreg for now given hypotension.   2. HFrEF - Recent echo showed her EF was reduced at 30-35%. She appears dehydrated on examination and given her hypotension (SBP currently in the 80's and verified manually) and AKI, will order another 500 mL fluid bolus. Appears her hypotension at this time is secondary to dehydration as compared to shock. She was on Coreg, Toprol-XL (?), Farxiga, Entresto, Spironolactone and Chlorthalidone prior to admission. Will hold all for now. Anticipate restarting a low-dose BB, low-dose ARB and SGLT2 inhibitor prior to discharge if BP allows. Would ultimately plan for a repeat echo in several months for reassessment of her EF.   3. Apical Thrombus - Noted on cMRI during her recent admission. On Eliquis 5mg  BID instead of Coumadin given the need for triple therapy. Plan for repeat echo as an outpatient to make sure this has resolved.   4. AKI - Creatinine elevated to 1.62 on admission (at 0.83 at the time of recent  hospital discharge). Improved to 1.20 today. Will order additional fluid bolus given hypotension.   For questions or updates, please contact [facility] Please consult [email or web address] for contact info under    Signed, Ellsworth Lennox, PA-C  08/26/2021 8:50 AM  Attending note Patient seen and discussed with PA Iran Ouch, I agree with her documentation.  76 yo female history of HTN, HL, CAD with admit 07/2021 with late presenting STEMI received DES xt to LAD, residual disease managed medically unless recurrent symptoms. LV apical thrombus presents with chest pain   K 3.7 Cr 1.62 WBC 5.5  Hgb 11.6 Plt 178  Trop 13-->12-->12 EKG chronic ST/T changes nothing acute CXR no acute process  07/2021 cath: LM normal, LAD prox 90%, mid 80-90%, ramus 70%, ostial LCX 50% mid 70% distal 90%, RCA mid 50%, RPDA 1 90%, RPDA 2 70%. LVEDP 30-35  07/2021 cath intervention: DES x 2 to LAD.  07/2021 echo: LVEF 30-35%, grade I dd, LV apical thrombus 07/2021 cMRI: LAD territory showed viability   1.CAD/Chest pain admit 07/2021 with late presenting STEMI received DES xt to LAD, residual disease managed medically unless recurrent symptoms - presents with chest pains, trops are negative. EKG chronic ST/T changes nothing acute - no evidence of ACS, continue medical management. Could consider adding ranexa  2.Hypotension - likely secondary to hypovolemia, continue IVFs. Received NS bolus x 2.  - hold home bp meds, continue IVFs   3. AKI - admit Cr 1.62, baseline 0.8-0.9 - Cr improving with IVFs consistent with prerenal etiology, continue gentle IVFs. -   4.LV apical thrombus - plan was for 30 days triple therapy ASA,plavix, eliquis then stop aspirin  5.Anemia - Hgb 10.6 this AM, had been 14-15 earlier this month. She is on triple therapy - monitor at this time, pending trends may warrant futher workup.   6. Chronic systolic HF 07/2021 echo: LVEF 28-41%, grade I dd, LV apical thrombus - hold home meds due to low bp's - hypovolemic, gentle IVFs   Dina Rich MD

## 2021-08-26 NOTE — Progress Notes (Addendum)
   08/26/21 1123  Vitals  BP (!) 78/36  BP Method Manual  Pulse Rate (!) 54  Level of Consciousness  Level of Consciousness Alert  MEWS COLOR  MEWS Score Color Yellow  MEWS Score  MEWS Temp 0  MEWS Systolic 2  MEWS Pulse 0  MEWS RR 1  MEWS LOC 0  MEWS Score 3   2nd bolus has been started along with albumin, Pt has been put in trendelenburg to help with BP. MD Nevin Bloodgood has been paged.

## 2021-08-26 NOTE — Progress Notes (Signed)
MD Shahmehdi at bedside to assess pt. Verbal order received to increase NS fluid bolus to 531ml/hr and increase albumin infusion to 132ml/hr. Pt continues to deny any chest pain or SOB. Does report feeling lightheaded. Remains drowsy, but oriented x4 when awake. MD Shahmedhi advises to continue to monitor b/p and update him once bolus completed or if any changes.

## 2021-08-26 NOTE — Assessment & Plan Note (Addendum)
-   Creatinine increased from 0.83-1.62 -GFR decreased from greater than 60-33 -Patient appears clinically dry -Gentle hydration in the setting of chronic systolic CHF -500 mL bolus ordered over 2 hours  -Avoid nephrotoxic agents when possible -Reduce gabapentin dose while in the hospital due to AKI -Continue to monitor

## 2021-08-26 NOTE — Progress Notes (Signed)
   08/26/21 1022  Vitals  BP (!) 80/50  MAP (mmHg) (!) 59  BP Method Automatic  Pulse Rate (!) 55  Pulse Rate Source Monitor  Resp 14  MEWS COLOR  MEWS Score Color Yellow  Oxygen Therapy  SpO2 98 %  O2 Device Room Air  MEWS Score  MEWS Temp 0  MEWS Systolic 2  MEWS Pulse 0  MEWS RR 0  MEWS LOC 0  MEWS Score 2   NT asked nurse to come asses pt due to her being lethargic and falling asleep while having conversation. Pt is alert But continues to fall asleep in mid conversation. Md notified ( see new Order)

## 2021-08-26 NOTE — Progress Notes (Addendum)
  Was called to the bedside: Patient was seen and examined for persistent hypotension  Despite IV fluid 1.5 L, midodrine, albumin patient remained hypotensive   Blood pressure (!) 88/28, pulse (!) 58, temperature 98.6 F (37 C), resp. rate 16, height 5\' 3"  (1.6 m), weight 64.5 kg, SpO2 100 %.  Hypotension: Discussed with cardiology Dr. Wyline Mood, labs ordered, Decided to move the patient to ICU for brief infusion of Levophed  Lactic acidosis -2 infection at this time, likely due to hypotension Status post 1.5 L IV fluid  Infection, mildly elevated lactic acid  Chest pain: Currently not complaining of any chest pain Resolved, troponins x3 negative Cardiology Dr. Wyline Mood following, EKG reviewed, echo has been reordered by cardiology    Aggressive IV fluid resuscitation contraindicated as patient has CHF, with a low ejection fraction     Latest Ref Rng & Units 08/26/2021   12:54 PM 08/26/2021    3:56 AM 08/25/2021    8:38 PM  CBC  WBC 4.0 - 10.5 K/uL  4.3  5.5   Hemoglobin 12.0 - 15.0 g/dL 38.7  56.4  33.2   Hematocrit 36.0 - 46.0 % 31.4  32.5  36.1   Platelets 150 - 400 K/uL  163  178       Latest Ref Rng & Units 08/26/2021    3:56 AM 08/25/2021    8:38 PM 08/09/2021    6:47 AM  BMP  Glucose 70 - 99 mg/dL 94  951  87   BUN 8 - 23 mg/dL 32  33  16   Creatinine 0.44 - 1.00 mg/dL 8.84  1.66  0.63   Sodium 135 - 145 mmol/L 139  136  138   Potassium 3.5 - 5.1 mmol/L 3.6  3.7  4.1   Chloride 98 - 111 mmol/L 103  99  104   CO2 22 - 32 mmol/L 28  28  26    Calcium 8.9 - 10.3 mg/dL 8.8  8.9  8.8    Lactic Acid, Venous    Component Value Date/Time   LATICACIDVEN 2.0 (HH) 08/26/2021 1254      SIGNED: Kendell Bane, MD, FHM. [facility],  Pager (please use [email or web address] to page/text)  > 55 critical care time minutes was spent in seeing evaluate patient, stabilizing the patient and transfer the patient to ICU, discussing plan of care with patient and cardiology, ICU  nursing staff   please use Epic Secure Chat for non-urgent communication (7AM-7PM) If 7PM-7AM, please contact night-coverage [email or web address],  08/26/2021, 2:03 PM

## 2021-08-26 NOTE — Assessment & Plan Note (Addendum)
-   Currently smoking but working on quitting -Advised of the importance of cessation especially in the setting of recent heart attack -NicoDerm patch offered

## 2021-08-26 NOTE — Progress Notes (Signed)
   08/26/21 0842  Vitals  BP (!) 70/40  BP Method Manual  Pulse Rate (!) 55  Level of Consciousness  Level of Consciousness Alert  MEWS COLOR  MEWS Score Color Yellow  Pain Assessment  Pain Scale 0-10  Pain Score 1  MEWS Score  MEWS Temp 0  MEWS Systolic 2  MEWS Pulse 0  MEWS RR 0  MEWS LOC 0  MEWS Score 2  Provider Notification  Provider Name/Title Randall An PA  Date Provider Notified 08/26/21  Time Provider Notified 0845  Method of Notification Face-to-face  Notification Reason Other (Comment)  Provider response See new orders  Date of Provider Response 08/26/21  Time of Provider Response [phone]

## 2021-08-26 NOTE — Assessment & Plan Note (Addendum)
-   Last echo was June 4 and showed an ejection fraction of 30-35%, left ventricular hypertrophy, and grade 1 diastolic dysfunction -Contine aspirin, statin,  - holding beta-blocker, spironolactone, Entresto --due to hypotension -Plan to restart Farxiga at discharge -Not currently in exacerbation -Continue to monitor

## 2021-08-26 NOTE — Assessment & Plan Note (Addendum)
-   With history of coronary artery disease -Recent cath June 9 [facility] cardiology >>  -Continue aspirin, Plavix, statin, beta-blocker (holding) -Currently stable mildly hypertensive denies any chest pain  -Repeat EKG for any repeat episodes of chest pain -Troponin flat at 13 and 12, 12, -Acute MI ruled out

## 2021-08-26 NOTE — Progress Notes (Signed)
Pt sitting up in bed, denies c/o SOB, lightheadedness, or CP. Pt states she feels, "a lot better". LOC greatly improved from earlier today. Pt ate 50% of lunch.  Manual b/p remains low, 88/28, HR 58, resp 16/min. Skin warm and dry, color pink. Breath sounds continue with faint crackles bilateral lower lungs. Pt has urinated clear yellow urine. MD Shahmehdi and MD Branch both notified of current status and VS.

## 2021-08-27 ENCOUNTER — Encounter (HOSPITAL_COMMUNITY): Payer: Self-pay | Admitting: Family Medicine

## 2021-08-27 DIAGNOSIS — R079 Chest pain, unspecified: Secondary | ICD-10-CM | POA: Diagnosis not present

## 2021-08-27 LAB — COMPREHENSIVE METABOLIC PANEL
ALT: 12 U/L (ref 0–44)
AST: 14 U/L — ABNORMAL LOW (ref 15–41)
Albumin: 3.4 g/dL — ABNORMAL LOW (ref 3.5–5.0)
Alkaline Phosphatase: 30 U/L — ABNORMAL LOW (ref 38–126)
Anion gap: 6 (ref 5–15)
BUN: 20 mg/dL (ref 8–23)
CO2: 27 mmol/L (ref 22–32)
Calcium: 8.6 mg/dL — ABNORMAL LOW (ref 8.9–10.3)
Chloride: 107 mmol/L (ref 98–111)
Creatinine, Ser: 0.83 mg/dL (ref 0.44–1.00)
GFR, Estimated: >60 mL/min (ref >60)
Glucose, Bld: 95 mg/dL (ref 70–99)
Potassium: 3.4 mmol/L — ABNORMAL LOW (ref 3.5–5.1)
Sodium: 140 mmol/L (ref 135–145)
Total Bilirubin: 0.4 mg/dL (ref 0.3–1.2)
Total Protein: 5.2 g/dL — ABNORMAL LOW (ref 6.5–8.1)

## 2021-08-27 LAB — CBC
HCT: 30.6 % — ABNORMAL LOW (ref 36.0–46.0)
Hemoglobin: 10 g/dL — ABNORMAL LOW (ref 12.0–15.0)
MCH: 30.3 pg (ref 26.0–34.0)
MCHC: 32.7 g/dL (ref 30.0–36.0)
MCV: 92.7 fL (ref 80.0–100.0)
Platelets: 147 10*3/uL — ABNORMAL LOW (ref 150–400)
RBC: 3.3 MIL/uL — ABNORMAL LOW (ref 3.87–5.11)
RDW: 14 % (ref 11.5–15.5)
WBC: 4.8 10*3/uL (ref 4.0–10.5)
nRBC: 0 % (ref 0.0–0.2)

## 2021-08-27 LAB — CORTISOL: Cortisol, Plasma: 4.3 ug/dL

## 2021-08-27 MED ORDER — POTASSIUM CHLORIDE CRYS ER 20 MEQ PO TBCR
40.0000 meq | EXTENDED_RELEASE_TABLET | Freq: Once | ORAL | Status: AC
Start: 2021-08-27 — End: 2021-08-27
  Administered 2021-08-27: 40 meq via ORAL
  Filled 2021-08-27: qty 2

## 2021-08-27 MED ORDER — DAPAGLIFLOZIN PROPANEDIOL 10 MG PO TABS
10.0000 mg | ORAL_TABLET | Freq: Every day | ORAL | Status: DC
  Administered 2021-08-27 – 2021-08-28 (×2): 10 mg via ORAL
  Filled 2021-08-27 (×3): qty 1

## 2021-08-27 MED ORDER — MIDODRINE HCL 5 MG PO TABS
5.0000 mg | ORAL_TABLET | Freq: Three times a day (TID) | ORAL | Status: DC
  Administered 2021-08-27 (×2): 5 mg via ORAL
  Filled 2021-08-27 (×2): qty 1

## 2021-08-27 MED ORDER — POTASSIUM CHLORIDE CRYS ER 20 MEQ PO TBCR
20.0000 meq | EXTENDED_RELEASE_TABLET | Freq: Once | ORAL | Status: DC
Start: 2021-08-27 — End: 2021-08-27

## 2021-08-27 MED ORDER — POLYETHYLENE GLYCOL 3350 17 G PO PACK
17.0000 g | PACK | Freq: Every day | ORAL | Status: DC
  Administered 2021-08-27: 17 g via ORAL
  Filled 2021-08-27 (×2): qty 1

## 2021-08-27 MED ORDER — ORAL CARE MOUTH RINSE: 15.0000 mL | OROMUCOSAL | Status: DC | PRN

## 2021-08-27 NOTE — Progress Notes (Addendum)
Progress Note  Patient Name: [name] Date of Encounter: 08/27/2021  [facility] Cardiologist: Evaluated by Dr. Percival Spanish during last admission; Scheduled to see Cardiology at [place] next month but wishes to follow-up with [facility] in [place], [place]  Subjective   Did not sleep well last night due to frequent interruptions. Denies any chest pain or palpitations. Breathing is at baseline. Many questions regarding her home medications and what has been held.   Inpatient Medications    Scheduled Meds:  apixaban  5 mg Oral BID   aspirin EC  81 mg Oral Daily   atorvastatin  80 mg Oral QHS   Chlorhexidine Gluconate Cloth  6 each Topical Q0600   clopidogrel  75 mg Oral Daily   DULoxetine  30 mg Oral TID   gabapentin  400 mg Oral TID   melatonin  6 mg Oral QHS   midodrine  10 mg Oral TID WC   Continuous Infusions:  sodium chloride     norepinephrine (LEVOPHED) Adult infusion Stopped (08/26/21 1723)   PRN Meds: acetaminophen, ALPRAZolam, ondansetron (ZOFRAN) IV   Vital Signs    Vitals:   08/27/21 0300 08/27/21 0400 08/27/21 0500 08/27/21 0528  BP: (!) 114/46 112/60 (!) 127/51 (!) 143/77  Pulse: (!) 56 (!) 55 (!) 52 (!) 51  Resp: '12 14 14 18  '$ Temp:  98 F (36.7 C)    TempSrc:      SpO2: 96% 96% 98% 98%  Weight:   66.7 kg   Height:        Intake/Output Summary (Last 24 hours) at 08/27/2021 0734 Last data filed at 08/26/2021 2258 Gross per 24 hour  Intake 1146.51 ml  Output 400 ml  Net 746.51 ml      08/27/2021    5:00 AM 08/26/2021    3:43 PM 08/26/2021   12:42 AM  Last 3 Weights  Weight (lbs) 147 lb 0.8 oz 147 lb 0.8 oz 142 lb 3.2 oz  Weight (kg) 66.7 kg 66.7 kg 64.5 kg      Telemetry    Sinus bradycardia, HR in 50's.  - Personally Reviewed  ECG    NSR, HR 60 with diffuse ST abnormalities similar to prior tracings and QT prolongation.  - Personally Reviewed  Physical Exam   GEN: Pleasant female appearing in no acute distress.   Neck: No JVD Cardiac: RRR,  no murmurs, rubs, or gallops.  Respiratory: Clear to auscultation bilaterally without wheezing or rales. GI: Soft, nontender, non-distended  MS: No pitting edema; No deformity. Neuro:  Nonfocal  Psych: Normal affect   Labs    High Sensitivity Troponin:   Recent Labs  Lab 08/02/21 2135 08/03/21 0033 08/25/21 2038 08/25/21 2210 08/26/21 0356  TROPONINIHS 78* 139* '13 12 12     '$ Chemistry Recent Labs  Lab 08/25/21 2038 08/26/21 0356 08/27/21 0410  NA 136 139 140  K 3.7 3.6 3.4*  CL 99 103 107  CO2 '28 28 27  '$ GLUCOSE 187* 94 95  BUN 33* 32* 20  CREATININE 1.62* 1.20* 0.83  CALCIUM 8.9 8.8* 8.6*  MG  --  2.1  --   PROT  --  5.7* 5.2*  ALBUMIN  --  3.3* 3.4*  AST  --  18 14*  ALT  --  13 12  ALKPHOS  --  38 30*  BILITOT  --  0.3 0.4  GFRNONAA 33* 47* >60  ANIONGAP '9 8 6    '$ Lipids No results for input(s): "CHOL", "TRIG", "  HDL", "LABVLDL", "[place]", "CHOLHDL" in the last 168 hours.  Hematology Recent Labs  Lab 08/25/21 2038 08/26/21 0356 08/26/21 1254 08/27/21 0410  WBC 5.5 4.3  --  4.8  RBC 3.81* 3.49* 3.25* 3.30*  HGB 11.6* 10.6* 10.1* 10.0*  HCT 36.1 32.5* 31.4* 30.6*  MCV 94.8 93.1  --  92.7  MCH 30.4 30.4  --  30.3  MCHC 32.1 32.6  --  32.7  RDW 13.9 14.2  --  14.0  PLT 178 163  --  147*   Thyroid No results for input(s): "TSH", "FREET4" in the last 168 hours.  BNPNo results for input(s): "BNP", "PROBNP" in the last 168 hours.  DDimer No results for input(s): "DDIMER" in the last 168 hours.   Radiology     DG Chest Port 1 View  Result Date: 08/26/2021 CLINICAL DATA:  Chest pain EXAM: PORTABLE CHEST 1 VIEW COMPARISON:  08/03/2021 FINDINGS: Heart and mediastinal contours are within normal limits. No focal opacities or effusions. No acute bony abnormality. Aortic atherosclerosis. IMPRESSION: No active cardiopulmonary disease. Electronically Signed   By: Rolm Baptise M.D.   On: 08/26/2021 00:15    Cardiac Studies   Limited Echo: 08/26/2021 IMPRESSIONS      1. Distal apical hypokinesis. No thrombus noted. . Left ventricular  ejection fraction, by estimation, is 60 to 65%. The left ventricle has  normal function. There is mild left ventricular hypertrophy.   2. Right ventricular systolic function is normal. The right ventricular  size is normal.   3. Limited echo to evaluate LV function. Much improved LVEF compared to  08/03/21 study.   Patient Profile     76 y.o. female w/ PMH of CAD (s/p cath in 07/2021 showing severe multivessel CAD and not felt to have viable myocardium for CABG --> s/p DES x2 to LAD), HFrEF (EF 30-35% by echo in 06//05/2021) HTN, HLD non-Hodgkin's lymphoma (in remission) and severe spinal stenosis who is currently admitted for evaluation of chest pain.   Assessment & Plan    1. Chest Pain/CAD - Recent DESx2 to the LAD earlier this month as not felt to be a candidate for CABG given poor myocardial viability and the patient says she would have not wanted to proceed with CABG. Presented with chest pain which improved with SL NTG and spontaneously resolved the evening of admission.  - Troponin values have been negative and EKG shows chronic changes. Repeat limited echo shows her EF has normalized to 60-65%. No plans for further ischemic testing at this time.  - Continue ASA, Plavix and Atorvastatin. Previously recommended by Interventional Cardiology to stop ASA once 30-days out from PCI given she is on triple therapy. Coreg held given hypotension.   2. Hypotension - Present on admission but acutely exacerbated yesterday. Received IV fluids with minimal improvement and was started on Midodrine '10mg'$  TID and required Levophed temporarily which has now been discontinued. Lactic Acid initially 2.0 with repeat of 1.3. Procalcitonin negative. Suspect this was secondary to hypovolemia in the setting of decreased fluid intake and due to her multiple PTA medications for her cardiomyopathy. BP improved to 128/60 on most recent check this  morning.   3. HFrEF - Recent echo showed her EF was reduced at 30-35% and she was on Coreg, Toprol-XL (?), Farxiga, Entresto, Spironolactone and Chlorthalidone prior to admission. All were held yesterday given her hypotension and repeat limited echo shows her EF has normalized to 60-65%. Continue to hold medical therapy for now. Would not plan to restart  a beta-blocker at this time given her HR in the 50's.    3. Apical Thrombus - Noted on cMRI during her recent admission. Echo this admission shows distal apical HK but no definitive thrombus. She has been on Eliquis '5mg'$  BID instead of Coumadin given the need for triple therapy. Will review with Dr. Harl Bowie in regard to timeframe for continuing Eliquis.    4. AKI - Creatinine elevated to 1.62 on admission and this has now normalized at 0.83. K+ low at 3.4 this AM and will order supplementation.   5. Anemia - Hgb at 12.4  -15.2 earlier this month, at 11.6 on admission and at 10.0 today. No reports of active bleeding. Ferritin low-normal at 29. Further work-up per the admitting team.    For questions or updates, please contact [place] Please consult [email or web address] for contact info under       Signed, Erma Heritage, PA-C  08/27/2021, 7:34 AM    Patient seen and discussed with PA Ahmed Prima, I agree with her documenation.   1.CAD/Chest pain admit 07/2021 with late presenting STEMI received DES xt to LAD, residual disease managed medically unless recurrent symptoms - presents with chest pains, trops are negative. EKG chronic ST/T changes nothing acute - no evidence of ACS, continue medical management. Could consider adding ranexa - no recurrent chest pains - echo this admit actually showed LVEF has normalized    2.Hypotension - persistent hypotension yesterday despite IVFs - lactic acid mildly elevated at 2, f/u normalized at 1.3. Pro calc negative, repeat echo showed LVEF had normalized to 60-65% without acute findings. Mild  decline in Hgb over last few weeks but not severe to suggest significant acute blood loss. - transiently on levophed now off, started on oral midodrine. Wean midodrine to '5mg'$  tid, likely d/c this admit.  - would appear was solely due to profound hypovolemia     3. AKI - admit Cr 1.62, baseline 0.8-0.9 - admitted with AKI that has resolved with IVFs   4.LV apical thrombus - plan was for 30 days triple therapy ASA,plavix, eliquis then stop aspirin - on repeat echo no visualized thrombus, some residual distal apical akinesis but much improved from prior study - would continue at least 3 months anticoag.    5.Anemia - Hgb 10 this AM, had been 14-15 earlier this month. She is on triple therapy - no acute signs of blood loss - continue to monitor   6. Chronic systolic HF 08/5914 echo: LVEF 30-35%, grade I dd, LV apical thrombus Echo this admit LVEF 60-65%, some residual distal apical akinesis but much improved from prior study.  - hold home HF meds due to low bp's. Can resume farxiga,  Carlyle Dolly MD

## 2021-08-27 NOTE — Progress Notes (Incomplete)
Patient seen and discussed with PA Ahmed Prima, I agree with her documenation.   1.CAD/Chest pain admit 07/2021 with late presenting STEMI received DES xt to LAD, residual disease managed medically unless recurrent symptoms - presents with chest pains, trops are negative. EKG chronic ST/T changes nothing acute - no evidence of ACS, continue medical management. Could consider adding ranexa - no recurrent chest pains - echo this admit actually showed LVEF has normalized    2.Hypotension - persistent hypotension yesterday despite IVFs - lactic acid mildly elevated at 2, f/u normalized at 1.3. Pro calc negative, repeat echo showed LVEF had normalized to 60-65% without acute findings. Mild decline in Hgb over last few weeks but not severe to suggest significant acute blood loss. - transiently on levophed now off, started on oral midodrine. Wean midodrine to '5mg'$  tid, likely d/c this admit.  - would appear was solely due to profound hypovolemia     3. AKI - admit Cr 1.62, baseline 0.8-0.9 - admitted with AKI that has resolved with IVFs   4.LV apical thrombus - plan was for 30 days triple therapy ASA,plavix, eliquis then stop aspirin - on repeat echo no visualized thrombus, some residual distal apical akinesis but much improved from prior study - would continue at least 3 months anticoag.    5.Anemia - Hgb 10 this AM, had been 14-15 earlier this month. She is on triple therapy - no acute signs of blood loss - continue to monitor   6. Chronic systolic HF 03/7791 echo: LVEF 30-35%, grade I dd, LV apical thrombus Echo this admit LVEF 60-65%, some residual distal apical akinesis but much improved from prior study.  - hold home HF meds due to low bp's. Can resume farxiga,  Carlyle Dolly MD

## 2021-08-27 NOTE — Progress Notes (Signed)
PROGRESS NOTE    [name]  ZMO:[id number] DOB: November 25, 1945 DOA: 08/25/2021 PCP: Karleen Hampshire., MD   Brief Narrative:    [name] is a 76 y.o. female with medical history significant of coronary artery disease, hypertension, depression/anxiety, hyperlipidemia, tobacco use disorder, and more presents the ED with a chief complaint of chest pain.  There is no sign of ACS noted and cardiology has been following.  She was noted to have a brief episode of hypotension as well as AKI related to some profound hypovolemia.  This improved after IV fluid hydration as well as temporary use of vasopressors.  Assessment & Plan:   Principal Problem:   Chest pain Active Problems:   Hypotensive episode   Hypoalbuminemia   Atherosclerosis of coronary artery bypass graft of native heart without angina pectoris   Essential hypertension   GERD (gastroesophageal reflux disease)   Tobacco use disorder   Prediabetes   AKI (acute kidney injury) ([name])   Chronic systolic CHF (congestive heart failure) (HCC)  Assessment and Plan:   Chest pain-resolved - With history of coronary artery disease -Recent cath June 9 -Consulted cardiology >> appreciate recommendations -Continue aspirin, Plavix, statin, beta-blocker (holding) -Currently stable mildly hypertensive denies any chest pain   -Repeat EKG for any repeat episodes of chest pain -Troponin flat at 13 and 12, 12, -Acute MI ruled out -LVEF has normalized on echo   Hypotensive episode -Likely related to profound dehydration -Currently stable blood pressure readings and weaned off norepinephrine -Monitor on midodrine -Okay for transfer to telemetry   Hypoalbuminemia -Likely related to some malnutrition -Dietary consultation   Chronic systolic CHF (congestive heart failure) ([name]) - Last echo was June 4 and showed an ejection fraction of 30-35%, left ventricular hypertrophy, and grade 1 diastolic dysfunction -Contine aspirin, statin,  -  holding beta-blocker, spironolactone, Entresto --due to hypotension -resume [place] -Not currently in exacerbation -Continue to monitor   AKI (acute kidney injury) ([name]) -Prerenal and improved after IV fluid hydration -Resolved at this time, continue to monitor   -Avoid nephrotoxic agents when possible -Reduce gabapentin dose while in the hospital due to AKI -Continue to monitor   Prediabetes - Last hemoglobin A1c earlier this month was 5.6 -Glucose slightly elevated in the ED at 187 >> 94 -CBG   Tobacco use disorder - Currently smoking but working on quitting -Advised of the importance of cessation especially in the setting of recent heart attack -NicoDerm patch offered   GERD (gastroesophageal reflux disease) - Holding PPI in the setting of AKI   Essential hypertension Holding various blood pressure agents and started on midodrine, continue to monitor   Atherosclerosis of coronary artery bypass graft of native heart without angina pectoris - History of coronary artery disease with recent cardiac cath on June 9.   Successful PCI of the proximal and mid LAD tandem stenoses with a DES -Continue aspirin, Plavix, Eliquis, atorvastatin -Was previously recommended to be on triple therapy with ASA, Plavix and Eliquis until 09/07/2021 then could stop ASA   -Heart rate of 56, blood pressure low 70/40, with holding beta-blocker today   -Currently chest pain-free -We will continue to monitor on telemetry, appreciate cardiology input and close follow-up  LV apical thrombus -Continue on aspirin, Plavix, and Eliquis as noted per cardiology  Anemia -Continue to monitor while on anticoagulation -No signs of overt bleeding noted  Mild hypokalemia -Replete and reevaluate in a.m.    DVT prophylaxis:Eliquis Code Status: Partial Family Communication: None at bedside Disposition  Plan:  Status is: Inpatient Remains inpatient appropriate because: Needs continuous monitoring for blood  pressure, IV medications.   Consultants:  Cardiology  Procedures:  None  Antimicrobials:  None   Subjective: Patient seen and evaluated today with no new acute complaints or concerns. No acute concerns or events noted overnight.  Blood pressures are improved this morning.  She denies any chest pain or shortness of breath.  Objective: Vitals:   08/27/21 0528 08/27/21 0700 08/27/21 0800 08/27/21 0815  BP: (!) 143/77 128/60 (!) 126/53   Pulse: (!) 51 (!) 51 (!) 51 (!) 57  Resp: '18 14 15 16  '$ Temp:  97.6 F (36.4 C)    TempSrc:  Oral    SpO2: 98% 98% 97% 100%  Weight:      Height:        Intake/Output Summary (Last 24 hours) at 08/27/2021 1216 Last data filed at 08/26/2021 2258 Gross per 24 hour  Intake 906.51 ml  Output 400 ml  Net 506.51 ml   Filed Weights   08/26/21 0042 08/26/21 1543 08/27/21 0500  Weight: 64.5 kg 66.7 kg 66.7 kg    Examination:  General exam: Appears calm and comfortable  Respiratory system: Clear to auscultation. Respiratory effort normal. Cardiovascular system: S1 & S2 heard, RRR.  Gastrointestinal system: Abdomen is soft Central nervous system: Alert and awake Extremities: No edema Skin: No significant lesions noted Psychiatry: Flat affect.    Data Reviewed: I have personally reviewed following labs and imaging studies  CBC: Recent Labs  Lab 08/25/21 2038 08/26/21 0356 08/26/21 1254 08/27/21 0410  WBC 5.5 4.3  --  4.8  HGB 11.6* 10.6* 10.1* 10.0*  HCT 36.1 32.5* 31.4* 30.6*  MCV 94.8 93.1  --  92.7  PLT 178 163  --  509*   Basic Metabolic Panel: Recent Labs  Lab 08/25/21 2038 08/26/21 0356 08/27/21 0410  NA 136 139 140  K 3.7 3.6 3.4*  CL 99 103 107  CO2 '28 28 27  '$ GLUCOSE 187* 94 95  BUN 33* 32* 20  CREATININE 1.62* 1.20* 0.83  CALCIUM 8.9 8.8* 8.6*  MG  --  2.1  --    GFR: Estimated Creatinine Clearance: 52.1 mL/min (by C-G formula based on SCr of 0.83 mg/dL). Liver Function Tests: Recent Labs  Lab  08/26/21 0356 08/27/21 0410  AST 18 14*  ALT 13 12  ALKPHOS 38 30*  BILITOT 0.3 0.4  PROT 5.7* 5.2*  ALBUMIN 3.3* 3.4*   No results for input(s): "LIPASE", "AMYLASE" in the last 168 hours. No results for input(s): "AMMONIA" in the last 168 hours. Coagulation Profile: No results for input(s): "INR", "PROTIME" in the last 168 hours. Cardiac Enzymes: No results for input(s): "CKTOTAL", "CKMB", "CKMBINDEX", "TROPONINI" in the last 168 hours. BNP (last 3 results) No results for input(s): "PROBNP" in the last 8760 hours. HbA1C: No results for input(s): "HGBA1C" in the last 72 hours. CBG: No results for input(s): "GLUCAP" in the last 168 hours. Lipid Profile: No results for input(s): "CHOL", "HDL", "LDLCALC", "TRIG", "CHOLHDL", "LDLDIRECT" in the last 72 hours. Thyroid Function Tests: No results for input(s): "TSH", "T4TOTAL", "FREET4", "T3FREE", "THYROIDAB" in the last 72 hours. Anemia Panel: Recent Labs    08/26/21 1254  VITAMINB12 253  FOLATE 10.0  FERRITIN 29  TIBC 273  IRON 66  RETICCTPCT 1.5   Sepsis Labs: Recent Labs  Lab 08/26/21 1254 08/26/21 1645  PROCALCITON <0.10  --   LATICACIDVEN 2.0* 1.3    Recent Results (from  the past 240 hour(s))  MRSA Next Gen by PCR, Nasal     Status: None   Collection Time: 08/26/21  3:40 PM   Specimen: Nasal Mucosa; Nasal Swab  Result Value Ref Range Status   MRSA by PCR Next Gen NOT DETECTED NOT DETECTED Final    Comment: (NOTE) The GeneXpert MRSA Assay (FDA approved for NASAL specimens only), is one component of a comprehensive MRSA colonization surveillance program. It is not intended to diagnose MRSA infection nor to guide or monitor treatment for MRSA infections. Test performance is not FDA approved in patients less than 66 years old. Performed at [facility], [address], [place],  [address]          Radiology Studies: ECHOCARDIOGRAM LIMITED  Result Date: 08/26/2021    ECHOCARDIOGRAM LIMITED REPORT    Patient Name:   [name] Date of Exam: 08/26/2021 Medical Rec #:  [id number]       Height:       63.0 in Accession #:    [id number]      Weight:       142.2 lb Date of Birth:  04/26/1945        BSA:          1.673 m Patient Age:    40 years        BP:           117/57 mmHg Patient Gender: F               HR:           64 bpm. Exam Location:  [facility] Procedure: Limited Echo and Intracardiac Opacification Agent Indications:    Chest Pain  History:        Patient has prior history of Echocardiogram examinations, most                 recent 08/03/2021. CAD and Previous Myocardial Infarction,                 Signs/Symptoms:Chest Pain and Altered Mental Status; Risk                 Factors:Dyslipidemia and Current Smoker.  Sonographer:    Wenda Low Referring Phys: [id number] [place]  1. Distal apical hypokinesis. No thrombus noted. . Left ventricular ejection fraction, by estimation, is 60 to 65%. The left ventricle has normal function. There is mild left ventricular hypertrophy.  2. Right ventricular systolic function is normal. The right ventricular size is normal.  3. Limited echo to evaluate LV function. Much improved LVEF compared to 08/03/21 study. FINDINGS  Left Ventricle: Distal apical hypokinesis. No thrombus noted. Left ventricular ejection fraction, by estimation, is 60 to 65%. The left ventricle has normal function. Definity contrast agent was given IV to delineate the left ventricular endocardial borders. There is mild left ventricular hypertrophy. Right Ventricle: The right ventricular size is normal. Right vetricular wall thickness was not well visualized. Right ventricular systolic function is normal. Pericardium: There is no evidence of pericardial effusion. Venous: The inferior vena cava was not well visualized. LEFT VENTRICLE PLAX 2D LVIDd:         4.25 cm LVIDs:         2.90 cm LV PW:         1.25 cm LV IVS:        1.05 cm LVOT diam:     2.00 cm LVOT Area:     3.14 cm  LV  Volumes (MOD) LV vol d, MOD A2C: 43.7 ml LV vol d, MOD A4C: 48.6 ml LV vol s, MOD A2C: 15.1 ml LV vol s, MOD A4C: 17.9 ml LV SV MOD A2C:     28.6 ml LV SV MOD A4C:     48.6 ml LV SV MOD BP:      29.9 ml RIGHT VENTRICLE RV Basal diam:  3.10 cm RV Mid diam:    2.70 cm TAPSE (M-mode): 2.6 cm LEFT ATRIUM             Index        RIGHT ATRIUM           Index LA diam:        4.50 cm 2.69 cm/m   RA Area:     13.10 cm LA Vol (A2C):   79.0 ml 47.23 ml/m  RA Volume:   29.30 ml  17.52 ml/m LA Vol (A4C):   56.7 ml 33.90 ml/m LA Biplane Vol: 66.9 ml 40.00 ml/m   AORTA Ao Root diam: 3.30 cm  SHUNTS Systemic Diam: 2.00 cm Carlyle Dolly MD Electronically signed by Carlyle Dolly MD Signature Date/Time: 08/26/2021/3:46:02 PM    Final    DG Chest Port 1 View  Result Date: 08/26/2021 CLINICAL DATA:  Chest pain EXAM: PORTABLE CHEST 1 VIEW COMPARISON:  08/03/2021 FINDINGS: Heart and mediastinal contours are within normal limits. No focal opacities or effusions. No acute bony abnormality. Aortic atherosclerosis. IMPRESSION: No active cardiopulmonary disease. Electronically Signed   By: Rolm Baptise M.D.   On: 08/26/2021 00:15        Scheduled Meds:  apixaban  5 mg Oral BID   aspirin EC  81 mg Oral Daily   atorvastatin  80 mg Oral QHS   Chlorhexidine Gluconate Cloth  6 each Topical Q0600   clopidogrel  75 mg Oral Daily   dapagliflozin propanediol  10 mg Oral Daily   DULoxetine  30 mg Oral TID   gabapentin  400 mg Oral TID   melatonin  6 mg Oral QHS   midodrine  5 mg Oral TID WC   polyethylene glycol  17 g Oral Daily   Continuous Infusions:  sodium chloride     norepinephrine (LEVOPHED) Adult infusion Stopped (08/26/21 1723)     LOS: 1 day    Time spent: 35 minutes    Tahjai Schetter Darleen Crocker, DO [facility]  If 7PM-7AM, please contact night-coverage [email or web address] 08/27/2021, 12:16 PM

## 2021-08-27 NOTE — Progress Notes (Signed)
Patient called out for pain medication. When this nurse went into patient room with PRN tylenol, patient was very verbal about how "pissed off" she is because lab woke her up to draw her morning labs. The patient became more upset when she was offered the ordered pain medication, and began to curse at this nurse. I asked patient not to curse at me and attempted to reason with patient about her frustration with previous situation of being woken up, patient responeded "just give me the damn medicine and I dont want to hear from you anymore"

## 2021-08-27 NOTE — Care Management Important Message (Signed)
Important Message  Patient Details  Name: [name] MRN: [id number] Date of Birth: [birth year]   Medicare Important Message Given:  N/A - LOS <3 / Initial given by admissions     Tommy Medal 08/27/2021, 10:48 AM

## 2021-08-28 DIAGNOSIS — R079 Chest pain, unspecified: Secondary | ICD-10-CM | POA: Diagnosis not present

## 2021-08-28 LAB — BASIC METABOLIC PANEL
Anion gap: 6 (ref 5–15)
BUN: 19 mg/dL (ref 8–23)
CO2: 26 mmol/L (ref 22–32)
Calcium: 8.8 mg/dL — ABNORMAL LOW (ref 8.9–10.3)
Chloride: 109 mmol/L (ref 98–111)
Creatinine, Ser: 0.85 mg/dL (ref 0.44–1.00)
GFR, Estimated: >60 mL/min (ref >60)
Glucose, Bld: 90 mg/dL (ref 70–99)
Potassium: 3.6 mmol/L (ref 3.5–5.1)
Sodium: 141 mmol/L (ref 135–145)

## 2021-08-28 LAB — MAGNESIUM: Magnesium: 1.9 mg/dL (ref 1.7–2.4)

## 2021-08-28 LAB — CBC
HCT: 30 % — ABNORMAL LOW (ref 36.0–46.0)
Hemoglobin: 9.8 g/dL — ABNORMAL LOW (ref 12.0–15.0)
MCH: 30.2 pg (ref 26.0–34.0)
MCHC: 32.7 g/dL (ref 30.0–36.0)
MCV: 92.6 fL (ref 80.0–100.0)
Platelets: 148 10*3/uL — ABNORMAL LOW (ref 150–400)
RBC: 3.24 MIL/uL — ABNORMAL LOW (ref 3.87–5.11)
RDW: 13.9 % (ref 11.5–15.5)
WBC: 5 10*3/uL (ref 4.0–10.5)
nRBC: 0 % (ref 0.0–0.2)

## 2021-08-28 LAB — OCCULT BLOOD X 1 CARD TO LAB, STOOL: Fecal Occult Bld: NEGATIVE

## 2021-08-28 MED ORDER — ASPIRIN 81 MG PO TBEC: 81.0000 mg | DELAYED_RELEASE_TABLET | Freq: Every day | ORAL | 0 refills | Status: AC

## 2021-08-28 NOTE — Progress Notes (Addendum)
Progress Note  Patient Name: [name] Date of Encounter: 08/28/2021  [facility] Cardiologist: Evaluated by Dr. Percival Spanish during last admission; Scheduled to see Cardiology at [place] next month but wishes to follow-up with [facility] in [place], [place]   Subjective   No dyspnea, orthopnea or PND. No recurrent chest pain. She does report having very dark stool yesterday evening which was abnormal for her.   Inpatient Medications    Scheduled Meds:  apixaban  5 mg Oral BID   aspirin EC  81 mg Oral Daily   atorvastatin  80 mg Oral QHS   clopidogrel  75 mg Oral Daily   dapagliflozin propanediol  10 mg Oral Daily   DULoxetine  30 mg Oral TID   gabapentin  400 mg Oral TID   melatonin  6 mg Oral QHS   midodrine  5 mg Oral TID WC   polyethylene glycol  17 g Oral Daily   Continuous Infusions:  sodium chloride     norepinephrine (LEVOPHED) Adult infusion Stopped (08/26/21 1723)   PRN Meds: acetaminophen, ALPRAZolam, ondansetron (ZOFRAN) IV, mouth rinse   Vital Signs    Vitals:   08/27/21 1500 08/27/21 2004 08/27/21 2308 08/28/21 0423  BP: (!) 139/54 (!) 121/52 (!) 151/71 (!) 133/57  Pulse: (!) 52 (!) 55 60 60  Resp: '17 16 18 15  '$ Temp: 98.4 F (36.9 C) 97.6 F (36.4 C) 98.3 F (36.8 C) 97.7 F (36.5 C)  TempSrc: Oral     SpO2: 100% 100% 100% 97%  Weight:      Height:        Intake/Output Summary (Last 24 hours) at 08/28/2021 0748 Last data filed at 08/28/2021 0500 Gross per 24 hour  Intake 480 ml  Output 900 ml  Net -420 ml      08/27/2021    5:00 AM 08/26/2021    3:43 PM 08/26/2021   12:42 AM  Last 3 Weights  Weight (lbs) 147 lb 0.8 oz 147 lb 0.8 oz 142 lb 3.2 oz  Weight (kg) 66.7 kg 66.7 kg 64.5 kg      Telemetry    Sinus bradycardia, HR in 50's. Peaking in the 60's.  - Personally Reviewed  ECG    No new tracings.   Physical Exam   GEN: Pleasant female appearing in no acute distress.   Neck: No JVD Cardiac: RRR, no murmurs, rubs, or gallops.   Respiratory: Clear to auscultation bilaterally without wheezing or rales. GI: Soft, nontender, non-distended  MS: No pitting edema; No deformity. Neuro:  Nonfocal  Psych: Normal affect   Labs    High Sensitivity Troponin:   Recent Labs  Lab 08/02/21 2135 08/03/21 0033 08/25/21 2038 08/25/21 2210 08/26/21 0356  TROPONINIHS 78* 139* '13 12 12     '$ Chemistry Recent Labs  Lab 08/26/21 0356 08/27/21 0410 08/28/21 0438  NA 139 140 141  K 3.6 3.4* 3.6  CL 103 107 109  CO2 '28 27 26  '$ GLUCOSE 94 95 90  BUN 32* 20 19  CREATININE 1.20* 0.83 0.85  CALCIUM 8.8* 8.6* 8.8*  MG 2.1  --  1.9  PROT 5.7* 5.2*  --   ALBUMIN 3.3* 3.4*  --   AST 18 14*  --   ALT 13 12  --   ALKPHOS 38 30*  --   BILITOT 0.3 0.4  --   GFRNONAA 47* >60 >60  ANIONGAP '8 6 6    '$ Lipids No results for input(s): "CHOL", "TRIG", "HDL", "LABVLDL", "  [employer]", "CHOLHDL" in the last 168 hours.  Hematology Recent Labs  Lab 08/26/21 0356 08/26/21 1254 08/27/21 0410 08/28/21 0438  WBC 4.3  --  4.8 5.0  RBC 3.49* 3.25* 3.30* 3.24*  HGB 10.6* 10.1* 10.0* 9.8*  HCT 32.5* 31.4* 30.6* 30.0*  MCV 93.1  --  92.7 92.6  MCH 30.4  --  30.3 30.2  MCHC 32.6  --  32.7 32.7  RDW 14.2  --  14.0 13.9  PLT 163  --  147* 148*   Thyroid No results for input(s): "TSH", "FREET4" in the last 168 hours.  BNPNo results for input(s): "BNP", "PROBNP" in the last 168 hours.  DDimer No results for input(s): "DDIMER" in the last 168 hours.    Cardiac Studies   Limited Echo: 08/26/2021 IMPRESSIONS     1. Distal apical hypokinesis. No thrombus noted. . Left ventricular  ejection fraction, by estimation, is 60 to 65%. The left ventricle has  normal function. There is mild left ventricular hypertrophy.   2. Right ventricular systolic function is normal. The right ventricular  size is normal.   3. Limited echo to evaluate LV function. Much improved LVEF compared to  08/03/21 study.   Patient Profile     76 y.o. female w/ PMH of  CAD (s/p cath in 07/2021 showing severe multivessel CAD and not felt to have viable myocardium for CABG --> s/p DES x2 to LAD), HFrEF (EF 30-35% by echo in 06//05/2021) HTN, HLD non-Hodgkin's lymphoma (in remission) and severe spinal stenosis who is currently admitted for evaluation of chest pain.   Assessment & Plan    1. Chest Pain/CAD - Recent DESx2 to the LAD earlier this month as not felt to be a candidate for CABG given poor myocardial viability and the patient says she would have not wanted to proceed with CABG. Presented with chest pain which improved with SL NTG and spontaneously resolved the evening of admission.  - Hs troponin values have been negative this admission and her EKG shows chronic ST changes but no acute findings. Echo shows her EF has now normalized.  - Continue ASA, Plavix and Atorvastatin. No longer on beta-blocker therapy due to bradycardia and hypotension. Previously recommended to continue ASA for 30-days so would stop on 09/07/2021. Once she has completed 3 months of Eliquis for her apical thrombus, would likely need to restart ASA at that time and continue Plavix.    2. Hypotension - Felt to be secondary to dehydration. Has been off pressor support and Midodrine was reduced from '10mg'$  TID to '5mg'$  TID. As previously recommended, will plan to stop Midodrine as recommended yesterday since BP has been stable, at 121/52 - 151/71 within the past 24 hours.   3. HFrEF - Echo earlier this month showed her EF was reduced at 30-35% and has now normalized to 60-65% when rechecked this admission. She was on Coreg, Toprol-XL (?- unclear if taking this and Coreg), Farxiga, Entresto, Spironolactone and Chlorthalidone prior to admission. Beta-blocker therapy has been discontinued given her bradycardia and hypotension. No longer on Mearl Latin or Chlorthalidone. She has been restarted on Farxiga '10mg'$  daily.    4. Apical Thrombus - Noted on cMRI during her recent admission. Echo this  admission shows distal apical HK but no definitive thrombus. She has been on Eliquis '5mg'$  BID instead of Coumadin given the need for triple therapy. Dr. Harl Bowie recommended being on anticoagulation for 3 months.    5. AKI - Creatinine elevated to 1.62 on admission and  AKI now resolved with IV fluids and creatinine at 0.85 this AM.    6. Anemia - Hgb at 12.4  - 15.2 earlier this month, at 11.6 on admission and at 9.8 today. Undergoing workup by the admitting team. Ferritin is low-normal at 29 so may benefit from iron supplementation. She does report having one episode of melena and will check a stool card today as she is on ASA, Plavix and Eliquis.     Will arrange for outpatient Cardiology follow-up in [facility]. First visit will be at the [place] office given availability and she will follow-up in [place] afterwards.    For questions or updates, please contact [place] Please consult [email or web address] for contact info under        Signed, Erma Heritage, PA-C  08/28/2021, 7:48 AM

## 2021-08-28 NOTE — Discharge Summary (Signed)
Physician Discharge Summary  [name] IOX:[id number] DOB: [birth year] DOA: 08/25/2021  PCP: Karleen Hampshire., MD  Admit date: 08/25/2021  Discharge date: 08/28/2021  Admitted From:Home  Disposition:  Home  Recommendations for Outpatient Follow-up:  Follow up with PCP in 1-2 weeks Follow-up with cardiology as scheduled on 7/7 Entresto, spironolactone, chlorthalidone, beta-blockers, and midodrine discontinued Remain on anticoagulation with Eliquis, aspirin, and Plavix as prescribed with aspirin to be discontinued 7/9. No further need for midodrine noted  Home Health: None  Equipment/Devices: None  Discharge Condition:Stable  CODE STATUS: Partial code, DNI  Diet recommendation: Heart Healthy  Brief/Interim Summary: [name] is a 76 y.o. female with medical history significant of coronary artery disease, hypertension, depression/anxiety, hyperlipidemia, tobacco use disorder, and more presents the ED with a chief complaint of chest pain.  There was no sign of ACS noted and cardiology has been following with assistance in medication management.  She was briefly noted to have an episode of hypotension requiring ICU stay with brief use of vasopressors, but this was improved after IV fluid hydration.  Her blood pressures have remained stable while on midodrine during the hospitalization she no longer requires this medication on discharge.  Adjustments to her medications are as noted above per cardiology with close follow-up as noted on 7/7.  She was noted to have 1 dark bowel movement on 6/28 and stool occult on 6/29 was noted to be negative.  Hemoglobin and hematocrit levels have remained stable and she is in stable condition for discharge today.  Discharge Diagnoses:  Principal Problem:   Chest pain Active Problems:   Hypotensive episode   Hypoalbuminemia   Atherosclerosis of coronary artery bypass graft of native heart without angina pectoris   Essential hypertension   GERD  (gastroesophageal reflux disease)   Tobacco use disorder   Prediabetes   AKI (acute kidney injury) ([place])   Chronic systolic CHF (congestive heart failure) ([place])  Principal discharge diagnosis: Atypical chest pain with no ACS.  Hypotension related to dehydration-resolved.  AKI-resolved.  Discharge Instructions  Discharge Instructions     Diet - low sodium heart healthy   Complete by: As directed    Increase activity slowly   Complete by: As directed       Allergies as of 08/28/2021       Reactions   Morphine Rash        Medication List     STOP taking these medications    carvedilol 3.125 MG tablet Commonly known as: COREG   chlorthalidone 25 MG tablet Commonly known as: HYGROTON   Entresto 24-26 MG Generic drug: sacubitril-valsartan   spironolactone 25 MG tablet Commonly known as: Aldactone       TAKE these medications    ALPRAZolam 0.5 MG tablet Commonly known as: XANAX Take 0.5 mg by mouth 2 (two) times daily as needed.   apixaban 5 MG Tabs tablet Commonly known as: ELIQUIS Take 1 tablet (5 mg total) by mouth 2 (two) times daily.   aspirin EC 81 MG tablet Take 1 tablet (81 mg total) by mouth daily for 10 days. Swallow whole.   atorvastatin 80 MG tablet Commonly known as: LIPITOR Take 1 tablet (80 mg total) by mouth at bedtime.   bisacodyl 10 MG suppository Commonly known as: DULCOLAX Place 1 suppository (10 mg total) rectally daily as needed for moderate constipation.   clopidogrel 75 MG tablet Commonly known as: PLAVIX Take 1 tablet (75 mg total) by mouth daily.   cyanocobalamin 1000  MCG/ML injection Commonly known as: (VITAMIN B-12) Inject 1 mcg into the muscle every 30 (thirty) days.   dapagliflozin propanediol 10 MG Tabs tablet Commonly known as: FARXIGA Take 1 tablet (10 mg total) by mouth daily.   diclofenac Sodium 1 % Gel Commonly known as: VOLTAREN Apply 2 g topically 4 (four) times daily as needed (pain).   DULoxetine 30 MG  capsule Commonly known as: CYMBALTA Take 30 mg by mouth 3 (three) times daily.   fluocinonide gel 0.05 % Commonly known as: LIDEX   fluticasone 50 MCG/ACT nasal spray Commonly known as: FLONASE Place 2 sprays into both nostrils daily as needed for allergies.   gabapentin 800 MG tablet Commonly known as: NEURONTIN Take 800 mg by mouth 3 (three) times daily.   melatonin 5 MG Tabs Take 1 tablet (5 mg total) by mouth at bedtime as needed. What changed: how much to take   nitroGLYCERIN 0.4 MG SL tablet Commonly known as: NITROSTAT Place 1 tablet (0.4 mg total) under the tongue every 5 (five) minutes x 3 doses as needed for chest pain.   oxycodone 30 MG immediate release tablet Commonly known as: ROXICODONE Take 30 mg by mouth 3 (three) times daily as needed for pain.   Oyster Shell Calcium w/D 500-5 MG-MCG Tabs Take by mouth.   pantoprazole 40 MG tablet Commonly known as: PROTONIX Take 40 mg by mouth daily.        Follow-up Information     Charlie Pitter, PA-C Follow up on 09/05/2021.   Specialties: Cardiology, Radiology Why: Cardiology Hospital Follow-up on 09/05/2021 at 2:30 PM at the [facility] (attached to [facility]) Contact information: [place] [address] [phone]         Karleen Hampshire., MD. Schedule an appointment as soon as possible for a visit in 2 week(s).   Specialty: Internal Medicine Contact information: [address] [place] [address] [phone]                Allergies  Allergen Reactions   Morphine Rash    Consultations: Cardiology   Procedures/Studies: ECHOCARDIOGRAM LIMITED  Result Date: 08/26/2021    ECHOCARDIOGRAM LIMITED REPORT   Patient Name:   [name] Date of Exam: 08/26/2021 Medical Rec #:  [id number]       Height:       63.0 in Accession #:    [id number]      Weight:       142.2 lb Date of Birth:  Oct 28, 1945        BSA:          1.673 m Patient Age:    42 years         BP:           117/57 mmHg Patient Gender: F               HR:           64 bpm. Exam Location:  [facility] Procedure: Limited Echo and Intracardiac Opacification Agent Indications:    Chest Pain  History:        Patient has prior history of Echocardiogram examinations, most                 recent 08/03/2021. CAD and Previous Myocardial Infarction,                 Signs/Symptoms:Chest Pain and Altered Mental Status; Risk  Factors:Dyslipidemia and Current Smoker.  Sonographer:    Wenda Low Referring Phys: [id number] [place]  1. Distal apical hypokinesis. No thrombus noted. . Left ventricular ejection fraction, by estimation, is 60 to 65%. The left ventricle has normal function. There is mild left ventricular hypertrophy.  2. Right ventricular systolic function is normal. The right ventricular size is normal.  3. Limited echo to evaluate LV function. Much improved LVEF compared to 08/03/21 study. FINDINGS  Left Ventricle: Distal apical hypokinesis. No thrombus noted. Left ventricular ejection fraction, by estimation, is 60 to 65%. The left ventricle has normal function. Definity contrast agent was given IV to delineate the left ventricular endocardial borders. There is mild left ventricular hypertrophy. Right Ventricle: The right ventricular size is normal. Right vetricular wall thickness was not well visualized. Right ventricular systolic function is normal. Pericardium: There is no evidence of pericardial effusion. Venous: The inferior vena cava was not well visualized. LEFT VENTRICLE PLAX 2D LVIDd:         4.25 cm LVIDs:         2.90 cm LV PW:         1.25 cm LV IVS:        1.05 cm LVOT diam:     2.00 cm LVOT Area:     3.14 cm  LV Volumes (MOD) LV vol d, MOD A2C: 43.7 ml LV vol d, MOD A4C: 48.6 ml LV vol s, MOD A2C: 15.1 ml LV vol s, MOD A4C: 17.9 ml LV SV MOD A2C:     28.6 ml LV SV MOD A4C:     48.6 ml LV SV MOD BP:      29.9 ml RIGHT VENTRICLE RV Basal diam:  3.10 cm RV Mid  diam:    2.70 cm TAPSE (M-mode): 2.6 cm LEFT ATRIUM             Index        RIGHT ATRIUM           Index LA diam:        4.50 cm 2.69 cm/m   RA Area:     13.10 cm LA Vol (A2C):   79.0 ml 47.23 ml/m  RA Volume:   29.30 ml  17.52 ml/m LA Vol (A4C):   56.7 ml 33.90 ml/m LA Biplane Vol: 66.9 ml 40.00 ml/m   AORTA Ao Root diam: 3.30 cm  SHUNTS Systemic Diam: 2.00 cm Carlyle Dolly MD Electronically signed by Carlyle Dolly MD Signature Date/Time: 08/26/2021/3:46:02 PM    Final    DG Chest Port 1 View  Result Date: 08/26/2021 CLINICAL DATA:  Chest pain EXAM: PORTABLE CHEST 1 VIEW COMPARISON:  08/03/2021 FINDINGS: Heart and mediastinal contours are within normal limits. No focal opacities or effusions. No acute bony abnormality. Aortic atherosclerosis. IMPRESSION: No active cardiopulmonary disease. Electronically Signed   By: Rolm Baptise M.D.   On: 08/26/2021 00:15   CARDIAC CATHETERIZATION  Result Date: 08/08/2021   Mid LAD-1 lesion is 80% stenosed.   Mid LAD-2 lesion is 90% stenosed.   Post intervention, there is a 0% residual stenosis.   Post intervention, there is a 0% residual stenosis. Successful PCI of the proximal and mid LAD tandem stenoses using a 3.0 x 20 mm Synergy DES over the proximal lesion and a 2.25 x 32 mm Synergy DES over the distal lesions.  The procedure is guided by intravascular ultrasound. Recommendations: Dual antiplatelet therapy with aspirin and clopidogrel x12 months without interruption.  Aggressive risk  reduction measures and anti-ischemic therapy for residual ischemic heart disease.   MR CARDIAC MORPHOLOGY W WO CONTRAST  Addendum Date: 08/05/2021   ADDENDUM REPORT: 08/05/2021 10:33 ADDENDUM: Reviewed CMR with Primary MD. Though there is some evidence of patchy LGE at the basal septum in the PSAX view, there is no transmural LGE in the mid or apex. LAD territory shows evidence of viability. Electronically Signed   By: Rudean Haskell M.D.   On: 08/05/2021 10:33    Result Date: 08/05/2021 CLINICAL DATA:  Clinical question of cardiac viability Study assumes HCT of 42 and BSA of 1.74 EXAM: CARDIAC MRI TECHNIQUE: The patient was scanned on a 1.5 Tesla GE magnet. A dedicated cardiac coil was used. Functional imaging was done using Fiesta sequences. 2,3, and 4 chamber views were done to assess for RWMA's. Modified Simpson's rule using a short axis stack was used to calculate an ejection fraction on a dedicated work Conservation officer, nature. The patient received 10 cc of Gadavist. After 10 minutes inversion recovery sequences were used to assess for infiltration and scar tissue. CONTRAST:  10 cc  of Gadavist FINDINGS: 1. Normal left ventricular size, with LVEDD 39 mm, and LVEDVi 60 mL/m2. Moderate asymmetric septal hypertrophy, with intraventricular septal thickness of 14 mm, posterior wall thickness of 8 mm, and myocardial mass index mildly increased at 66 g/m2. Normal left ventricular systolic function (LVEF =10%). Global hypokinesis with hypokinesis in the basal, anteroseptal and severe hypokinesis mid and apex. Notably, mid inferior, inferoseptal and apical inferior are akinetic. There is a small LV apical thrombus. There is no late gadolinium enhancement in the left ventricular myocardium. 2. Normal right ventricular size with RVEDVI 29 mL/m2. Normal right ventricular thickness. Normal right ventricular systolic function (RVEF =17%). There are no regional wall motion abnormalities or aneurysms. 3.  Normal left and right atrial size. 4.  Normal size of the pulmonary artery. Ascending aorta is at the upper limit of normal for age and BSA, 39 mm. 5. Valve assessment: Aortic Valve: Not well visualized. Qualitatively, there is no significant regurgitation. Pulmonic Valve: Qualitatively, there is no significant regurgitation. Tricuspid Valve: Qualitatively, there is no significant regurgitation. Mitral Valve: Abnormally thickened with posterior leaflet restriction. There is  no significant regurgitation. 6. Normal pericardium. No pericardial effusion. Prominent pericardial fat. 7. Grossly, no additional extracardiac findings, outside of bilateral pleural effusions. Recommended dedicated study if concerned for non-cardiac pathology. IMPRESSION: No evidence of late gadolinium enhancement-suggestive of viable myocardium. Small LV thrombus. Rudean Haskell MD Electronically Signed: By: Rudean Haskell M.D. On: 08/04/2021 13:50   ECHOCARDIOGRAM COMPLETE  Result Date: 08/03/2021    ECHOCARDIOGRAM REPORT   Patient Name:   [name] Date of Exam: 08/03/2021 Medical Rec #:  [id number]       Height:       63.0 in Accession #:    [id number]      Weight:       150.0 lb Date of Birth:  November 25, 1945        BSA:          28.711 m Patient Age:    61 years        BP:           120/66 mmHg Patient Gender: F               HR:           70 bpm. Exam Location:  Inpatient Procedure: 2D Echo, 3D Echo, Cardiac Doppler, Color  Doppler and Intracardiac            Opacification Agent Indications:    122-I22.9 Subsequent ST elevation (STEM) and non-ST elevation                 (NSTEMI) myocardial infarction  History:        Patient has no prior history of Echocardiogram examinations.                 Acute MI and CAD, Signs/Symptoms:Chest Pain and Altered Mental                 Status; Risk Factors:Current Smoker and Dyslipidemia.  Sonographer:    Roseanna Rainbow RDCS Referring Phys: [place]  1. Left ventricular ejection fraction, by estimation, is 30 to 35%. The left ventricle has moderately decreased function. The left ventricle demonstrates regional wall motion abnormalities (see scoring diagram/findings for description). There is moderate asymmetric left ventricular hypertrophy of the basal-septal segment. Left ventricular diastolic parameters are consistent with Grade I diastolic dysfunction (impaired relaxation).  2. LV apical mural thrombus  3. Right ventricular systolic  function is normal. The right ventricular size is normal. Tricuspid regurgitation signal is inadequate for assessing PA pressure.  4. The mitral valve is degenerative. Trivial mitral valve regurgitation. No evidence of mitral stenosis. Moderate mitral annular calcification.  5. The aortic valve was not well visualized. Aortic valve regurgitation is trivial. No aortic stenosis is present.  6. The inferior vena cava is normal in size with greater than 50% respiratory variability, suggesting right atrial pressure of 3 mmHg. FINDINGS  Left Ventricle: Left ventricular ejection fraction, by estimation, is 30 to 35%. The left ventricle has moderately decreased function. The left ventricle demonstrates regional wall motion abnormalities. Definity contrast agent was given IV to delineate the left ventricular endocardial borders. The left ventricular internal cavity size was normal in size. There is moderate asymmetric left ventricular hypertrophy of the basal-septal segment. Left ventricular diastolic parameters are consistent with Grade  I diastolic dysfunction (impaired relaxation).  LV Wall Scoring: The mid and distal anterior septum, entire apex, and mid inferoseptal segment are akinetic. The mid inferolateral segment, mid anterolateral segment, mid anterior segment, and mid inferior segment are hypokinetic. The basal anteroseptal segment, basal inferolateral segment, basal anterolateral segment, basal anterior segment, basal inferior segment, and basal inferoseptal segment are normal. Right Ventricle: The right ventricular size is normal. No increase in right ventricular wall thickness. Right ventricular systolic function is normal. Tricuspid regurgitation signal is inadequate for assessing PA pressure. Left Atrium: Left atrial size was normal in size. Right Atrium: Right atrial size was normal in size. Pericardium: There is no evidence of pericardial effusion. Mitral Valve: The mitral valve is degenerative in  appearance. Moderate mitral annular calcification. Trivial mitral valve regurgitation. No evidence of mitral valve stenosis. Tricuspid Valve: The tricuspid valve is normal in structure. Tricuspid valve regurgitation is trivial. Aortic Valve: The aortic valve was not well visualized. Aortic valve regurgitation is trivial. No aortic stenosis is present. Pulmonic Valve: The pulmonic valve was not well visualized. Pulmonic valve regurgitation is not visualized. Aorta: The aortic root and ascending aorta are structurally normal, with no evidence of dilitation. Venous: The inferior vena cava is normal in size with greater than 50% respiratory variability, suggesting right atrial pressure of 3 mmHg. IAS/Shunts: The interatrial septum was not well visualized.  LEFT VENTRICLE PLAX 2D LVIDd:         5.00 cm     Diastology  LVIDs:         4.10 cm     LV e' medial:    3.23 cm/s LV PW:         1.10 cm     LV E/e' medial:  18.0 LV IVS:        0.80 cm     LV e' lateral:   6.23 cm/s LVOT diam:     1.80 cm     LV E/e' lateral: 9.3 LV SV:         49 LV SV Index:   28 LVOT Area:     2.54 cm                             3D Volume EF: LV Volumes (MOD)           3D EF:        34 % LV vol d, MOD A2C: 81.9 ml LV EDV:       92 ml LV vol d, MOD A4C: 90.8 ml LV ESV:       61 ml LV vol s, MOD A2C: 67.6 ml LV SV:        31 ml LV vol s, MOD A4C: 65.2 ml LV SV MOD A2C:     14.3 ml LV SV MOD A4C:     90.8 ml LV SV MOD BP:      18.6 ml RIGHT VENTRICLE             IVC RV S prime:     10.60 cm/s  IVC diam: 1.50 cm TAPSE (M-mode): 1.7 cm LEFT ATRIUM             Index        RIGHT ATRIUM          Index LA Vol (A2C):   31.2 ml 18.23 ml/m  RA Area:     9.36 cm LA Vol (A4C):   39.1 ml 22.85 ml/m  RA Volume:   18.90 ml 11.05 ml/m LA Biplane Vol: 35.5 ml 20.75 ml/m  AORTIC VALVE LVOT Vmax:   96.40 cm/s LVOT Vmean:  61.300 cm/s LVOT VTI:    0.191 m  AORTA Ao Root diam: 3.10 cm Ao Asc diam:  3.50 cm MITRAL VALVE MV Area (PHT): 3.53 cm    SHUNTS MV Decel  Time: 215 msec    Systemic VTI:  0.19 m MV E velocity: 58.20 cm/s  Systemic Diam: 1.80 cm MV A velocity: 96.40 cm/s MV E/A ratio:  0.60 Oswaldo Milian MD Electronically signed by Oswaldo Milian MD Signature Date/Time: 08/03/2021/1:23:48 PM    Final    DG CHEST PORT 1 VIEW  Result Date: 08/03/2021 CLINICAL DATA:  Evaluate atelectasis EXAM: PORTABLE CHEST 1 VIEW COMPARISON:  Prior chest x-ray yesterday 08/02/2021 FINDINGS: External defibrillator pads project over the left chest. Cardiac and mediastinal contours are stable. Atherosclerotic calcifications in the transverse aorta. Significantly improved pulmonary edema. Probable trace bilateral pleural effusions and associated atelectasis. The amount of atelectasis has improved. No pneumothorax. No acute osseous abnormality. IMPRESSION: 1. Interval resolution of pulmonary edema with significant improvement in aeration. 2. Probable trace bilateral pleural effusions and associated bibasilar atelectasis. Electronically Signed   By: Jacqulynn Cadet M.D.   On: 08/03/2021 07:24   DG CHEST PORT 1 VIEW  Result Date: 08/02/2021 CLINICAL DATA:  Pulmonary edema. EXAM: PORTABLE CHEST 1 VIEW COMPARISON:  August 02, 2021 (7:11 p.m.) FINDINGS: Low lung volumes  are seen with diffusely increased interstitial lung markings. Mild atelectasis and/or infiltrate is also seen within the bilateral lung bases. This is mildly increased in severity when compared to the prior study. No pleural effusion or pneumothorax is identified. The cardiac silhouette is within the upper limits of normal and stable in size. A radiopaque fusion plate and screws are seen overlying the lower cervical spine. Multilevel degenerative changes are seen throughout the thoracic spine. IMPRESSION: 1. Low lung volumes with mild, stable interstitial edema. 2. Mild bibasilar atelectasis, mildly increased in severity when compared to the prior study. Electronically Signed   By: Virgina Norfolk M.D.   On:  08/02/2021 22:55   CARDIAC CATHETERIZATION  Result Date: 08/02/2021 Conclusions: Severe three-vessel coronary artery disease including sequential 80-90% proximal and mid LAD stenoses, 70% proximal ramus intermedius lesion, multifocal LCx disease of up to 90% in the mid/distal vessel, and diffusely diseased proximal/mid RCA with up to 50% stenosis as well as sequential 90% ostial and 70% mid RPDA lesions. Moderately-severely elevated left ventricular filling pressure (LVEDP 30-35 mmHg).  LVEF suboptimally assessed but is likely mildly-moderately reduced with anterior hypokinesis. Recommendations: Case discussed with Dr. Cyndia Bent (cardiac surgery).  There is not a clear culprit for the patient's EKG changes.  I suspect that she may have had an ischemic event several days ago with her transient chest pain and is now manifesting predominantly heart failure symptoms.  We will optimize her heart failure while discussing optimal revascularization strategy. Patient given furosemide 40 mg IV at the end of the procedure.  Continue diuresis as blood pressure and renal function tolerate. Hold home doses of metoprolol and lisinopril for now.  Reinstitute goal-directed medical therapy as tolerated based on blood pressure, renal function, and heart failure. Obtain echocardiogram. Restart IV heparin 2 hours after TR band removal.  Defer adding P2Y12 inhibitor pending cardiac surgery consultation. Aggressive secondary prevention of coronary artery disease. Nelva Bush, MD [facility]  DG Chest Port 1 View  Result Date: 08/02/2021 CLINICAL DATA:  Shortness of breath beginning 3 nights ago. EXAM: PORTABLE CHEST 1 VIEW COMPARISON:  02/12/2021 FINDINGS: Artifact overlies the chest. Heart size upper limits of normal. Aortic atherosclerosis is present. There is mild interstitial edema and small pleural effusions, suggesting congestive heart failure. Volume loss at the lung bases secondary to the effusions. No acute bone  finding. IMPRESSION: Interstitial edema. Bilateral pleural effusions. Basilar volume loss associated with the fusions. Findings consistent with congestive heart failure. Electronically Signed   By: Nelson Chimes M.D.   On: 08/02/2021 19:18     Discharge Exam: Vitals:   08/27/21 2308 08/28/21 0423  BP: (!) 151/71 (!) 133/57  Pulse: 60 60  Resp: 18 15  Temp: 98.3 F (36.8 C) 97.7 F (36.5 C)  SpO2: 100% 97%   Vitals:   08/27/21 1500 08/27/21 2004 08/27/21 2308 08/28/21 0423  BP: (!) 139/54 (!) 121/52 (!) 151/71 (!) 133/57  Pulse: (!) 52 (!) 55 60 60  Resp: '17 16 18 15  '$ Temp: 98.4 F (36.9 C) 97.6 F (36.4 C) 98.3 F (36.8 C) 97.7 F (36.5 C)  TempSrc: Oral     SpO2: 100% 100% 100% 97%  Weight:      Height:        General: Pt is alert, awake, not in acute distress Cardiovascular: RRR, S1/S2 +, no rubs, no gallops Respiratory: CTA bilaterally, no wheezing, no rhonchi Abdominal: Soft, NT, ND, bowel sounds + Extremities: no edema, no cyanosis    The results  of significant diagnostics from this hospitalization (including imaging, microbiology, ancillary and laboratory) are listed below for reference.     Microbiology: Recent Results (from the past 240 hour(s))  MRSA Next Gen by PCR, Nasal     Status: None   Collection Time: 08/26/21  3:40 PM   Specimen: Nasal Mucosa; Nasal Swab  Result Value Ref Range Status   MRSA by PCR Next Gen NOT DETECTED NOT DETECTED Final    Comment: (NOTE) The GeneXpert MRSA Assay (FDA approved for NASAL specimens only), is one component of a comprehensive MRSA colonization surveillance program. It is not intended to diagnose MRSA infection nor to guide or monitor treatment for MRSA infections. Test performance is not FDA approved in patients less than 26 years old. Performed at [facility], [address] [place], [place] [address]      Labs: BNP (last 3 results) Recent Labs    08/05/21 0148  BNP 818.2*   Basic Metabolic  Panel: Recent Labs  Lab 08/25/21 2038 08/26/21 0356 08/27/21 0410 08/28/21 0438  NA 136 139 140 141  K 3.7 3.6 3.4* 3.6  CL 99 103 107 109  CO2 '28 28 27 26  '$ GLUCOSE 187* 94 95 90  BUN 33* 32* 20 19  CREATININE 1.62* 1.20* 0.83 0.85  CALCIUM 8.9 8.8* 8.6* 8.8*  MG  --  2.1  --  1.9   Liver Function Tests: Recent Labs  Lab 08/26/21 0356 08/27/21 0410  AST 18 14*  ALT 13 12  ALKPHOS 38 30*  BILITOT 0.3 0.4  PROT 5.7* 5.2*  ALBUMIN 3.3* 3.4*   No results for input(s): "LIPASE", "AMYLASE" in the last 168 hours. No results for input(s): "AMMONIA" in the last 168 hours. CBC: Recent Labs  Lab 08/25/21 2038 08/26/21 0356 08/26/21 1254 08/27/21 0410 08/28/21 0438  WBC 5.5 4.3  --  4.8 5.0  HGB 11.6* 10.6* 10.1* 10.0* 9.8*  HCT 36.1 32.5* 31.4* 30.6* 30.0*  MCV 94.8 93.1  --  92.7 92.6  PLT 178 163  --  147* 148*   Cardiac Enzymes: No results for input(s): "CKTOTAL", "CKMB", "CKMBINDEX", "TROPONINI" in the last 168 hours. BNP: Invalid input(s): "POCBNP" CBG: No results for input(s): "GLUCAP" in the last 168 hours. D-Dimer No results for input(s): "DDIMER" in the last 72 hours. Hgb A1c No results for input(s): "HGBA1C" in the last 72 hours. Lipid Profile No results for input(s): "CHOL", "HDL", "LDLCALC", "TRIG", "CHOLHDL", "LDLDIRECT" in the last 72 hours. Thyroid function studies No results for input(s): "TSH", "T4TOTAL", "T3FREE", "THYROIDAB" in the last 72 hours.  Invalid input(s): "FREET3" Anemia work up Recent Labs    08/26/21 1254  VITAMINB12 253  FOLATE 10.0  FERRITIN 29  TIBC 273  IRON 66  RETICCTPCT 1.5   Urinalysis No results found for: "COLORURINE", "APPEARANCEUR", "LABSPEC", "PHURINE", "GLUCOSEU", "HGBUR", "BILIRUBINUR", "KETONESUR", "PROTEINUR", "UROBILINOGEN", "NITRITE", "LEUKOCYTESUR" Sepsis Labs Recent Labs  Lab 08/25/21 2038 08/26/21 0356 08/27/21 0410 08/28/21 0438  WBC 5.5 4.3 4.8 5.0   Microbiology Recent Results (from the  past 240 hour(s))  MRSA Next Gen by PCR, Nasal     Status: None   Collection Time: 08/26/21  3:40 PM   Specimen: Nasal Mucosa; Nasal Swab  Result Value Ref Range Status   MRSA by PCR Next Gen NOT DETECTED NOT DETECTED Final    Comment: (NOTE) The GeneXpert MRSA Assay (FDA approved for NASAL specimens only), is one component of a comprehensive MRSA colonization surveillance program. It is not intended to diagnose  MRSA infection nor to guide or monitor treatment for MRSA infections. Test performance is not FDA approved in patients less than 2 years old. Performed at [facility], [address] [place], [place] [address]      Time coordinating discharge: 35 minutes  SIGNED:   Rodena Goldmann, DO [facility] 08/28/2021, 11:27 AM  If 7PM-7AM, please contact night-coverage [email or web address]

## 2021-08-28 NOTE — Progress Notes (Signed)
Rushie Goltz to be D/C'd home per MD order. Discussed with the patient and all questions fully answered.  Skin clean, dry and intact without evidence of skin break down, no evidence of skin tears noted.  IV catheter discontinued intact. Site without signs and symptoms of complications. Dressing and pressure applied.  An After Visit Summary was printed and given to the patient.  Patient escorted via [place], and D/C home via private auto.  Melonie Florida  08/28/2021 4:00 PM

## 2021-09-04 ENCOUNTER — Other Ambulatory Visit: Payer: Self-pay | Admitting: *Deleted

## 2021-09-04 ENCOUNTER — Other Ambulatory Visit: Payer: Self-pay

## 2021-09-04 MED ORDER — ATORVASTATIN CALCIUM 80 MG PO TABS: 80.0000 mg | ORAL_TABLET | Freq: Every day | ORAL | 1 refills | Status: DC

## 2021-09-04 NOTE — Telephone Encounter (Signed)
Carvedilol and losartan refill request denied and faxed to [facility]

## 2021-09-05 ENCOUNTER — Ambulatory Visit: Payer: Medicare Other | Admitting: Physician Assistant

## 2021-09-05 NOTE — Progress Notes (Unsigned)
Cardiology Office Note    Date:  09/05/2021   ID:  [name] 1945/04/02, MRN [id number]   PCP:  Karleen Hampshire., MD   [place]  Cardiologist:  Carlyle Dolly, MD   Advanced Practice Provider:  No care team member to display Electrophysiologist:  None   [phone]   No chief complaint on file.   History of Present Illness:  [name] is a 76 y.o. female  w/ PMH of CAD (s/p cath in 07/2021 showing severe multivessel CAD and not felt to have viable myocardium for CABG --> s/p DES x2 to LAD), HFrEF (EF 30-35% by echo in 06//05/2021) HTN, HLD non-Hodgkin's lymphoma (in remission) and severe spinal stenosis   Patient admitted with recurrent chest pain improved with NTG, Troponins negative and EKG unchanged. F/u echo normal LVEF 60-65% and no thrombus. Beta blocker stopped due to bradycardia. She was dehydrated and midodrine briefly before restarting CHF meds.   Past Medical History:  Diagnosis Date   Arthritis    CAD (coronary artery disease)    a. cath in 07/2021 showing severe multivessel CAD --> not felt to be a CABG candidate and limited viable myocardium --> received DESx2 to LAD   CHF (congestive heart failure) ([place])    a. EF 30-35% by echo in 07/2021, normalized to 60-65% by repeat imaging later that month.   Hx of non-Hodgkin's lymphoma    Hypertension    Left ventricular apical thrombus    a. noted on cMRI in 07/2021    Past Surgical History:  Procedure Laterality Date   CORONARY STENT INTERVENTION N/A 08/08/2021   Procedure: CORONARY STENT INTERVENTION;  Surgeon: Sherren Mocha, MD;  Location: [place] [facility];  Service: Cardiovascular;  Laterality: N/A;   INTRAVASCULAR ULTRASOUND/IVUS N/A 08/08/2021   Procedure: Intravascular Ultrasound/IVUS;  Surgeon: Sherren Mocha, MD;  Location: [place] [facility];  Service: Cardiovascular;  Laterality: N/A;   LEFT HEART CATH AND CORONARY ANGIOGRAPHY N/A 08/02/2021   Procedure: LEFT HEART  CATH AND CORONARY ANGIOGRAPHY;  Surgeon: Nelva Bush, MD;  Location: [place] [facility];  Service: Cardiovascular;  Laterality: N/A;    Current Medications: No outpatient medications have been marked as taking for the 09/08/21 encounter (Appointment) with [name], PA-C.     Allergies:   Morphine   Social History   Socioeconomic History   Marital status: Widowed    Spouse name: Not on file   Number of children: Not on file   Years of education: Not on file   Highest education level: Not on file  Occupational History   Not on file  Tobacco Use   Smoking status: Every Day    Types: Cigarettes   Smokeless tobacco: Never  Substance and Sexual Activity   Alcohol use: Not Currently   Drug use: Never   Sexual activity: Not on file  Other Topics Concern   Not on file  Social History Narrative   Not on file   Social Determinants of Health   Financial Resource Strain: Not on file  Food Insecurity: Not on file  Transportation Needs: Not on file  Physical Activity: Not on file  Stress: Not on file  Social Connections: Not on file     Family History:  The patient's ***family history includes Aortic aneurysm in her mother; Coronary artery disease in her brother.   ROS:   Please see the history of present illness.    ROS All other systems reviewed  and are negative.   PHYSICAL EXAM:   VS:  There were no vitals taken for this visit.  Physical Exam  GEN: Well nourished, well developed, in no acute distress  HEENT: normal  Neck: no JVD, carotid bruits, or masses Cardiac:RRR; no murmurs, rubs, or gallops  Respiratory:  clear to auscultation bilaterally, normal work of breathing GI: soft, nontender, nondistended, + BS Ext: without cyanosis, clubbing, or edema, Good distal pulses bilaterally MS: no deformity or atrophy  Skin: warm and dry, no rash Neuro:  Alert and Oriented x 3, Strength and sensation are intact Psych: euthymic mood, full affect  Wt Readings from  Last 3 Encounters:  08/27/21 147 lb 0.8 oz (66.7 kg)  08/07/21 139 lb 8 oz (63.3 kg)  07/21/21 147 lb (66.7 kg)      Studies/Labs Reviewed:   EKG:  EKG is*** ordered today.  The ekg ordered today demonstrates ***  Recent Labs: 08/05/2021: B Natriuretic Peptide 513.5 08/27/2021: ALT 12 08/28/2021: BUN 19; Creatinine, Ser 0.85; Hemoglobin 9.8; Magnesium 1.9; Platelets 148; Potassium 3.6; Sodium 141   Lipid Panel    Component Value Date/Time   CHOL 153 08/02/2021 1906   TRIG 124 08/02/2021 1906   HDL 48 08/02/2021 1906   CHOLHDL 3.2 08/02/2021 1906   VLDL 25 08/02/2021 1906   [place] 80 08/02/2021 1906    Additional studies/ records that were reviewed today include:  Limited Echo: 08/26/2021 IMPRESSIONS     1. Distal apical hypokinesis. No thrombus noted. . Left ventricular  ejection fraction, by estimation, is 60 to 65%. The left ventricle has  normal function. There is mild left ventricular hypertrophy.   2. Right ventricular systolic function is normal. The right ventricular  size is normal.   3. Limited echo to evaluate LV function. Much improved LVEF compared to  08/03/21 study.    Risk Assessment/Calculations:   {Does this patient have ATRIAL FIBRILLATION?:[phone]}     ASSESSMENT:    No diagnosis found.   PLAN:  In order of problems listed above:  Chest Pain/CAD - Recent DESx2 to the LAD earlier this month as not felt to be a candidate for CABG given poor myocardial viability and the patient says she would have not wanted to proceed with CABG. Presented with chest pain which improved with SL NTG and spontaneously resolved - Hs troponin values  negative and her EKG shows chronic ST changes but no acute findings. Echo shows her EF has now normalized.  - Continue ASA, Plavix and Atorvastatin. No longer on beta-blocker therapy due to bradycardia and hypotension. Previously recommended to continue ASA for 30-days so would stop on 09/07/2021. Once she has completed 3  months of Eliquis for her apical thrombus, would likely need to restart ASA at that time and continue Plavix.      Hypotension - Felt to be secondary to dehydration. Has been off pressor support and Midodrine was reduced from '10mg'$  TID to '5mg'$  TID. As previously recommended, will plan to stop Midodrine as recommended yesterday since BP has been stable, at 121/52 - 151/71 within the past 24 hours.     HFrEF - Echo earlier this month showed her EF was reduced at 30-35% and has now normalized to 60-65% when rechecked this admission. She was on Coreg, Toprol-XL (?- unclear if taking this and Coreg), Farxiga, Entresto, Spironolactone and Chlorthalidone prior to admission. Beta-blocker therapy has been discontinued given her bradycardia and hypotension. No longer on Mearl Latin or Chlorthalidone. She has been restarted on Farxiga  $'10mg'O$  daily.     Apical Thrombus - Noted on cMRI during her recent admission. Echo this admission shows distal apical HK but no definitive thrombus. She has been on Eliquis '5mg'$  BID instead of Coumadin given the need for triple therapy. Dr. Harl Bowie recommended being on anticoagulation for 3 months.          Anemia - Hgb at 12.4  - 15.2 earlier this month, at 11.6 on admission and at 9.8 today. Undergoing workup by the admitting team. Ferritin is low-normal at 29 so may benefit from iron supplementation. She does report having one episode of melena and will check a stool card today as she is on ASA, Plavix and Eliquis.       Shared Decision Making/Informed Consent   {Are you ordering a CV Procedure (e.g. stress test, cath, DCCV, TEE, etc)?   Press F2        :[id number]}    Medication Adjustments/Labs and Tests Ordered: Current medicines are reviewed at length with the patient today.  Concerns regarding medicines are outlined above.  Medication changes, Labs and Tests ordered today are listed in the Patient Instructions below. There are no Patient Instructions on file for this  visit.   Sumner Boast, PA-C  09/05/2021 8:07 AM    [facility] [place], [place], [place]  [address] Phone: [phone]; Fax: [phone]

## 2021-09-08 ENCOUNTER — Encounter: Payer: Self-pay | Admitting: Physician Assistant

## 2021-09-08 ENCOUNTER — Other Ambulatory Visit (HOSPITAL_COMMUNITY)
Admission: RE | Admit: 2021-09-08 | Discharge: 2021-09-08 | Disposition: A | Discharge status: Home / Self Care | Payer: Medicare Other | Source: Ambulatory Visit | Attending: Physician Assistant | Admitting: Physician Assistant

## 2021-09-08 ENCOUNTER — Ambulatory Visit: Payer: Medicare Other | Admitting: Physician Assistant

## 2021-09-08 VITALS — BP 158/88 | HR 78 | Ht 63.0 in | Wt 143.2 lb

## 2021-09-08 DIAGNOSIS — I513 Intracardiac thrombosis, not elsewhere classified: Secondary | ICD-10-CM | POA: Diagnosis not present

## 2021-09-08 DIAGNOSIS — I959 Hypotension, unspecified: Secondary | ICD-10-CM | POA: Diagnosis not present

## 2021-09-08 DIAGNOSIS — I255 Ischemic cardiomyopathy: Secondary | ICD-10-CM | POA: Diagnosis not present

## 2021-09-08 DIAGNOSIS — I2510 Atherosclerotic heart disease of native coronary artery without angina pectoris: Secondary | ICD-10-CM

## 2021-09-08 DIAGNOSIS — D508 Other iron deficiency anemias: Secondary | ICD-10-CM

## 2021-09-08 LAB — CBC
HCT: 32.9 % — ABNORMAL LOW (ref 36.0–46.0)
Hemoglobin: 10.6 g/dL — ABNORMAL LOW (ref 12.0–15.0)
MCH: 30.4 pg (ref 26.0–34.0)
MCHC: 32.2 g/dL (ref 30.0–36.0)
MCV: 94.3 fL (ref 80.0–100.0)
Platelets: 206 10*3/uL (ref 150–400)
RBC: 3.49 MIL/uL — ABNORMAL LOW (ref 3.87–5.11)
RDW: 14.6 % (ref 11.5–15.5)
WBC: 5.2 10*3/uL (ref 4.0–10.5)
nRBC: 0 % (ref 0.0–0.2)

## 2021-09-08 LAB — BASIC METABOLIC PANEL
Anion gap: 8 (ref 5–15)
BUN: 21 mg/dL (ref 8–23)
CO2: 27 mmol/L (ref 22–32)
Calcium: 9 mg/dL (ref 8.9–10.3)
Chloride: 103 mmol/L (ref 98–111)
Creatinine, Ser: 0.96 mg/dL (ref 0.44–1.00)
GFR, Estimated: >60 mL/min (ref >60)
Glucose, Bld: 101 mg/dL — ABNORMAL HIGH (ref 70–99)
Potassium: 3.5 mmol/L (ref 3.5–5.1)
Sodium: 138 mmol/L (ref 135–145)

## 2021-09-08 MED ORDER — ASPIRIN 81 MG PO TBEC: 81.0000 mg | DELAYED_RELEASE_TABLET | Freq: Every day | ORAL | 3 refills | Status: DC

## 2021-09-08 MED ORDER — APIXABAN 5 MG PO TABS: 5.0000 mg | ORAL_TABLET | Freq: Two times a day (BID) | ORAL | 0 refills | Status: DC

## 2021-09-08 MED ORDER — DAPAGLIFLOZIN PROPANEDIOL 10 MG PO TABS: 10.0000 mg | ORAL_TABLET | Freq: Every day | ORAL | 0 refills | Status: DC

## 2021-09-08 NOTE — Patient Instructions (Addendum)
Medication Instructions:   On 11/09/21 STOP Eliquis, then, START Aspirin 81 mg daily  Labwork: CBC,BMET today  Testing/Procedures: None today  Follow-Up: Dr.Branch September  Any Other Special Instructions Will Be Listed Below (If Applicable).  If you need a refill on your cardiac medications before your next appointment, please call your pharmacy.

## 2021-09-23 DIAGNOSIS — I200 Unstable angina: Secondary | ICD-10-CM | POA: Insufficient documentation

## 2021-09-29 ENCOUNTER — Other Ambulatory Visit: Payer: Self-pay | Admitting: Physician Assistant

## 2021-10-03 ENCOUNTER — Telehealth: Payer: Self-pay | Admitting: Specialist

## 2021-10-03 NOTE — Telephone Encounter (Signed)
Patient called to let Dr. Louanne Skye know that she had a heart attack in June and was in the hospital most of the month of June. Patient said she just wanted Dr. Louanne Skye to know. Patient said her back is in bad shape. The number to contact patient is [phone]

## 2021-10-16 DIAGNOSIS — M25511 Pain in right shoulder: Secondary | ICD-10-CM | POA: Insufficient documentation

## 2021-10-21 DIAGNOSIS — N909 Noninflammatory disorder of vulva and perineum, unspecified: Secondary | ICD-10-CM | POA: Insufficient documentation

## 2021-11-12 ENCOUNTER — Telehealth: Payer: Self-pay | Admitting: Physician Assistant

## 2021-11-12 NOTE — Telephone Encounter (Signed)
Pt c/o medication issue:  1. Name of Medication: Eliquis  2. How are you currently taking this medication (dosage and times per day) not taking it?   3. Are you having a reaction (difficulty breathing--STAT)?   4. What is your medication issue? She was suppose to have stopped taking this medicine on 11-09-21 instead she stopped 12-08-21- she is concerned about stopping this message, since she have a blockage

## 2021-11-13 NOTE — Telephone Encounter (Signed)
Spoke with pt who ask how long Eliquis stays in your system because she is having surgery tomorrow. She states that she stopped her Eliquis on 11/08/21. Informed pt that the medicine is out of her system.

## 2021-11-13 NOTE — Telephone Encounter (Signed)
Agree eliquis is out of her system at this time, fine for surgery  Zandra Abts MD

## 2021-11-28 ENCOUNTER — Ambulatory Visit: Payer: Medicare Other | Admitting: Cardiology

## 2021-12-09 ENCOUNTER — Other Ambulatory Visit: Payer: Self-pay | Admitting: Physician Assistant

## 2021-12-17 ENCOUNTER — Telehealth: Payer: Self-pay | Admitting: Physician Assistant

## 2021-12-17 NOTE — Telephone Encounter (Signed)
Returned call to pt. No answer. Left msg to call back.  

## 2021-12-17 NOTE — Telephone Encounter (Signed)
  Pt c/o medication issue:  1. Name of Medication: Lisabeth Register   2. How are you currently taking this medication (dosage and times per day)?   3. Are you having a reaction (difficulty breathing--STAT)? No   4. What is your medication issue? Pt would like to know which medications she should be taking, entresto or farxiga

## 2021-12-18 NOTE — Telephone Encounter (Signed)
Pt notified and appt made for 01/14/22 at 2:20 pm.

## 2021-12-18 NOTE — Telephone Encounter (Signed)
BP's were low during admission and some of those meds stopped. Im not sure how her bp's have been recently. If she cannot afford the meds then I think that would limit [place] as well, there are some similar options that would be less expensive. Can she get a 1 month f/u with me please, could be on a hospital day at [place] MD

## 2021-12-18 NOTE — Telephone Encounter (Signed)
Spoke with [name] who states that her PCP would like her to start taking Entresto along with [place]. [name] would like to know if Dr. Harl Bowie recommends this? She states she is unable to afford both medications and is unable to get patient assistance. Please advise.

## 2022-01-06 ENCOUNTER — Ambulatory Visit: Payer: Medicare Other | Admitting: Cardiology

## 2022-02-11 ENCOUNTER — Other Ambulatory Visit: Payer: Self-pay | Admitting: Cardiology

## 2022-02-13 ENCOUNTER — Encounter: Payer: Self-pay | Admitting: Nurse Practitioner

## 2022-02-13 ENCOUNTER — Ambulatory Visit: Payer: Medicare Other | Attending: Cardiology | Admitting: Nurse Practitioner

## 2022-02-13 VITALS — BP 138/80 | HR 75 | Ht 63.0 in | Wt 145.0 lb

## 2022-02-13 DIAGNOSIS — Z7689 Persons encountering health services in other specified circumstances: Secondary | ICD-10-CM

## 2022-02-13 DIAGNOSIS — I255 Ischemic cardiomyopathy: Secondary | ICD-10-CM | POA: Diagnosis not present

## 2022-02-13 DIAGNOSIS — I513 Intracardiac thrombosis, not elsewhere classified: Secondary | ICD-10-CM

## 2022-02-13 DIAGNOSIS — I2510 Atherosclerotic heart disease of native coronary artery without angina pectoris: Secondary | ICD-10-CM

## 2022-02-13 DIAGNOSIS — E785 Hyperlipidemia, unspecified: Secondary | ICD-10-CM | POA: Diagnosis not present

## 2022-02-13 DIAGNOSIS — R0989 Other specified symptoms and signs involving the circulatory and respiratory systems: Secondary | ICD-10-CM

## 2022-02-13 DIAGNOSIS — I5022 Chronic systolic (congestive) heart failure: Secondary | ICD-10-CM | POA: Diagnosis not present

## 2022-02-13 DIAGNOSIS — I10 Essential (primary) hypertension: Secondary | ICD-10-CM

## 2022-02-13 DIAGNOSIS — I779 Disorder of arteries and arterioles, unspecified: Secondary | ICD-10-CM

## 2022-02-13 MED ORDER — FUROSEMIDE 40 MG PO TABS: 40.0000 mg | ORAL_TABLET | ORAL | 3 refills | Status: DC | PRN

## 2022-02-13 MED ORDER — POTASSIUM CHLORIDE CRYS ER 20 MEQ PO TBCR: 20.0000 meq | EXTENDED_RELEASE_TABLET | ORAL | 3 refills | Status: DC | PRN

## 2022-02-13 MED ORDER — CLOPIDOGREL BISULFATE 75 MG PO TABS: 75.0000 mg | ORAL_TABLET | Freq: Every day | ORAL | 6 refills | Status: DC

## 2022-02-13 MED ORDER — ATORVASTATIN CALCIUM 80 MG PO TABS: 80.0000 mg | ORAL_TABLET | Freq: Every day | ORAL | 6 refills | Status: DC

## 2022-02-13 NOTE — Progress Notes (Unsigned)
Cardiology Office Note:    Date:  02/13/2022  ID:  [name] [birth year], MRN [id number]  PCP:  Juanda Chance, [facility] Cardiologist:  Carlyle Dolly, MD     Referring MD: Karleen Hampshire., MD   CC: Here for follow-up  History of Present Illness:    [name] is a 76 y.o. 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  She was admitted to the hospital in June 2023 with recurrent chest pain, troponins were negative, and EKG was unchanged.  She underwent cardiac catheterization that showed severe multivessel CAD, was not felt to be a candidate for CABG, therefore she received 2 drug-eluting stents to LAD.  Echocardiogram around that time revealed heart failure with reduced ejection fraction, EF was found to be 30 to 35%.  Follow up echocardiogram revealed normal EF 60 to 65%, no thrombus.  She had bradycardia therefore beta-blocker was stopped.  She was dehydrated and treated for this before restarting CHF medications.  Last seen by Amie Portland, PA-C on September 08, 2021 for follow-up.  Chief complaint was bruising, feeling weak.  Denied any chest pain.  Sharyn Lull recommended that when she had completed 3 months of Eliquis for apical thrombus noted on cMRI and echocardiogram, would likely need to restart aspirin at that time and continue Plavix.  Plan was to stop Eliquis on November 09, 2021 for a total of 3 months of anticoagulation, and then restart aspirin. Was told to follow-up in 2 months with Dr. Harl Bowie.     Today she presents for follow-up.  She  states she has stopped Eliquis and only remains on Aspirin. Today overall she is doing very well. She shows me her weight log, BP log, and HR. Overall all stable VS. Weight is stable. Denies any chest pain, shortness of breath, palpitations, syncope, presyncope, orthopnea, swelling, significant weight changes, dizziness, PND, acute bleeding, or claudication.  She does admit to some memory loss.  She is a retired [employer] scientist.  Tolerating her medications well.  She is requesting referral to new primary care provider.  Denies any other questions or concerns today.  Past Medical History:  Diagnosis Date   Arthritis    CAD (coronary artery disease)    a. cath in 07/2021 showing severe multivessel CAD --> not felt to be a CABG candidate and limited viable myocardium --> received DESx2 to LAD   CHF (congestive heart failure) ([place])    a. EF 30-35% by echo in 07/2021, normalized to 60-65% by repeat imaging later that month.   Hx of non-Hodgkin's lymphoma    Hypertension    Left ventricular apical thrombus    a. noted on cMRI in 07/2021    Past Surgical History:  Procedure Laterality Date   CORONARY STENT INTERVENTION N/A 08/08/2021   Procedure: CORONARY STENT INTERVENTION;  Surgeon: Sherren Mocha, MD;  Location: [place] [facility];  Service: Cardiovascular;  Laterality: N/A;   INTRAVASCULAR ULTRASOUND/IVUS N/A 08/08/2021   Procedure: Intravascular Ultrasound/IVUS;  Surgeon: Sherren Mocha, MD;  Location: [place] [facility];  Service: Cardiovascular;  Laterality: N/A;   LEFT HEART  CATH AND CORONARY ANGIOGRAPHY N/A 08/02/2021   Procedure: LEFT HEART CATH AND CORONARY ANGIOGRAPHY;  Surgeon: Nelva Bush, MD;  Location: [place] [facility];  Service: Cardiovascular;  Laterality: N/A;    Current Medications: Current Meds  Medication Sig   ALPRAZolam (XANAX) 0.5 MG tablet Take 0.5 mg by mouth 2 (two) times daily as needed.   aspirin EC 81 MG tablet Take 1 tablet (81 mg total) by mouth daily. Swallow whole.    bisacodyl (DULCOLAX) 10 MG suppository Place 1 suppository (10 mg total) rectally daily as needed for moderate constipation.   Calcium Carb-Cholecalciferol (OYSTER SHELL CALCIUM W/D) 500-5 MG-MCG TABS Take by mouth.   cyanocobalamin (,VITAMIN B-12,) 1000 MCG/ML injection Inject 1 mcg into the muscle every 30 (thirty) days.   dapagliflozin propanediol (FARXIGA) 10 MG TABS tablet TAKE ONE TABLET BY MOUTH EVERY DAY   diclofenac Sodium (VOLTAREN) 1 % GEL Apply 2 g topically 4 (four) times daily as needed (pain).   DULoxetine (CYMBALTA) 30 MG capsule Take 30 mg by mouth 3 (three) times daily.   fluocinonide gel (LIDEX) 0.05 %    fluticasone (FLONASE) 50 MCG/ACT nasal spray Place 2 sprays into both nostrils daily as needed for allergies.   gabapentin (NEURONTIN) 800 MG tablet Take 800 mg by mouth 3 (three) times daily.   melatonin 5 MG TABS Take 1 tablet (5 mg total) by mouth at bedtime as needed. (Patient taking differently: Take 6 mg by mouth at bedtime as needed.)   nitroGLYCERIN (NITROSTAT) 0.4 MG SL tablet Place 1 tablet (0.4 mg total) under the tongue every 5 (five) minutes x 3 doses as needed for chest pain.   oxycodone (ROXICODONE) 30 MG immediate release tablet Take 30 mg by mouth 3 (three) times daily as needed for pain.   pantoprazole (PROTONIX) 40 MG tablet Take 40 mg by mouth daily.    atorvastatin (LIPITOR) 80 MG tablet Take 1 tablet (80 mg total) by mouth at bedtime.   clopidogrel (PLAVIX) 75 MG tablet Take 1 tablet (75 mg total) by mouth daily.     Allergies:   Morphine   Social History   Socioeconomic History   Marital status: Widowed    Spouse name: Not on file   Number of children: Not on file   Years of education: Not on file   Highest education level: Not on file  Occupational History   Not on file  Tobacco Use   Smoking status: Every Day    Types: Cigarettes   Smokeless tobacco: Never  Vaping Use   Vaping Use: Never used  Substance and Sexual Activity   Alcohol  use: Not Currently   Drug use: Never   Sexual activity: Not on file  Other Topics Concern   Not on file  Social History Narrative   Not on file   Social Determinants of Health   Financial Resource Strain: Not on file  Food Insecurity: Not on file  Transportation Needs: Not on file  Physical Activity: Not on file  Stress: Not on file  Social Connections: Not on file     Family History: The patient's family history includes Aortic aneurysm in her mother; Coronary artery disease in her brother.  ROS:   Review of Systems  Constitutional: Negative.   HENT: Negative.    Eyes: Negative.   Respiratory: Negative.    Cardiovascular: Negative.   Gastrointestinal: Negative.   Genitourinary: Negative.   Musculoskeletal: Negative.   Skin: Negative.   Neurological: Negative.   Endo/Heme/Allergies:  Negative.   Psychiatric/Behavioral:  Positive for memory loss. Negative for depression, hallucinations, substance abuse and suicidal ideas. The patient is not nervous/anxious and does not have insomnia.     Please see the history of present illness.    All other systems reviewed and are negative.  EKGs/Labs/Other Studies Reviewed:    The following studies were reviewed today:   EKG:  EKG is not ordered today.    Limited Echo on 08/26/2021:  1. Distal apical hypokinesis. No thrombus noted. . Left ventricular  ejection fraction, by estimation, is 60 to 65%. The left ventricle has  normal function. There is mild left ventricular hypertrophy.   2. Right ventricular systolic function is normal. The right ventricular  size is normal.   3. Limited echo to evaluate LV function. Much improved LVEF compared to  08/03/21 study.  Coronary stent intervention on August 08, 2021:   Mid LAD-1 lesion is 80% stenosed.   Mid LAD-2 lesion is 90% stenosed.   Post intervention, there is a 0% residual stenosis.   Post intervention, there is a 0% residual stenosis.   Successful PCI of the proximal and mid  LAD tandem stenoses using a 3.0 x 20 mm Synergy DES over the proximal lesion and a 2.25 x 32 mm Synergy DES over the distal lesions.  The procedure is guided by intravascular ultrasound.   Recommendations: Dual antiplatelet therapy with aspirin and clopidogrel x12 months without interruption.  Aggressive risk reduction measures and anti-ischemic therapy for residual ischemic heart disease.  Cardiac MRI on August 04, 2021: IMPRESSION: No evidence of late gadolinium enhancement-suggestive of viable myocardium.   Small LV thrombus.  2D echocardiogram on August 03, 2021:  1. Left ventricular ejection fraction, by estimation, is 30 to 35%. The  left ventricle has moderately decreased function. The left ventricle  demonstrates regional wall motion abnormalities (see scoring  diagram/findings for description). There is  moderate asymmetric left ventricular hypertrophy of the basal-septal  segment. Left ventricular diastolic parameters are consistent with Grade I  diastolic dysfunction (impaired relaxation).   2. LV apical mural thrombus   3. Right ventricular systolic function is normal. The right ventricular  size is normal. Tricuspid regurgitation signal is inadequate for assessing  PA pressure.   4. The mitral valve is degenerative. Trivial mitral valve regurgitation.  No evidence of mitral stenosis. Moderate mitral annular calcification.   5. The aortic valve was not well visualized. Aortic valve regurgitation  is trivial. No aortic stenosis is present.   6. The inferior vena cava is normal in size with greater than 50%  respiratory variability, suggesting right atrial pressure of 3 mmHg.  Left heart cath and coronary angiography on August 02, 2021: Conclusions: Severe three-vessel coronary artery disease including sequential 80-90% proximal and mid LAD stenoses, 70% proximal ramus intermedius lesion, multifocal LCx disease of up to 90% in the mid/distal vessel, and diffusely diseased  proximal/mid RCA with up to 50% stenosis as well as sequential 90% ostial and 70% mid RPDA lesions. Moderately-severely elevated left ventricular filling pressure (LVEDP 30-35 mmHg).  LVEF suboptimally assessed but is likely mildly-moderately reduced with anterior hypokinesis.   Recommendations: Case discussed with Dr. Cyndia Bent (cardiac surgery).  There is not a clear culprit for the patient's EKG changes.  I suspect that she may have had an ischemic event several days ago with her transient chest pain and is now manifesting predominantly heart failure symptoms.  We will optimize her heart failure while discussing optimal revascularization strategy. Patient given furosemide  40 mg IV at the end of the procedure.  Continue diuresis as blood pressure and renal function tolerate. Hold home doses of metoprolol and lisinopril for now.  Reinstitute goal-directed medical therapy as tolerated based on blood pressure, renal function, and heart failure. Obtain echocardiogram. Restart IV heparin 2 hours after TR band removal.  Defer adding P2Y12 inhibitor pending cardiac surgery consultation. Aggressive secondary prevention of coronary artery disease.  Recent Labs: 08/05/2021: B Natriuretic Peptide 513.5 08/27/2021: ALT 12 08/28/2021: Magnesium 1.9 09/08/2021: BUN 21; Creatinine, Ser 0.96; Hemoglobin 10.6; Platelets 206; Potassium 3.5; Sodium 138  Recent Lipid Panel    Component Value Date/Time   CHOL 153 08/02/2021 1906   TRIG 124 08/02/2021 1906   HDL 48 08/02/2021 1906   CHOLHDL 3.2 08/02/2021 1906   VLDL 25 08/02/2021 1906   LDLCALC 80 08/02/2021 1906   Physical Exam:    VS:  BP 138/80   Pulse 75   Ht '5\' 3"'$  (1.6 m)   Wt 145 lb (65.8 kg)   SpO2 98%   BMI 25.69 kg/m     Wt Readings from Last 3 Encounters:  02/13/22 145 lb (65.8 kg)  09/08/21 143 lb 3.2 oz (65 kg)  08/27/21 147 lb 0.8 oz (66.7 kg)     GEN: Well nourished, well developed in no acute distress HEENT: Normal NECK: No JVD; R  carotid bruit without L carotid bruit noted CARDIAC: S1/S2, RRR, no murmurs, rubs, gallops; 2+ peripheral pulses throughout, strong and equal bilaterally RESPIRATORY:  Clear and diminished to auscultation without rales, wheezing or rhonchi  MUSCULOSKELETAL:  No edema; No deformity  SKIN: Thin skin, warm and dry NEUROLOGIC:  Alert and oriented x 3 PSYCHIATRIC:  Normal affect   ASSESSMENT:    1. Coronary artery disease involving native heart without angina pectoris, unspecified vessel or lesion type   2. Hyperlipidemia, unspecified hyperlipidemia type   3. Heart failure with improved ejection fraction (HFimpEF) ([place])   4. Ischemic cardiomyopathy   5. Hypertension, unspecified type   6. Carotid artery disease, unspecified laterality, unspecified type ([place])   7. Bruit of right carotid artery   8. Left ventricular apical thrombus   9. Encounter to establish care with new doctor   10. Encounter to establish care    PLAN:    In order of problems listed above:  CAD, s/p drug-eluting stents x 2 to LAD Stable with no anginal symptoms. No indication for ischemic evaluation.  Continue aspirin, atorvastatin, Plavix, and nitroglycerin as needed. Heart healthy diet and regular cardiovascular exercise encouraged.  Will refill Plavix today.  HLD Labs from June 2023 revealed total cholesterol 153, HDL 48, LDL 80, and triglycerides 124.  Continue atorvastatin.  At next office visit, plan to discuss rechecking FLP and LFT. Heart healthy diet and regular cardiovascular exercise encouraged.  Will refill atorvastatin today.  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  Limited echocardiogram at the end of June revealed improvement in EF to 60 to 65%, mild LVH, distal apical hypokinesis. Euvolemic and well compensated on exam.  Continue aspirin, Lipitor, Plavix, and Farxiga.  Will initiate Lasix 40 mg  daily as needed for shortness of breath or weight gain as well as potassium 20 mill equivalents daily as needed to be taken with Lasix. Low sodium diet, fluid restriction <2L, and daily weights encouraged. Educated to contact our office for weight gain of 2 lbs overnight  or 5 lbs in one week. Heart healthy diet and regular cardiovascular exercise encouraged.   HTN Blood pressure today 138/80. Continue current medication regimen. Discussed to monitor BP at home at least 2 hours after medications and sitting for 5-10 minutes. Heart healthy diet and regular cardiovascular exercise encouraged.   Carotid artery stenosis, Right carotid bruit Right carotid bruit noted on exam. Hx of chronic carotid artery stenosis. Cartoid doppler has not been performed in several years per patient's report. Will update carotid doppler at this time. Continue ASA and atorvastatin. Heart healthy diet and regular cardiovascular exercise encouraged.   Hx of LV apical thrombus Has completed 3 months of Eliquis therapy and continues on ASA therapy. Consulted Dr. Harl Bowie who recommended that patient does not need repeat Echo at this time. No thrombus was noted on limited Echo at end of June and EF improved. Continue ASA therapy. Heart healthy diet and regular cardiovascular exercise encouraged.   Encounter to establish care with a new doctor Patient is requesting referral to new primary care provider. Will arrange this for her.   Disposition: Follow-up with me or Dr. Carlyle Dolly in 3 months or sooner if anything changes.    Medication Adjustments/Labs and Tests Ordered: Current medicines are reviewed at length with the patient today.  Concerns regarding medicines are outlined above.  Orders Placed This Encounter  Procedures   Ambulatory referral to Family Practice   VAS US CAROTID   Meds ordered this encounter  Medications   furosemide (LASIX) 40 MG tablet    Sig: Take 1 tablet (40 mg total) by mouth as needed  (shortness of breath or weight gain).    Dispense:  30 tablet    Refill:  3    New 02/13/2022   potassium chloride SA (KLOR-CON M) 20 MEQ tablet    Sig: Take 1 tablet (20 mEq total) by mouth as needed (only on the days that you take the Lasix).    Dispense:  30 tablet    Refill:  3    New 02/13/2022   atorvastatin (LIPITOR) 80 MG tablet    Sig: Take 1 tablet (80 mg total) by mouth at bedtime.    Dispense:  30 tablet    Refill:  6   clopidogrel (PLAVIX) 75 MG tablet    Sig: Take 1 tablet (75 mg total) by mouth daily.    Dispense:  30 tablet    Refill:  6    Patient Instructions  Medication Instructions:  Begin Lasix '40mg'$  as needed for shortness of breath or weight gain Begin Potassium 25mq as needed on days you take the Lasix  Continue all other medications.     Labwork: none  Testing/Procedures: Your physician has requested that you have a carotid duplex. This test is an ultrasound of the carotid arteries in your neck. It looks at blood flow through these arteries that supply the brain with blood. Allow one hour for this exam. There are no restrictions or special instructions. Office will contact with results via phone, letter or mychart.     Follow-Up: 3 months   Any Other Special Instructions Will Be Listed Below (If Applicable). [facility]  If you need a refill on your cardiac medications before your next appointment, please call your pharmacy.    Signed, EFinis Bud NP  02/15/2022 [address]

## 2022-02-13 NOTE — Patient Instructions (Addendum)
Medication Instructions:  Begin Lasix '40mg'$  as needed for shortness of breath or weight gain Begin Potassium 87mq as needed on days you take the Lasix  Continue all other medications.     Labwork: none  Testing/Procedures: Your physician has requested that you have a carotid duplex. This test is an ultrasound of the carotid arteries in your neck. It looks at blood flow through these arteries that supply the brain with blood. Allow one hour for this exam. There are no restrictions or special instructions. Office will contact with results via phone, letter or mychart.     Follow-Up: 3 months   Any Other Special Instructions Will Be Listed Below (If Applicable). [facility]  If you need a refill on your cardiac medications before your next appointment, please call your pharmacy.

## 2022-02-24 ENCOUNTER — Ambulatory Visit: Payer: Medicare Other | Admitting: Family Medicine

## 2022-03-03 ENCOUNTER — Ambulatory Visit
Admission: RE | Admit: 2022-03-03 | Discharge: 2022-03-03 | Discharge status: Against Medical Advice | Payer: Medicare Other | Source: Ambulatory Visit | Attending: Family Medicine | Admitting: Family Medicine

## 2022-03-04 ENCOUNTER — Ambulatory Visit: Payer: Medicare Other | Attending: Nurse Practitioner

## 2022-03-04 DIAGNOSIS — R0989 Other specified symptoms and signs involving the circulatory and respiratory systems: Secondary | ICD-10-CM | POA: Diagnosis not present

## 2022-03-05 ENCOUNTER — Ambulatory Visit: Payer: Medicare Other | Admitting: Family Medicine

## 2022-03-11 ENCOUNTER — Encounter: Payer: Self-pay | Admitting: Family Medicine

## 2022-03-11 ENCOUNTER — Ambulatory Visit (INDEPENDENT_AMBULATORY_CARE_PROVIDER_SITE_OTHER): Payer: Medicare Other | Admitting: Family Medicine

## 2022-03-11 VITALS — BP 149/56 | HR 57 | Ht 63.0 in | Wt 144.0 lb

## 2022-03-11 DIAGNOSIS — Z1159 Encounter for screening for other viral diseases: Secondary | ICD-10-CM

## 2022-03-11 DIAGNOSIS — I10 Essential (primary) hypertension: Secondary | ICD-10-CM | POA: Insufficient documentation

## 2022-03-11 DIAGNOSIS — E785 Hyperlipidemia, unspecified: Secondary | ICD-10-CM

## 2022-03-11 DIAGNOSIS — Z1321 Encounter for screening for nutritional disorder: Secondary | ICD-10-CM | POA: Diagnosis not present

## 2022-03-11 DIAGNOSIS — E039 Hypothyroidism, unspecified: Secondary | ICD-10-CM

## 2022-03-11 DIAGNOSIS — R7301 Impaired fasting glucose: Secondary | ICD-10-CM

## 2022-03-11 DIAGNOSIS — Z114 Encounter for screening for human immunodeficiency virus [HIV]: Secondary | ICD-10-CM

## 2022-03-11 DIAGNOSIS — Z13820 Encounter for screening for osteoporosis: Secondary | ICD-10-CM

## 2022-03-11 DIAGNOSIS — F419 Anxiety disorder, unspecified: Secondary | ICD-10-CM

## 2022-03-11 MED ORDER — LOSARTAN POTASSIUM 25 MG PO TABS: 25.0000 mg | ORAL_TABLET | Freq: Every day | ORAL | 0 refills | Status: DC

## 2022-03-11 NOTE — Progress Notes (Signed)
New Patient Office Visit  Subjective:     Patient ID: [name], female    DOB: 1946/01/20, 77 y.o.   MRN: [id number]  Chief Complaint  Patient presents with   Establish Care   Hyperlipidemia   Depression   Anxiety   Congestive Heart Failure   Hypertension    [name] 77 year old female with past medical history of CHF,hyperlipidemia, depression, anxiety  Patient here for follow-up of elevated blood pressure. She is exercising and is adherent to low salt diet.  Blood pressure is not well controlled at home. Cardiac symptoms  Patient denies chest pain, chest pressure/discomfort, dyspnea, and palpitations.  Cardiovascular risk factors: advanced age and smoking/ tobacco exposure.   Anxiety Presents for initial visit. Onset was more than 5 years ago. The problem has been gradually worsening. Symptoms include nervous/anxious behavior. Patient reports no dizziness or palpitations. Symptoms occur occasionally. The severity of symptoms is moderate. The symptoms are aggravated by family issues. The quality of sleep is good. Nighttime awakenings: none.   Risk factors include a major life event. Her past medical history is significant for anxiety/panic attacks, CHF and depression. There is no history of suicide attempts. Treatments tried: takes xanax. The treatment provided significant relief.    Review of Systems  Constitutional:  Negative for chills and fever.  HENT:  Positive for tinnitus. Negative for hearing loss and sore throat.   Eyes:  Negative for blurred vision.  Respiratory:  Negative for wheezing.   Cardiovascular:  Negative for palpitations.  Gastrointestinal:  Negative for abdominal pain, diarrhea and vomiting.  Musculoskeletal:  Negative for neck pain.  Neurological:  Positive for headaches. Negative for dizziness.  Psychiatric/Behavioral:  Positive for depression. The patient is nervous/anxious.         Objective:    BP (!) 149/56 (Patient Position:  Sitting)   Pulse (!) 57   Ht '5\' 3"'$  (1.6 m)   Wt 144 lb (65.3 kg)   SpO2 95%   BMI 25.51 kg/m  BP Readings from Last 3 Encounters:  03/11/22 (!) 149/56  02/13/22 138/80  09/08/21 (!) 158/88      Physical Exam HENT:     Head: Normocephalic.     Right Ear: Tympanic membrane normal.     Left Ear: Tympanic membrane normal.     Mouth/Throat:     Mouth: Mucous membranes are moist.  Eyes:     Pupils: Pupils are equal, round, and reactive to light.  Cardiovascular:     Rate and Rhythm: Normal rate.     Pulses: Normal pulses.  Pulmonary:     Effort: Pulmonary effort is normal. No respiratory distress.     Breath sounds: Normal breath sounds.  Abdominal:     General: Bowel sounds are normal.     Palpations: Abdomen is soft.  Musculoskeletal:        General: Normal range of motion.     Cervical back: Normal range of motion.  Skin:    General: Skin is warm and dry.     Capillary Refill: Capillary refill takes less than 2 seconds.  Neurological:     Mental Status: She is alert.     Coordination: Coordination normal.     Gait: Gait normal.     No results found for any visits on 03/11/22.      Assessment & Plan:   Problem List Items Addressed This Visit       Cardiovascular and Mediastinum   Primary hypertension -  Primary    Patient blood pressure slightly elevated on second reading 149/56. Patient stated has a blood pressure log for the month showed high blood pressure reading of 162/78, 158/76, 160/70, 163/71 Advise patient to take blood pressure reading before taking B/P medications  Prescribed losartan 25 mg, follow up in 3 months Elaborated the importance of following a Dash diet and maintain a exercise routine      Relevant Medications   losartan (COZAAR) 25 MG tablet   Other Relevant Orders   Microalbumin / creatinine urine ratio   CMP14+EGFR   CBC with Differential/Platelet     Endocrine   IFG (impaired fasting glucose)   Relevant Orders   Hemoglobin  A1c     Other   Anxiety    Patient currently taking xanax for anxiety. Patient stated does not have a psychiatrist. Patient has been feeling anxious, still grieving the death of her two daughters. Patient is anxious about current family issues      Relevant Orders   Ambulatory referral to Psychiatry   Other Visit Diagnoses     Need for hepatitis C screening test       Relevant Orders   Hepatitis C antibody   Encounter for vitamin deficiency screening       Relevant Orders   VITAMIN D 25 Hydroxy (Vit-D Deficiency, Fractures)   Encounter for screening for HIV       Relevant Orders   HIV Antibody (routine testing w rflx)   Hypothyroidism, unspecified type       Relevant Orders   TSH + free T4   Hyperlipidemia, unspecified hyperlipidemia type       Relevant Medications   losartan (COZAAR) 25 MG tablet   Other Relevant Orders   Lipid panel   Screening for osteoporosis       Relevant Orders   HM DEXA SCAN (Completed)       Meds ordered this encounter  Medications   losartan (COZAAR) 25 MG tablet    Sig: Take 1 tablet (25 mg total) by mouth daily.    Dispense:  90 tablet    Refill:  0    No follow-ups on file.  Renard Hamper Ria Comment, FNP

## 2022-03-11 NOTE — Assessment & Plan Note (Addendum)
Patient blood pressure slightly elevated on second reading 149/56. Patient stated has a blood pressure log for the month showed high blood pressure reading of 162/78, 158/76, 160/70, 163/71 Advise patient to take blood pressure reading before taking B/P medications  Prescribed losartan 25 mg, follow up in 3 months Elaborated the importance of following a [place] diet and maintain a exercise routine

## 2022-03-11 NOTE — Assessment & Plan Note (Signed)
Patient currently taking xanax for anxiety. Patient stated does not have a psychiatrist. Patient has been feeling anxious, still grieving the death of her two daughters. Patient is anxious about current family issues

## 2022-03-11 NOTE — Patient Instructions (Signed)
Follow up in 3 months HTN management

## 2022-03-14 LAB — CBC WITH DIFFERENTIAL/PLATELET
Basophils Absolute: 0.1 10*3/uL (ref 0.0–0.2)
Basos: 1 %
EOS (ABSOLUTE): 0.3 10*3/uL (ref 0.0–0.4)
Eos: 5 %
Hematocrit: 40.4 % (ref 34.0–46.6)
Hemoglobin: 12.9 g/dL (ref 11.1–15.9)
Immature Grans (Abs): 0 10*3/uL (ref 0.0–0.1)
Immature Granulocytes: 0 %
Lymphocytes Absolute: 1.2 10*3/uL (ref 0.7–3.1)
Lymphs: 19 %
MCH: 28 pg (ref 26.6–33.0)
MCHC: 31.9 g/dL (ref 31.5–35.7)
MCV: 88 fL (ref 79–97)
Monocytes Absolute: 0.7 10*3/uL (ref 0.1–0.9)
Monocytes: 12 %
Neutrophils Absolute: 4.1 10*3/uL (ref 1.4–7.0)
Neutrophils: 63 %
Platelets: 252 10*3/uL (ref 150–450)
RBC: 4.6 x10E6/uL (ref 3.77–5.28)
RDW: 16 % — ABNORMAL HIGH (ref 11.7–15.4)
WBC: 6.3 10*3/uL (ref 3.4–10.8)

## 2022-03-14 LAB — CMP14+EGFR
ALT: 61 IU/L — ABNORMAL HIGH (ref 0–32)
AST: 62 IU/L — ABNORMAL HIGH (ref 0–40)
Albumin/Globulin Ratio: 2.1 (ref 1.2–2.2)
Albumin: 4.4 g/dL (ref 3.8–4.8)
Alkaline Phosphatase: 146 IU/L — ABNORMAL HIGH (ref 44–121)
BUN/Creatinine Ratio: 15 (ref 12–28)
BUN: 17 mg/dL (ref 8–27)
Bilirubin Total: 0.5 mg/dL (ref 0.0–1.2)
CO2: 30 mmol/L — ABNORMAL HIGH (ref 20–29)
Calcium: 9.6 mg/dL (ref 8.7–10.3)
Chloride: 103 mmol/L (ref 96–106)
Creatinine, Ser: 1.1 mg/dL — ABNORMAL HIGH (ref 0.57–1.00)
Globulin, Total: 2.1 g/dL (ref 1.5–4.5)
Glucose: 95 mg/dL (ref 70–99)
Potassium: 4.2 mmol/L (ref 3.5–5.2)
Sodium: 146 mmol/L — ABNORMAL HIGH (ref 134–144)
Total Protein: 6.5 g/dL (ref 6.0–8.5)
eGFR: 52 mL/min/{1.73_m2} — ABNORMAL LOW (ref >59)

## 2022-03-14 LAB — MICROALBUMIN / CREATININE URINE RATIO
Creatinine, Urine: 158.2 mg/dL
Microalb/Creat Ratio: 8 mg/g creat (ref 0–29)
Microalbumin, Urine: 11.9 ug/mL

## 2022-03-14 LAB — LIPID PANEL
Chol/HDL Ratio: 2.9 ratio (ref 0.0–4.4)
Cholesterol, Total: 162 mg/dL (ref 100–199)
HDL: 56 mg/dL (ref >39)
LDL Chol Calc (NIH): 89 mg/dL (ref 0–99)
Triglycerides: 93 mg/dL (ref 0–149)
VLDL Cholesterol Cal: 17 mg/dL (ref 5–40)

## 2022-03-14 LAB — HEMOGLOBIN A1C
Est. average glucose Bld gHb Est-mCnc: 114 mg/dL
Hgb A1c MFr Bld: 5.6 % (ref 4.8–5.6)

## 2022-03-14 LAB — VITAMIN D 25 HYDROXY (VIT D DEFICIENCY, FRACTURES): Vit D, 25-Hydroxy: 60.4 ng/mL (ref 30.0–100.0)

## 2022-03-14 LAB — HEPATITIS C ANTIBODY: Hep C Virus Ab: NONREACTIVE

## 2022-03-14 LAB — TSH+FREE T4
Free T4: 1.1 ng/dL (ref 0.82–1.77)
TSH: 2.66 u[IU]/mL (ref 0.450–4.500)

## 2022-03-14 LAB — HIV ANTIBODY (ROUTINE TESTING W REFLEX): HIV Screen 4th Generation wRfx: NONREACTIVE

## 2022-03-23 ENCOUNTER — Telehealth: Payer: Self-pay | Admitting: *Deleted

## 2022-03-23 NOTE — Telephone Encounter (Signed)
Laurine Blazer, LPN 04/09/1386  7:19 PM EST Back to Top    Notified, copy to pcp.

## 2022-03-23 NOTE — Telephone Encounter (Signed)
-----  Message from Merlene Laughter, RN sent at 03/17/2022  8:27 AM EST -----  ----- Message ----- From: Finis Bud, NP Sent: 03/13/2022   3:11 PM EST To: Merlene Laughter, RN  Right ICA has 40-59% narrowing with L ICA at 1-39% narrowing. Please arrange repeat carotid doppler study in 1 year for carotid artery disease.   Thanks!  Best, Finis Bud, AGNP-C

## 2022-03-24 ENCOUNTER — Other Ambulatory Visit: Payer: Self-pay | Admitting: Family Medicine

## 2022-03-24 DIAGNOSIS — R7401 Elevation of levels of liver transaminase levels: Secondary | ICD-10-CM

## 2022-03-24 NOTE — Progress Notes (Signed)
Please inform patient referral placed for Gastroenterologist for further evaluation due to elevated liver blood work.

## 2022-03-25 ENCOUNTER — Encounter: Payer: Self-pay | Admitting: Gastroenterology

## 2022-04-06 NOTE — Progress Notes (Deleted)
Referring Provider: Juanda Chance, [place] Primary Care Physician:  Juanda Chance, FNP Primary Gastroenterologist:  Dr. Rayne Du chief complaint on file.   HPI:   [name] is a 77 y.o. female with history of CAD with history of PCI, HF with improved EF on last Echo in June 2023 following PCI, carotid artery disease, HTN, non-Hodgkin's lipoma, arthritis, presenting today at the request of Del Eli Hose, FNP for elevated LFTs.   Per chart review, patient had new onset LFT elevation 03/11/2022 with AST 62, ALT 61, alk phos 146. Platelets wnl. TSH normal. Hep C Ab non-reactive.   Hepatic steatosis noted on CT A/P with contrast in April 2017.  Today:     Colonoscopy December 2020 at [facility]. Unable to review report in [place].   Past Medical History:  Diagnosis Date   Arthritis    CAD (coronary artery disease)    a. cath in 07/2021 showing severe multivessel CAD --> not felt to be a CABG candidate and limited viable myocardium --> received DESx2 to LAD   CHF (congestive heart failure) ([place])    a. EF 30-35% by echo in 07/2021, normalized to 60-65% by repeat imaging later that month.   Hx of non-Hodgkin's lymphoma    Hypertension    Left ventricular apical thrombus    a. noted on cMRI in 07/2021    Past Surgical History:  Procedure Laterality Date   CORONARY STENT INTERVENTION N/A 08/08/2021   Procedure: CORONARY STENT INTERVENTION;  Surgeon: Sherren Mocha, MD;  Location: [place] [facility];  Service: Cardiovascular;  Laterality: N/A;   INTRAVASCULAR ULTRASOUND/IVUS N/A 08/08/2021   Procedure: Intravascular Ultrasound/IVUS;  Surgeon: Sherren Mocha, MD;  Location: [place] [facility];  Service: Cardiovascular;  Laterality: N/A;   LEFT HEART CATH AND CORONARY ANGIOGRAPHY N/A 08/02/2021   Procedure: LEFT HEART CATH AND CORONARY ANGIOGRAPHY;  Surgeon: Nelva Bush, MD;  Location: [place] [facility];  Service: Cardiovascular;   Laterality: N/A;    Current Outpatient Medications  Medication Sig Dispense Refill   ALPRAZolam (XANAX) 0.5 MG tablet Take 0.5 mg by mouth 2 (two) times daily as needed.     aspirin EC 81 MG tablet Take 1 tablet (81 mg total) by mouth daily. Swallow whole. 90 tablet 3   atorvastatin (LIPITOR) 80 MG tablet Take 1 tablet (80 mg total) by mouth at bedtime. 30 tablet 6   bisacodyl (DULCOLAX) 10 MG suppository Place 1 suppository (10 mg total) rectally daily as needed for moderate constipation. 12 suppository 0   Calcium Carb-Cholecalciferol (OYSTER SHELL CALCIUM W/D) 500-5 MG-MCG TABS Take by mouth.     clopidogrel (PLAVIX) 75 MG tablet Take 1 tablet (75 mg total) by mouth daily. 30 tablet 6   cyanocobalamin (,VITAMIN B-12,) 1000 MCG/ML injection Inject 1 mcg into the muscle every 30 (thirty) days.     dapagliflozin propanediol (FARXIGA) 10 MG TABS tablet TAKE ONE TABLET BY MOUTH EVERY DAY 30 tablet 1   diclofenac Sodium (VOLTAREN) 1 % GEL Apply 2 g topically 4 (four) times daily as needed (pain).     DULoxetine (CYMBALTA) 30 MG capsule Take 30 mg by mouth 3 (three) times daily.     fluocinonide gel (LIDEX) 0.05 %      fluticasone (FLONASE) 50 MCG/ACT nasal spray Place 2 sprays into both nostrils daily as needed for allergies.     furosemide (LASIX) 40 MG tablet Take 1 tablet (40 mg total) by mouth  as needed (shortness of breath or weight gain). 30 tablet 3   gabapentin (NEURONTIN) 800 MG tablet Take 800 mg by mouth 3 (three) times daily.     losartan (COZAAR) 25 MG tablet Take 1 tablet (25 mg total) by mouth daily. 90 tablet 0   melatonin 5 MG TABS Take 1 tablet (5 mg total) by mouth at bedtime as needed. (Patient taking differently: Take 6 mg by mouth at bedtime as needed.)  0   nitroGLYCERIN (NITROSTAT) 0.4 MG SL tablet Place 1 tablet (0.4 mg total) under the tongue every 5 (five) minutes x 3 doses as needed for chest pain. 25 tablet 12   oxycodone (ROXICODONE) 30 MG immediate release tablet  Take 30 mg by mouth 3 (three) times daily as needed for pain.     pantoprazole (PROTONIX) 40 MG tablet Take 40 mg by mouth daily.     potassium chloride SA (KLOR-CON M) 20 MEQ tablet Take 1 tablet (20 mEq total) by mouth as needed (only on the days that you take the Lasix). 30 tablet 3   No current facility-administered medications for this visit.    Allergies as of 04/08/2022 - Review Complete 03/11/2022  Allergen Reaction Noted   Morphine Rash 08/22/2014    Family History  Problem Relation Age of Onset   Aortic aneurysm Mother    Coronary artery disease Brother     Social History   Socioeconomic History   Marital status: Widowed    Spouse name: Not on file   Number of children: Not on file   Years of education: Not on file   Highest education level: Not on file  Occupational History   Not on file  Tobacco Use   Smoking status: Every Day    Types: Cigarettes   Smokeless tobacco: Never  Vaping Use   Vaping Use: Never used  Substance and Sexual Activity   Alcohol use: Not Currently   Drug use: Never   Sexual activity: Not on file  Other Topics Concern   Not on file  Social History Narrative   Not on file   Social Determinants of Health   Financial Resource Strain: Not on file  Food Insecurity: Not on file  Transportation Needs: Not on file  Physical Activity: Not on file  Stress: Not on file  Social Connections: Not on file  Intimate Partner Violence: Not on file    Review of Systems: Gen: Denies any fever, chills, fatigue, weight loss, lack of appetite.  CV: Denies chest pain, heart palpitations, peripheral edema, syncope.  Resp: Denies shortness of breath at rest or with exertion. Denies wheezing or cough.  GI: Denies dysphagia or odynophagia. Denies jaundice, hematemesis, fecal incontinence. GU : Denies urinary burning, urinary frequency, urinary hesitancy MS: Denies joint pain, muscle weakness, cramps, or limitation of movement.  Derm: Denies rash,  itching, dry skin Psych: Denies depression, anxiety, memory loss, and confusion Heme: Denies bruising, bleeding, and enlarged lymph nodes.  Physical Exam: There were no vitals taken for this visit. General:   Alert and oriented. Pleasant and cooperative. Well-nourished and well-developed.  Head:  Normocephalic and atraumatic. Eyes:  Without icterus, sclera clear and conjunctiva pink.  Ears:  Normal auditory acuity. Lungs:  Clear to auscultation bilaterally. No wheezes, rales, or rhonchi. No distress.  Heart:  S1, S2 present without murmurs appreciated.  Abdomen:  +BS, soft, non-tender and non-distended. No HSM noted. No guarding or rebound. No masses appreciated.  Rectal:  Deferred  Msk:  Symmetrical without  gross deformities. Normal posture. Extremities:  Without edema. Neurologic:  Alert and  oriented x4;  grossly normal neurologically. Skin:  Intact without significant lesions or rashes. Psych:  Alert and cooperative. Normal mood and affect.    Assessment:     Plan:  ***   Aliene Altes, PA-C [facility] 04/08/2022

## 2022-04-07 ENCOUNTER — Telehealth: Payer: Self-pay | Admitting: Family Medicine

## 2022-04-07 NOTE — Telephone Encounter (Signed)
Returned patient's call. LMTCB 

## 2022-04-07 NOTE — Telephone Encounter (Signed)
Patient called in regard to lab results Wants a call back in regard to liver levels

## 2022-04-08 ENCOUNTER — Ambulatory Visit: Payer: Medicare Other | Admitting: Gastroenterology

## 2022-04-09 ENCOUNTER — Telehealth: Payer: Self-pay | Admitting: Gastroenterology

## 2022-04-09 NOTE — Telephone Encounter (Signed)
Patient is wanting to see if someone can tell her what her new patient appt will entail.  She wants to know what will be done when she comes into the office.

## 2022-04-09 NOTE — Telephone Encounter (Signed)
Spoke to pt, she was inquiring about what will take place at her OV. She wants to know how you will check her liver and if she should be concerned. She would like an early OV than March 4.

## 2022-04-10 ENCOUNTER — Telehealth: Payer: Self-pay | Admitting: Family Medicine

## 2022-04-10 ENCOUNTER — Other Ambulatory Visit: Payer: Self-pay | Admitting: Cardiology

## 2022-04-10 NOTE — Telephone Encounter (Signed)
Pt has some questions about the referral to [facility]. Wants to ask questions about liver labs. Also needs to know if a med is due for refill?

## 2022-04-10 NOTE — Telephone Encounter (Signed)
Left detailed message for her as to what to expect for her office visit.

## 2022-04-10 NOTE — Telephone Encounter (Signed)
Spoke with patient.

## 2022-04-15 ENCOUNTER — Encounter: Payer: Self-pay | Admitting: Internal Medicine

## 2022-04-15 ENCOUNTER — Telehealth: Payer: Self-pay | Admitting: Cardiology

## 2022-04-15 NOTE — Telephone Encounter (Signed)
Will forward to provider for recommendations

## 2022-04-15 NOTE — Telephone Encounter (Signed)
Reports that she does not drink and does not take tylenol Reports she has already started holding atorvastatin for past 3 days and she decided to do this on her own. Says she has been referred to GI and has an appointment on 05/04/2022.  Patient informed and verbalized understanding of plan.

## 2022-04-15 NOTE — Telephone Encounter (Signed)
Her pcp did blood work and her liver count is high. Want to discuss things with dr. Please advise

## 2022-04-15 NOTE — Telephone Encounter (Signed)
Error

## 2022-04-21 ENCOUNTER — Other Ambulatory Visit: Payer: Self-pay | Admitting: Nurse Practitioner

## 2022-04-21 DIAGNOSIS — I6523 Occlusion and stenosis of bilateral carotid arteries: Secondary | ICD-10-CM

## 2022-04-30 ENCOUNTER — Encounter: Payer: Self-pay | Admitting: Radiology

## 2022-05-04 ENCOUNTER — Ambulatory Visit: Payer: Medicare Other | Admitting: Gastroenterology

## 2022-05-04 ENCOUNTER — Telehealth: Payer: Self-pay | Admitting: *Deleted

## 2022-05-04 ENCOUNTER — Encounter: Payer: Self-pay | Admitting: Gastroenterology

## 2022-05-04 VITALS — BP 117/73 | HR 76 | Temp 97.4°F | Ht 63.0 in | Wt 148.0 lb

## 2022-05-04 DIAGNOSIS — R7989 Other specified abnormal findings of blood chemistry: Secondary | ICD-10-CM | POA: Diagnosis not present

## 2022-05-04 DIAGNOSIS — K219 Gastro-esophageal reflux disease without esophagitis: Secondary | ICD-10-CM | POA: Diagnosis not present

## 2022-05-04 DIAGNOSIS — K5903 Drug induced constipation: Secondary | ICD-10-CM | POA: Diagnosis not present

## 2022-05-04 NOTE — Patient Instructions (Addendum)
Am ordering labs for you to have completed in the near future to further evaluate your liver enzymes.   We are scheduling you for an [place] in the near future as well.   Follow a fatty liver diet. Handout attached.   Please fill out a request up front to request your prior colonoscopy records.   Continue to take fiber daily and miralax 1 capful once daily. Continue pantoprazole 40 mg once daily.   Follow up in 4 month, sooner if needed.   It was a pleasure to see you today. I want to create trusting relationships with patients. If you receive a survey regarding your visit,  I greatly appreciate you taking time to fill this out on paper or through your MyChart. I value your feedback.  Venetia Night, MSN, FNP-BC, AGACNP-BC [facility]

## 2022-05-04 NOTE — Telephone Encounter (Signed)
RUQ [place] is scheduled for 05/18/22, arrive at 9:15 am, nothing to eat or drink after midnight.   [place]

## 2022-05-04 NOTE — Progress Notes (Signed)
GI Office Note    Referring Provider: Damaris Hippo* Primary Care Physician:  Juanda Chance, [place]  Primary Gastroenterologist: Elon Alas. Abbey Chatters, DO  Chief Complaint   Chief Complaint  Patient presents with   elevated liver count    History of Present Illness   [name] is a 77 y.o. female presenting today at the request of [name], Harlan Stains* for elevated LFTs.   Last colonoscopy in 2020. No report available for review.   Labs 03/11/22: CBC normal. A1c 5.6. Normal Lipid panel. Hep C Ab and HIV negative. Vit D normal. TSH normal. Cr 1.1, GFR 52, Na 146, alk phos 146, AST 62, ALT 61  Today: Where was colonoscopy performed - Done in [place]. Dr. Truman Hayward? Reports she has had several in the past due to constipation related to pain medications. Very small polyps.   Constipation - Takes fiber 2-3 capsules daily and smooth lax 1 capful at least 2-3 times per week. Has trouble forgetting the smooth lax due to the timing of her other medications. Has abdominal pain at times related to her medications and improves with BM. Drinks aot of water on a daily basis. Uses suppositories as needed - about 1 every 2 weeks.   GERD - pantoprazole 40 mg once daily controls symptoms.   Reports she never had elevated liver enzymes before in the past until her atorvastatin dose was doubled. Years ago she was on simvastatin but after heart issue in June she was switched to atorvastatin and added plavix, ASA, farxiga. Does not take lasix everyday.   No herbal supplements. Does drink boost daily. No tattoos. Thinks she had a blood transfusion in early 2000's. No NSAIDs currently. Occasional wine but not regular basis.  Denies any nausea, vomiting, pruritus, jaundice, loss of appetite, abdominal pain.   Current Outpatient Medications  Medication Sig Dispense Refill   ALPRAZolam (XANAX) 0.5 MG tablet Take 0.5 mg by mouth 2 (two) times daily as needed.     aspirin EC 81 MG  tablet Take 1 tablet (81 mg total) by mouth daily. Swallow whole. 90 tablet 3   bisacodyl (DULCOLAX) 10 MG suppository Place 1 suppository (10 mg total) rectally daily as needed for moderate constipation. 12 suppository 0   Calcium Carb-Cholecalciferol (OYSTER SHELL CALCIUM W/D) 500-5 MG-MCG TABS Take by mouth.     clopidogrel (PLAVIX) 75 MG tablet Take 1 tablet (75 mg total) by mouth daily. 30 tablet 6   cyanocobalamin (,VITAMIN B-12,) 1000 MCG/ML injection Inject 1 mcg into the muscle every 30 (thirty) days.     dapagliflozin propanediol (FARXIGA) 10 MG TABS tablet TAKE ONE TABLET BY MOUTH EVERY DAY 90 tablet 1   diclofenac Sodium (VOLTAREN) 1 % GEL Apply 2 g topically 4 (four) times daily as needed (pain).     DULoxetine (CYMBALTA) 30 MG capsule Take 30 mg by mouth 3 (three) times daily.     fluocinonide gel (LIDEX) 0.05 %      fluticasone (FLONASE) 50 MCG/ACT nasal spray Place 2 sprays into both nostrils daily as needed for allergies.     furosemide (LASIX) 40 MG tablet Take 1 tablet (40 mg total) by mouth as needed (shortness of breath or weight gain). 30 tablet 3   gabapentin (NEURONTIN) 800 MG tablet Take 800 mg by mouth 3 (three) times daily.     losartan (COZAAR) 25 MG tablet Take 1 tablet (25 mg total) by mouth daily. 90 tablet 0   nitroGLYCERIN (  NITROSTAT) 0.4 MG SL tablet Place 1 tablet (0.4 mg total) under the tongue every 5 (five) minutes x 3 doses as needed for chest pain. 25 tablet 12   oxycodone (ROXICODONE) 30 MG immediate release tablet Take 30 mg by mouth 3 (three) times daily as needed for pain.     pantoprazole (PROTONIX) 40 MG tablet Take 40 mg by mouth daily.     polyethylene glycol powder (GLYCOLAX/MIRALAX) 17 GM/SCOOP powder Take 1 Container by mouth once.     potassium chloride SA (KLOR-CON M) 20 MEQ tablet Take 1 tablet (20 mEq total) by mouth as needed (only on the days that you take the Lasix). 30 tablet 3   No current facility-administered medications for this visit.     Past Medical History:  Diagnosis Date   Arthritis    CAD (coronary artery disease)    a. cath in 07/2021 showing severe multivessel CAD --> not felt to be a CABG candidate and limited viable myocardium --> received DESx2 to LAD   CHF (congestive heart failure) ([place])    a. EF 30-35% by echo in 07/2021, normalized to 60-65% by repeat imaging later that month.   Hx of non-Hodgkin's lymphoma    Hypertension    Left ventricular apical thrombus    a. noted on cMRI in 07/2021    Past Surgical History:  Procedure Laterality Date   CORONARY STENT INTERVENTION N/A 08/08/2021   Procedure: CORONARY STENT INTERVENTION;  Surgeon: Sherren Mocha, MD;  Location: [place] [facility];  Service: Cardiovascular;  Laterality: N/A;   INTRAVASCULAR ULTRASOUND/IVUS N/A 08/08/2021   Procedure: Intravascular Ultrasound/IVUS;  Surgeon: Sherren Mocha, MD;  Location: [place] [facility];  Service: Cardiovascular;  Laterality: N/A;   LEFT HEART CATH AND CORONARY ANGIOGRAPHY N/A 08/02/2021   Procedure: LEFT HEART CATH AND CORONARY ANGIOGRAPHY;  Surgeon: Nelva Bush, MD;  Location: [place] [facility];  Service: Cardiovascular;  Laterality: N/A;    Family History  Problem Relation Age of Onset   Aortic aneurysm Mother    Coronary artery disease Brother     Allergies as of 05/04/2022 - Review Complete 05/04/2022  Allergen Reaction Noted   Morphine Rash 08/22/2014    Social History   Socioeconomic History   Marital status: Widowed    Spouse name: Not on file   Number of children: Not on file   Years of education: Not on file   Highest education level: Not on file  Occupational History   Not on file  Tobacco Use   Smoking status: Every Day    Types: Cigarettes   Smokeless tobacco: Never  Vaping Use   Vaping Use: Never used  Substance and Sexual Activity   Alcohol use: Not Currently   Drug use: Never   Sexual activity: Not on file  Other Topics Concern   Not on file  Social History Narrative    Not on file   Social Determinants of Health   Financial Resource Strain: Not on file  Food Insecurity: Not on file  Transportation Needs: Not on file  Physical Activity: Not on file  Stress: Not on file  Social Connections: Not on file  Intimate Partner Violence: Not on file     Review of Systems   Gen: Denies any fever, chills, fatigue, weight loss, lack of appetite.  CV: Denies chest pain, heart palpitations, peripheral edema, syncope.  Resp: Denies shortness of breath at rest or with exertion. Denies wheezing or cough.  GI: see HPI GU : Denies  urinary burning, urinary frequency, urinary hesitancy MS: + joint pain. Denies muscle weakness, cramps, or limitation of movement.  Derm: Denies rash, itching, dry skin Psych: Denies depression, anxiety, memory loss, and confusion Heme: Denies bruising, bleeding, and enlarged lymph nodes.   Physical Exam   BP 117/73 (BP Location: Right Arm, Patient Position: Sitting, Cuff Size: Normal)   Pulse 76   Temp (!) 97.4 F (36.3 C) (Temporal)   Ht '5\' 3"'$  (1.6 m)   Wt 148 lb (67.1 kg)   SpO2 94%   BMI 26.22 kg/m   General:   Alert and oriented. Pleasant and cooperative. Well-nourished and well-developed.  Head:  Normocephalic and atraumatic. Eyes:  Without icterus, sclera clear and conjunctiva pink.  Ears:  Normal auditory acuity. Mouth:  No deformity or lesions, oral mucosa pink.  Lungs:  Clear to auscultation bilaterally. No wheezes, rales, or rhonchi. No distress.  Heart:  S1, S2 present without murmurs appreciated.  Abdomen:  +BS, soft, non-tender and non-distended. No HSM noted. No guarding or rebound. No masses appreciated.  Rectal:  Deferred Msk:  Normal posture. Chronic swelling to Left knee with scarring and evidence of varicose veins.  Extremities:  swelling to LLE (knee) Neurologic:  Alert and  oriented x4;  grossly normal neurologically. Skin:  Intact without significant lesions or rashes. Psych:  Alert and  cooperative. Normal mood and affect.   Assessment   SAMEEN STAIR is a 77 y.o. female with a history of arthritis, CAD s/p stents x2 to LAD in June 2023, CHF, non hodgkin's lymphoma, HTN presenting today for evaluation of elevated LFTs.   Elevated LFTs: Labs in January 2024 with alk phos 146, AST 62, ALT 61.  Lipid panel normal at that time however she was also on atorvastatin 80 mg once daily.  She states that she previously was on simvastatin and has never had elevated liver enzymes before however she was started on atorvastatin 80 mg once daily after her cardiac issues she experienced in June 2023.  Pattern indicates cholestatic injury.  She denies any RUQ pain, nausea, vomiting.  Jaundice, pruritus.  Will complete basic workup to check viral hepatitis, autoimmune serologies, and right upper quadrant ultrasound for further evaluation of cholelithiasis or choledocholithiasis.  Will also reassess LFTs given it has been almost 2 months since her prior lab work.  High-dose atorvastatin could be likely cause for elevation as well although hepatocellular pattern would be more consistent with this.  Will have further recommendations after lab work and ultrasound completed.  Did discuss fatty liver with her today as a possibility given her hyperlipidemia, hypertension, and cardiac history.  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 Stay well hydrated. Takes daily fiber supplement and needs to be more consistent with miralax use. Increase exercise as tolerated.   GERD: Controlled with pantoprazole 40 mg once daily.   Patient did report history of colon polyps however stated her last colonoscopy was in 2020.  Will request her prior records to determine possible timing for surveillance.    PLAN   CMP, Hep A Ab, Hep B sAg, ANA, AMA, ASMA, IgG, IgM, IgA RUQ [place] Fatty liver diet Continue to hold atorvastatin for now. May consider restarting low dose and discuss in the future.  Request prior colonoscopy  records Continue miralax 1 capful daily.  Continue pantoprazole 40 mg daily.  Follow up in 4 months.    Venetia Night, MSN, FNP-BC, AGACNP-BC [facility]

## 2022-05-07 ENCOUNTER — Telehealth: Payer: Self-pay | Admitting: *Deleted

## 2022-05-07 ENCOUNTER — Ambulatory Visit (HOSPITAL_COMMUNITY): Payer: Medicare Other | Admitting: Student in an Organized Health Care Education/Training Program

## 2022-05-07 NOTE — Telephone Encounter (Signed)
Pt called and wants to have labs results and Ct scan results sent to her PCP in [facility].

## 2022-05-07 NOTE — Progress Notes (Deleted)
Psychiatric Initial Adult Assessment   Patient Identification: [name] MRN:  [id number] Date of Evaluation:  05/07/2022 Referral Source: PCP Chief Complaint:  No chief complaint on file.  Visit Diagnosis: No diagnosis found.  History of Present Illness:    Patient presents endorsing concerns for worsening anxiety and depression. Patient is currently prescribed Xanax 0.'5mg'$  BID.  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   Recently lost 2 daughter.   Associated Signs/Symptoms: Depression Symptoms:  {DEPRESSION SYMPTOMS:20000} (Hypo) Manic Symptoms:  {BHH MANIC SYMPTOMS:22872} Anxiety Symptoms:  {BHH ANXIETY SYMPTOMS:22873} Psychotic Symptoms:  {BHH PSYCHOTIC SYMPTOMS:22874} PTSD Symptoms: {BHH PTSD SYMPTOMS:22875}  Past Psychiatric History: ***  Previous Psychotropic Medications: {YES/NO:21197}  Substance Abuse History in the last 12 months:  {yes no:314532}  Consequences of Substance Abuse: {BHH CONSEQUENCES OF SUBSTANCE ABUSE:22880}  Past Medical History:  Past Medical History:  Diagnosis Date   Arthritis    CAD (coronary artery disease)    a. cath in 07/2021 showing severe multivessel CAD --> not felt to be a CABG candidate and limited viable myocardium --> received DESx2 to LAD   CHF (congestive heart failure) (HCC)    a. EF 30-35% by echo in 07/2021, normalized to 60-65% by repeat imaging later that month.   Hx of non-Hodgkin's lymphoma    Hyperlipidemia    Hypertension    Left ventricular apical thrombus    a. noted on cMRI in 07/2021    Past Surgical History:  Procedure Laterality Date   CORONARY STENT INTERVENTION N/A 08/08/2021   Procedure: CORONARY STENT INTERVENTION;  Surgeon: Sherren Mocha, MD;  Location: [facility];  Service: Cardiovascular;  Laterality: N/A;   INTRAVASCULAR ULTRASOUND/IVUS N/A 08/08/2021   Procedure: Intravascular  Ultrasound/IVUS;  Surgeon: Sherren Mocha, MD;  Location: [facility];  Service: Cardiovascular;  Laterality: N/A;   LEFT HEART CATH AND CORONARY ANGIOGRAPHY N/A 08/02/2021   Procedure: LEFT HEART CATH AND CORONARY ANGIOGRAPHY;  Surgeon: Nelva Bush, MD;  Location: [facility];  Service: Cardiovascular;  Laterality: N/A;    Family Psychiatric History: ***  Family History:  Family History  Problem Relation Age of Onset   Aortic aneurysm Mother    Coronary artery disease Brother     Social History:   Social History   Socioeconomic History   Marital status: Widowed    Spouse name: Not on file   Number of children: Not on file   Years of education: Not on file   Highest education level: Not on file  Occupational History   Not on file  Tobacco Use   Smoking status: Every Day    Types: Cigarettes   Smokeless tobacco: Never  Vaping Use   Vaping Use: Never used  Substance and Sexual Activity   Alcohol use: Not Currently   Drug use: Never   Sexual activity: Not on file  Other Topics Concern   Not on file  Social History Narrative   Not on file   Social Determinants of Health   Financial Resource Strain: Not on file  Food Insecurity: Not on file  Transportation Needs: Not on file  Physical Activity: Not on file  Stress: Not on file  Social Connections: Not on file    Additional Social History: ***  Allergies:   Allergies  Allergen Reactions   Morphine Rash    Metabolic Disorder Labs: Lab Results  Component Value Date  HGBA1C 5.6 03/11/2022   MPG 114.02 08/02/2021   No results found for: "PROLACTIN" Lab Results  Component Value Date   CHOL 162 03/11/2022   TRIG 93 03/11/2022   HDL 56 03/11/2022   CHOLHDL 2.9 03/11/2022   VLDL 25 08/02/2021   LDLCALC 89 03/11/2022   LDLCALC 80 08/02/2021   Lab Results  Component Value Date   TSH 2.660 03/11/2022    Therapeutic Level Labs: No results found for: "LITHIUM" No results found for:  "CBMZ" No results found for: "VALPROATE"  Current Medications: Current Outpatient Medications  Medication Sig Dispense Refill   ALPRAZolam (XANAX) 0.5 MG tablet Take 0.5 mg by mouth 2 (two) times daily as needed.     aspirin EC 81 MG tablet Take 1 tablet (81 mg total) by mouth daily. Swallow whole. 90 tablet 3   bisacodyl (DULCOLAX) 10 MG suppository Place 1 suppository (10 mg total) rectally daily as needed for moderate constipation. 12 suppository 0   Calcium Carb-Cholecalciferol (OYSTER SHELL CALCIUM W/D) 500-5 MG-MCG TABS Take by mouth.     clopidogrel (PLAVIX) 75 MG tablet Take 1 tablet (75 mg total) by mouth daily. 30 tablet 6   cyanocobalamin (,VITAMIN B-12,) 1000 MCG/ML injection Inject 1 mcg into the muscle every 30 (thirty) days.     dapagliflozin propanediol (FARXIGA) 10 MG TABS tablet TAKE ONE TABLET BY MOUTH EVERY DAY 90 tablet 1   diclofenac Sodium (VOLTAREN) 1 % GEL Apply 2 g topically 4 (four) times daily as needed (pain).     DULoxetine (CYMBALTA) 30 MG capsule Take 30 mg by mouth 3 (three) times daily.     fluocinonide gel (LIDEX) 0.05 %      fluticasone (FLONASE) 50 MCG/ACT nasal spray Place 2 sprays into both nostrils daily as needed for allergies.     furosemide (LASIX) 40 MG tablet Take 1 tablet (40 mg total) by mouth as needed (shortness of breath or weight gain). 30 tablet 3   gabapentin (NEURONTIN) 800 MG tablet Take 800 mg by mouth 3 (three) times daily.     losartan (COZAAR) 25 MG tablet Take 1 tablet (25 mg total) by mouth daily. 90 tablet 0   nitroGLYCERIN (NITROSTAT) 0.4 MG SL tablet Place 1 tablet (0.4 mg total) under the tongue every 5 (five) minutes x 3 doses as needed for chest pain. 25 tablet 12   oxycodone (ROXICODONE) 30 MG immediate release tablet Take 30 mg by mouth 3 (three) times daily as needed for pain.     pantoprazole (PROTONIX) 40 MG tablet Take 40 mg by mouth daily.     polyethylene glycol powder (GLYCOLAX/MIRALAX) 17 GM/SCOOP powder Take 1  Container by mouth once.     potassium chloride SA (KLOR-CON M) 20 MEQ tablet Take 1 tablet (20 mEq total) by mouth as needed (only on the days that you take the Lasix). 30 tablet 3   No current facility-administered medications for this visit.    Musculoskeletal: Strength & Muscle Tone: {desc; muscle tone:32375} Gait & Station: {PE GAIT ED EF:6704556 Patient leans: {Patient Leans:21022755}  Psychiatric Specialty Exam: Review of Systems  There were no vitals taken for this visit.There is no height or weight on file to calculate BMI.  General Appearance: {Appearance:22683}  Eye Contact:  {BHH EYE CONTACT:22684}  Speech:  {Speech:22685}  Volume:  {Volume (PAA):22686}  Mood:  {BHH MOOD:22306}  Affect:  {Affect (PAA):22687}  Thought Process:  {Thought Process (PAA):22688}  Orientation:  {BHH ORIENTATION (PAA):22689}  Thought Content:  {Thought Content:22690}  Suicidal Thoughts:  {ST/HT (PAA):22692}  Homicidal Thoughts:  {ST/HT (PAA):22692}  Memory:  {BHH NO:566101  Judgement:  {Judgement (PAA):22694}  Insight:  {Insight (PAA):22695}  Psychomotor Activity:  {Psychomotor (PAA):22696}  Concentration:  {Concentration:21399}  Recall:  {BHH GOOD/FAIR/POOR:22877}  Fund of Knowledge:{BHH GOOD/FAIR/POOR:22877}  Language: {BHH GOOD/FAIR/POOR:22877}  Akathisia:  {BHH YES OR NO:22294}  Handed:  {Handed:22697}  AIMS (if indicated):  {Desc; done/not:10129}  Assets:  {Assets (PAA):22698}  ADL's:  {BHH XO:4411959  Cognition: {chl bhh cognition:304700322}  Sleep:  {BHH GOOD/FAIR/POOR:22877}   Screenings: Flowsheet Row ED to Hosp-Admission (Discharged) from 08/25/2021 in [facility] ED to Hosp-Admission (Discharged) from 08/02/2021 in [facility] from 02/26/2021 in [facility] Emergency Department at [place] No Risk No Risk No Risk       Assessment and Plan: ***  Collaboration of Care: {BH OP Collaboration of  Care:21014065}  Patient/Guardian was advised Release of Information must be obtained prior to any record release in order to collaborate their care with an outside provider. Patient/Guardian was advised if they have not already done so to contact the registration department to sign all necessary forms in order for [place] to release information regarding their care.   Consent: Patient/Guardian gives verbal consent for treatment and assignment of benefits for services provided during this visit. Patient/Guardian expressed understanding and agreed to proceed.    PGY-3 Freida Busman, MD 3/7/202411:30 AM

## 2022-05-08 ENCOUNTER — Telehealth: Payer: Self-pay

## 2022-05-08 NOTE — Telephone Encounter (Signed)
Noted  

## 2022-05-08 NOTE — Telephone Encounter (Signed)
Requested records are scanned under media for review.  

## 2022-05-11 NOTE — Telephone Encounter (Signed)
Noted  

## 2022-05-14 ENCOUNTER — Telehealth: Payer: Self-pay | Admitting: Gastroenterology

## 2022-05-14 NOTE — Telephone Encounter (Signed)
Reviewed prior colonoscopy records.   Last colonoscopy in December 2020: -3 polyps ranging 3-6 mm in the cecum, ascending, and descending colon.  -pathology revealed 1 sessile serrated adenoma and 2 tubular adenomas.   Venetia Night, MSN, APRN, FNP-BC, AGACNP-BC [facility] at [facility]

## 2022-05-18 ENCOUNTER — Ambulatory Visit (HOSPITAL_COMMUNITY)
Admission: RE | Admit: 2022-05-18 | Discharge: 2022-05-18 | Disposition: A | Discharge status: Home / Self Care | Payer: Medicare Other | Source: Ambulatory Visit | Attending: Gastroenterology | Admitting: Gastroenterology

## 2022-05-18 ENCOUNTER — Other Ambulatory Visit (HOSPITAL_COMMUNITY)
Admission: RE | Admit: 2022-05-18 | Discharge: 2022-05-18 | Disposition: A | Discharge status: Home / Self Care | Payer: Medicare Other | Source: Ambulatory Visit | Attending: Gastroenterology | Admitting: Gastroenterology

## 2022-05-18 ENCOUNTER — Other Ambulatory Visit: Payer: Self-pay | Admitting: *Deleted

## 2022-05-18 DIAGNOSIS — R7989 Other specified abnormal findings of blood chemistry: Secondary | ICD-10-CM

## 2022-05-18 DIAGNOSIS — K219 Gastro-esophageal reflux disease without esophagitis: Secondary | ICD-10-CM

## 2022-05-18 LAB — COMPREHENSIVE METABOLIC PANEL
ALT: 21 U/L (ref 0–44)
AST: 31 U/L (ref 15–41)
Albumin: 4 g/dL (ref 3.5–5.0)
Alkaline Phosphatase: 76 U/L (ref 38–126)
Anion gap: 8 (ref 5–15)
BUN: 20 mg/dL (ref 8–23)
CO2: 26 mmol/L (ref 22–32)
Calcium: 9.2 mg/dL (ref 8.9–10.3)
Chloride: 103 mmol/L (ref 98–111)
Creatinine, Ser: 1.01 mg/dL — ABNORMAL HIGH (ref 0.44–1.00)
GFR, Estimated: 57 mL/min — ABNORMAL LOW (ref >60)
Glucose, Bld: 103 mg/dL — ABNORMAL HIGH (ref 70–99)
Potassium: 4.1 mmol/L (ref 3.5–5.1)
Sodium: 137 mmol/L (ref 135–145)
Total Bilirubin: 0.6 mg/dL (ref 0.3–1.2)
Total Protein: 7.2 g/dL (ref 6.5–8.1)

## 2022-05-18 LAB — HEPATITIS B SURFACE ANTIGEN: Hepatitis B Surface Ag: NONREACTIVE

## 2022-05-18 LAB — HEPATITIS A ANTIBODY, TOTAL: hep A Total Ab: NONREACTIVE

## 2022-05-18 LAB — HEPATITIS C ANTIBODY: HCV Ab: NONREACTIVE

## 2022-05-18 LAB — HEPATITIS A ANTIBODY, IGM: Hep A IgM: NONREACTIVE

## 2022-05-19 LAB — ANTI-SMOOTH MUSCLE ANTIBODY, IGG: F-Actin IgG: 60 Units — ABNORMAL HIGH (ref 0–19)

## 2022-05-19 LAB — MITOCHONDRIAL ANTIBODIES: Mitochondrial M2 Ab, IgG: <20 Units (ref 0.0–20.0)

## 2022-05-19 LAB — IGG, IGA, IGM
IgA: 175 mg/dL (ref 64–422)
IgG (Immunoglobin G), Serum: 958 mg/dL (ref 586–1602)
IgM (Immunoglobulin M), Srm: 28 mg/dL (ref 26–217)

## 2022-05-19 LAB — ANTINUCLEAR ANTIBODIES, IFA: ANA Ab, IFA: NEGATIVE

## 2022-05-20 ENCOUNTER — Encounter: Payer: Self-pay | Admitting: *Deleted

## 2022-05-20 ENCOUNTER — Other Ambulatory Visit: Payer: Self-pay | Admitting: *Deleted

## 2022-05-20 DIAGNOSIS — R7989 Other specified abnormal findings of blood chemistry: Secondary | ICD-10-CM

## 2022-05-21 ENCOUNTER — Ambulatory Visit: Payer: Medicare Other | Admitting: Nurse Practitioner

## 2022-05-25 ENCOUNTER — Ambulatory Visit: Payer: Medicare Other | Admitting: Internal Medicine

## 2022-05-26 ENCOUNTER — Ambulatory Visit: Payer: Medicare Other | Attending: Nurse Practitioner | Admitting: Nurse Practitioner

## 2022-05-26 ENCOUNTER — Encounter: Payer: Self-pay | Admitting: Nurse Practitioner

## 2022-05-26 ENCOUNTER — Ambulatory Visit: Payer: Medicare Other | Admitting: Internal Medicine

## 2022-05-26 VITALS — BP 134/72 | HR 84 | Ht 62.0 in | Wt 149.4 lb

## 2022-05-26 DIAGNOSIS — I513 Intracardiac thrombosis, not elsewhere classified: Secondary | ICD-10-CM

## 2022-05-26 DIAGNOSIS — E785 Hyperlipidemia, unspecified: Secondary | ICD-10-CM

## 2022-05-26 DIAGNOSIS — I255 Ischemic cardiomyopathy: Secondary | ICD-10-CM

## 2022-05-26 DIAGNOSIS — I5022 Chronic systolic (congestive) heart failure: Secondary | ICD-10-CM | POA: Diagnosis not present

## 2022-05-26 DIAGNOSIS — I10 Essential (primary) hypertension: Secondary | ICD-10-CM

## 2022-05-26 DIAGNOSIS — R0989 Other specified symptoms and signs involving the circulatory and respiratory systems: Secondary | ICD-10-CM

## 2022-05-26 DIAGNOSIS — I2510 Atherosclerotic heart disease of native coronary artery without angina pectoris: Secondary | ICD-10-CM

## 2022-05-26 DIAGNOSIS — I779 Disorder of arteries and arterioles, unspecified: Secondary | ICD-10-CM

## 2022-05-26 DIAGNOSIS — Z79899 Other long term (current) drug therapy: Secondary | ICD-10-CM

## 2022-05-26 MED ORDER — LOSARTAN POTASSIUM 25 MG PO TABS: 12.5000 mg | ORAL_TABLET | Freq: Every day | ORAL | 1 refills | Status: DC

## 2022-05-26 NOTE — Progress Notes (Addendum)
Cardiology Office Note:    Date:  05/26/2022  ID:  Karliyah, Krausse 05/26/1945, MRN [id number]  PCP:  Laqueta Due., MD   [facility] Providers Cardiologist:  Dina Rich, MD     Referring MD: Laqueta Due., MD   CC: Here for follow-up  History of Present Illness:    CAGNEY FALSO is a very pleasant 77 y.o. 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   Admitted 07/2021 with recurrent chest pain, troponins negative, and EKG was unchanged.  Underwent cardiac catheterization that showed severe multivessel CAD, was not felt to be a candidate for CABG, received 2 drug-eluting stents to LAD.  TTE showed EF  30 to 35%.  Follow up TTE revealed normal EF 60 to 65%, no thrombus. Had bradycardia, beta-blocker was stopped.    Seen by [place] Scrape, PA-C on September 08, 2021 for follow-up. CC was bruising, feeling weak.  Denied any chest pain.  Marcelino Duster recommended that when she had completed 3 months of Eliquis for apical thrombus noted on cMRI and echocardiogram, would likely need to restart aspirin at that time and continue Plavix.  Plan was to stop Eliquis on November 09, 2021 for a total of 3 months of anticoagulation, and then restart aspirin.   At last OV, she had stopped Eliquis and remained on Aspirin. Was overall doing very well. Admitted to some memory loss.  Was requesting referral to new primary care provider.    Today she presents for follow-up. Overall doing well from a cardiac perspective. Takes Losartan occasionally, not daily. Denies any chest pain, shortness of breath, palpitations, syncope, presyncope, dizziness, orthopnea, PND, swelling or significant weight changes, acute bleeding, or claudication.  SH: Retired Engineer, civil (consulting) Past Medical  History:  Diagnosis Date   Arthritis    CAD (coronary artery disease)    a. cath in 07/2021 showing severe multivessel CAD --> not felt to be a CABG candidate and limited viable myocardium --> received DESx2 to LAD   CHF (congestive heart failure) (HCC)    a. EF 30-35% by echo in 07/2021, normalized to 60-65% by repeat imaging later that month.   Hx of non-Hodgkin's lymphoma    Hyperlipidemia    Hypertension    Left ventricular apical thrombus    a. noted on cMRI in 07/2021    Past Surgical History:  Procedure Laterality Date   CORONARY STENT INTERVENTION N/A 08/08/2021   Procedure: CORONARY STENT INTERVENTION;  Surgeon: Tonny Bollman, MD;  Location: [facility] INVASIVE CV LAB;  Service: Cardiovascular;  Laterality: N/A;   INTRAVASCULAR ULTRASOUND/IVUS N/A 08/08/2021   Procedure: Intravascular Ultrasound/IVUS;  Surgeon: Tonny Bollman, MD;  Location: [facility] INVASIVE CV LAB;  Service: Cardiovascular;  Laterality: N/A;   LEFT HEART CATH AND CORONARY ANGIOGRAPHY N/A 08/02/2021   Procedure: LEFT HEART CATH AND CORONARY ANGIOGRAPHY;  Surgeon: Yvonne Kendall, MD;  Location: [facility] INVASIVE CV LAB;  Service: Cardiovascular;  Laterality: N/A;   Current Meds  Medication Sig   ALPRAZolam (XANAX) 0.5 MG tablet Take 0.5 mg by mouth 2 (two) times daily as needed.   aspirin EC 81 MG tablet Take 1 tablet (81 mg total) by mouth daily. Swallow whole.   bisacodyl (DULCOLAX) 10 MG suppository Place 1 suppository (10 mg total) rectally daily as  needed for moderate constipation.   Calcium Carb-Cholecalciferol (OYSTER SHELL CALCIUM W/D) 500-5 MG-MCG TABS Take by mouth.   clopidogrel (PLAVIX) 75 MG tablet Take 1 tablet (75 mg total) by mouth daily.   cyanocobalamin (,VITAMIN B-12,) 1000 MCG/ML injection Inject 1 mcg into the muscle every 30 (thirty) days.   dapagliflozin propanediol (FARXIGA) 10 MG TABS tablet TAKE ONE TABLET BY MOUTH EVERY DAY   diclofenac Sodium (VOLTAREN) 1 % GEL Apply 2 g topically 4 (four) times daily  as needed (pain).   DULoxetine (CYMBALTA) 30 MG capsule Take 30 mg by mouth 3 (three) times daily.   fluocinonide gel (LIDEX) 0.05 %    fluticasone (FLONASE) 50 MCG/ACT nasal spray Place 2 sprays into both nostrils daily as needed for allergies.   furosemide (LASIX) 40 MG tablet Take 1 tablet (40 mg total) by mouth as needed (shortness of breath or weight gain).   gabapentin (NEURONTIN) 800 MG tablet Take 800 mg by mouth 3 (three) times daily.   nitroGLYCERIN (NITROSTAT) 0.4 MG SL tablet Place 1 tablet (0.4 mg total) under the tongue every 5 (five) minutes x 3 doses as needed for chest pain.   oxycodone (ROXICODONE) 30 MG immediate release tablet Take 30 mg by mouth 3 (three) times daily as needed for pain.   pantoprazole (PROTONIX) 40 MG tablet Take 40 mg by mouth daily.   polyethylene glycol powder (GLYCOLAX/MIRALAX) 17 GM/SCOOP powder Take 1 Container by mouth once.   potassium chloride SA (KLOR-CON M) 20 MEQ tablet Take 1 tablet (20 mEq total) by mouth as needed (only on the days that you take the Lasix).   losartan (COZAAR) 25 MG tablet Take 1 tablet (25 mg total) by mouth daily.   Allergies:   Morphine   Social History   Socioeconomic History   Marital status: Widowed    Spouse name: Not on file   Number of children: Not on file   Years of education: Not on file   Highest education level: Not on file  Occupational History   Not on file  Tobacco Use   Smoking status: Some Days    Types: Cigarettes    Passive exposure: Never   Smokeless tobacco: Never  Vaping Use   Vaping Use: Never used  Substance and Sexual Activity   Alcohol use: Not Currently   Drug use: Never   Sexual activity: Not on file  Other Topics Concern   Not on file  Social History Narrative   Not on file   Social Determinants of Health   Financial Resource Strain: Not on file  Food Insecurity: Not on file  Transportation Needs: Not on file  Physical Activity: Not on file  Stress: Not on file  Social  Connections: Not on file     Family History: The patient's family history includes Aortic aneurysm in her mother; Coronary artery disease in her brother.  ROS:   Review of Systems  Constitutional: Negative.   HENT: Negative.    Eyes: Negative.   Respiratory: Negative.    Cardiovascular: Negative.   Gastrointestinal: Negative.   Genitourinary: Negative.   Musculoskeletal: Negative.   Skin: Negative.   Neurological: Negative.   Endo/Heme/Allergies: Negative.   Psychiatric/Behavioral:  Positive for memory loss. Negative for depression, hallucinations, substance abuse and suicidal ideas. The patient is not nervous/anxious and does not have insomnia.     Please see the history of present illness.    All other systems reviewed and are negative.  EKGs/Labs/Other Studies Reviewed:  The following studies were reviewed today:   EKG:  EKG is not ordered today.   Carotid duplex 03/2022: Summary:  Right Carotid: Velocities in the right ICA are consistent with a 40-59% stenosis. The ECA appears >50% stenosed. Cannot rule out higher stenosis in the proximal RICA due to circumferential shadowing plaque.   Left Carotid: Velocities in the left ICA are consistent with a 1-39%  stenosis. Non-hemodynamically significant plaque <50% noted in the CCA.   Vertebrals: Bilateral vertebral arteries demonstrate antegrade flow.  Subclavians: Normal flow hemodynamics were seen in bilateral subclavian arteries.   See table(s) above for measurements and observations.  Suggest follow up study in 12 months.  Limited Echo on 08/26/2021:  1. Distal apical hypokinesis. No thrombus noted. . Left ventricular  ejection fraction, by estimation, is 60 to 65%. The left ventricle has  normal function. There is mild left ventricular hypertrophy.   2. Right ventricular systolic function is normal. The right ventricular  size is normal.   3. Limited echo to evaluate LV function. Much improved LVEF compared to   08/03/21 study.  Coronary stent intervention on August 08, 2021:   Mid LAD-1 lesion is 80% stenosed.   Mid LAD-2 lesion is 90% stenosed.   Post intervention, there is a 0% residual stenosis.   Post intervention, there is a 0% residual stenosis.   Successful PCI of the proximal and mid LAD tandem stenoses using a 3.0 x 20 mm Synergy DES over the proximal lesion and a 2.25 x 32 mm Synergy DES over the distal lesions.  The procedure is guided by intravascular ultrasound.   Recommendations: Dual antiplatelet therapy with aspirin and clopidogrel x12 months without interruption.  Aggressive risk reduction measures and anti-ischemic therapy for residual ischemic heart disease.  Cardiac MRI on August 04, 2021: IMPRESSION: No evidence of late gadolinium enhancement-suggestive of viable myocardium.   Small LV thrombus.  2D echocardiogram on August 03, 2021:  1. Left ventricular ejection fraction, by estimation, is 30 to 35%. The  left ventricle has moderately decreased function. The left ventricle  demonstrates regional wall motion abnormalities (see scoring  diagram/findings for description). There is  moderate asymmetric left ventricular hypertrophy of the basal-septal  segment. Left ventricular diastolic parameters are consistent with Grade I  diastolic dysfunction (impaired relaxation).   2. LV apical mural thrombus   3. Right ventricular systolic function is normal. The right ventricular  size is normal. Tricuspid regurgitation signal is inadequate for assessing  PA pressure.   4. The mitral valve is degenerative. Trivial mitral valve regurgitation.  No evidence of mitral stenosis. Moderate mitral annular calcification.   5. The aortic valve was not well visualized. Aortic valve regurgitation  is trivial. No aortic stenosis is present.   6. The inferior vena cava is normal in size with greater than 50%  respiratory variability, suggesting right atrial pressure of 3 mmHg.  Left heart cath and  coronary angiography on August 02, 2021: Conclusions: Severe three-vessel coronary artery disease including sequential 80-90% proximal and mid LAD stenoses, 70% proximal ramus intermedius lesion, multifocal LCx disease of up to 90% in the mid/distal vessel, and diffusely diseased proximal/mid RCA with up to 50% stenosis as well as sequential 90% ostial and 70% mid RPDA lesions. Moderately-severely elevated left ventricular filling pressure (LVEDP 30-35 mmHg).  LVEF suboptimally assessed but is likely mildly-moderately reduced with anterior hypokinesis.   Recommendations: Case discussed with Dr. Laneta Simmers (cardiac surgery).  There is not a clear culprit for the patient's EKG changes.  I suspect that she may have had an ischemic event several days ago with her transient chest pain and is now manifesting predominantly heart failure symptoms.  We will optimize her heart failure while discussing optimal revascularization strategy. Patient given furosemide 40 mg IV at the end of the procedure.  Continue diuresis as blood pressure and renal function tolerate. Hold home doses of metoprolol and lisinopril for now.  Reinstitute goal-directed medical therapy as tolerated based on blood pressure, renal function, and heart failure. Obtain echocardiogram. Restart IV heparin 2 hours after TR band removal.  Defer adding P2Y12 inhibitor pending cardiac surgery consultation. Aggressive secondary prevention of coronary artery disease.  Recent Labs: 08/05/2021: B Natriuretic Peptide 513.5 08/28/2021: Magnesium 1.9 03/11/2022: Hemoglobin 12.9; Platelets 252; TSH 2.660 05/18/2022: ALT 21; BUN 20; Creatinine, Ser 1.01; Potassium 4.1; Sodium 137  Recent Lipid Panel    Component Value Date/Time   CHOL 162 03/11/2022 1624   TRIG 93 03/11/2022 1624   HDL 56 03/11/2022 1624   CHOLHDL 2.9 03/11/2022 1624   CHOLHDL 3.2 08/02/2021 1906   VLDL 25 08/02/2021 1906   LDLCALC 89 03/11/2022 1624   Physical Exam:    VS:  BP 134/72  (BP Location: Left Arm, Patient Position: Sitting, Cuff Size: Normal)   Pulse 84   Ht 5\' 2"  (1.575 m)   Wt 149 lb 6.4 oz (67.8 kg)   SpO2 99%   BMI 27.33 kg/m     Wt Readings from Last 3 Encounters:  05/26/22 149 lb 6.4 oz (67.8 kg)  05/04/22 148 lb (67.1 kg)  03/11/22 144 lb (65.3 kg)     GEN: Well nourished, well developed in no acute distress HEENT: Normal NECK: No JVD; R carotid bruit without L carotid bruit noted CARDIAC: S1/S2, RRR, no murmurs, rubs, gallops; 2+ peripheral pulses  RESPIRATORY:  Clear and diminished to auscultation without rales, wheezing or rhonchi  MUSCULOSKELETAL:  No edema; No deformity  SKIN: Thin skin, warm and dry NEUROLOGIC:  Alert and oriented x 3 PSYCHIATRIC:  Normal affect   ASSESSMENT:    1. Coronary artery disease involving native heart without angina pectoris, unspecified vessel or lesion type   2. Hyperlipidemia, unspecified hyperlipidemia type   3. Heart failure with improved ejection fraction (HFimpEF) (HCC)   4. Ischemic cardiomyopathy   5. Essential hypertension, benign   6. Medication management   7. Carotid artery disease, unspecified laterality, unspecified type (HCC)   8. Bruit of right carotid artery   9. Left ventricular apical thrombus    PLAN:    In order of problems listed above:  CAD, s/p drug-eluting stents x 2 to LAD Stable with no anginal symptoms. No indication for ischemic evaluation.  Continue aspirin, atorvastatin, Plavix, and nitroglycerin as needed. Heart healthy diet and regular cardiovascular exercise encouraged.   HLD Last labs revealed total cholesterol 153, HDL 48, LDL 80, and triglycerides 161.  Lipitor currently on hold-see previous telephone note.  At next office visit, plan to discuss rechecking FLP and LFT. Heart healthy diet and regular cardiovascular exercise encouraged.   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  Limited TTE revealed improvement in  EF to 60 to 65%, mild LVH, distal apical hypokinesis. Euvolemic and well compensated on exam.  Continue aspirin, Plavix, and Farxiga. Lipitor currently on hold - see telephone note. Starting daily low dose losartan - see below. Continue current GDMT. Low sodium diet, fluid restriction <2L, and daily weights  encouraged. Educated to contact our office for weight gain of 2 lbs overnight or 5 lbs in one week. Heart healthy diet and regular cardiovascular exercise encouraged.   HTN, medication management Blood pressure today 134/72. Will start Losartan 12.5 mg daily and repeat BMET in 2 weeks per protocol. Continue rest of medication regimen. Discussed to monitor BP at home at least 2 hours after medications and sitting for 5-10 minutes. Heart healthy diet and regular cardiovascular exercise encouraged.   Carotid artery stenosis, Right carotid bruit Carotid doppler 03/2022 revealed 40-59% R ICA stenosis, 1-39% L ICA stenosis with Right ECA > 50% stenosed. Follow-up study suggested in 1 year, will arrange 03/2023. Denies any symptoms. Continue ASA, plavix, and atorvastatin. Heart healthy diet and regular cardiovascular exercise encouraged.   Hx of LV apical thrombus Previously completed 3 months of Eliquis therapy and continues on ASA therapy. Continue current medication regimen. Heart healthy diet and regular cardiovascular exercise encouraged.   Disposition: Follow-up with Dr. Dina Rich or APP in  6 months or sooner if anything changes.    Medication Adjustments/Labs and Tests Ordered: Current medicines are reviewed at length with the patient today.  Concerns regarding medicines are outlined above.  Orders Placed This Encounter  Procedures   Basic metabolic panel   Meds ordered this encounter  Medications   losartan (COZAAR) 25 MG tablet    Sig: Take 0.5 tablets (12.5 mg total) by mouth daily.    Dispense:  45 tablet    Refill:  1    05/27/22 Dose decrease    Patient Instructions   Medication Instructions:  Your physician has recommended you make the following change in your medication:  DECREASE losartan to 12.5 mg (1/2 tablet) daily Continue all other medications as directed  Labwork: BMET due in 2 weeks at [facility]  Testing/Procedures: none  Follow-Up:  Your physician recommends that you schedule a follow-up appointment in: 6 months  Any Other Special Instructions Will Be Listed Below (If Applicable).  If you need a refill on your cardiac medications before your next appointment, please call your pharmacy.    Signed, Sharlene Dory, NP  05/30/2022 4:16 PM    [facility]

## 2022-05-26 NOTE — Patient Instructions (Signed)
Medication Instructions:  Your physician has recommended you make the following change in your medication:  DECREASE losartan to 12.5 mg (1/2 tablet) daily Continue all other medications as directed  Labwork: BMET due in 2 weeks at [place]  Testing/Procedures: none  Follow-Up:  Your physician recommends that you schedule a follow-up appointment in: 6 months  Any Other Special Instructions Will Be Listed Below (If Applicable).  If you need a refill on your cardiac medications before your next appointment, please call your pharmacy.

## 2022-06-01 ENCOUNTER — Telehealth: Payer: Self-pay | Admitting: Family Medicine

## 2022-06-01 NOTE — Telephone Encounter (Signed)
Can this patient be established  in our office no longer seeing the other provider. Patient asked to return call if willing to see her.

## 2022-06-04 ENCOUNTER — Encounter: Payer: Self-pay | Admitting: Family Medicine

## 2022-06-04 ENCOUNTER — Ambulatory Visit (INDEPENDENT_AMBULATORY_CARE_PROVIDER_SITE_OTHER): Payer: Medicare Other | Admitting: Family Medicine

## 2022-06-04 VITALS — BP 126/79 | HR 86 | Ht 62.0 in | Wt 148.0 lb

## 2022-06-04 DIAGNOSIS — M79605 Pain in left leg: Secondary | ICD-10-CM | POA: Diagnosis not present

## 2022-06-04 DIAGNOSIS — I10 Essential (primary) hypertension: Secondary | ICD-10-CM | POA: Diagnosis not present

## 2022-06-04 DIAGNOSIS — Z13220 Encounter for screening for lipoid disorders: Secondary | ICD-10-CM

## 2022-06-04 DIAGNOSIS — Z131 Encounter for screening for diabetes mellitus: Secondary | ICD-10-CM

## 2022-06-04 DIAGNOSIS — G90529 Complex regional pain syndrome I of unspecified lower limb: Secondary | ICD-10-CM | POA: Insufficient documentation

## 2022-06-04 MED ORDER — TIZANIDINE HCL 2 MG PO CAPS: 2.0000 mg | ORAL_CAPSULE | Freq: Three times a day (TID) | ORAL | 1 refills | Status: DC

## 2022-06-04 NOTE — Assessment & Plan Note (Addendum)
Muscular pain vs rule out DVT US VENOUS DOPPLER ordered Zanaflex 2 mg Prn for pain Patient reported slight discomfort on left dorsiflexion Advise patient if any new symptoms of shortness of breath or chest pain visit ED. Patient verbalizes understanding regarding plan of care and all questions answered.

## 2022-06-04 NOTE — Progress Notes (Signed)
Patient Office Visit   Subjective   Patient ID: [name], female    DOB: 23-Oct-1945  Age: 77 y.o. MRN: [id number]  CC:  Chief Complaint  Patient presents with   Hypertension    Patient is here for HTN f/u. Has been checking it at home, with normal range.     HPI [name] 77 year old female, presents to the clinic for left leg pain She  has a past medical history of Arthritis, CAD (coronary artery disease), CHF (congestive heart failure), non-Hodgkin's lymphoma, Hyperlipidemia, Hypertension, and Left ventricular apical thrombus.  Leg Pain  The incident occurred more than 1 week ago. There was no injury mechanism. The pain is present in the left leg. The quality of the pain is described as aching, burning and cramping. The pain is at a severity of 8/10. The pain has been Fluctuating since onset. Associated symptoms include tingling. Pertinent negatives include no inability to bear weight, loss of motion, loss of sensation, muscle weakness or numbness. The symptoms are aggravated by movement. She has tried NSAIDs, acetaminophen, elevation and ice for the symptoms. The treatment provided no relief.    [place] Office Visit from 06/04/2022 in [facility]  PHQ-9 Total Score [place] Office Visit from 06/04/2022 in [facility]  PHQ-9 Total Score 7       Outpatient Encounter Medications as of 06/04/2022  Medication Sig   ALPRAZolam (XANAX) 0.5 MG tablet Take 0.5 mg by mouth 2 (two) times daily as needed.   aspirin EC 81 MG tablet Take 1 tablet (81 mg total) by mouth daily. Swallow whole.   bisacodyl (DULCOLAX) 10 MG suppository Place 1 suppository (10 mg total) rectally daily as needed for moderate constipation.   Calcium Carb-Cholecalciferol (OYSTER SHELL CALCIUM W/D) 500-5 MG-MCG TABS Take by mouth.   clopidogrel (PLAVIX) 75 MG tablet Take 1 tablet (75 mg total) by mouth daily.   cyanocobalamin (,VITAMIN B-12,)  1000 MCG/ML injection Inject 1 mcg into the muscle every 30 (thirty) days.   dapagliflozin propanediol (FARXIGA) 10 MG TABS tablet TAKE ONE TABLET BY MOUTH EVERY DAY   diclofenac Sodium (VOLTAREN) 1 % GEL Apply 2 g topically 4 (four) times daily as needed (pain).   DULoxetine (CYMBALTA) 30 MG capsule Take 30 mg by mouth 3 (three) times daily.   fluocinonide gel (LIDEX) 0.05 %    fluticasone (FLONASE) 50 MCG/ACT nasal spray Place 2 sprays into both nostrils daily as needed for allergies.   furosemide (LASIX) 40 MG tablet Take 1 tablet (40 mg total) by mouth as needed (shortness of breath or weight gain).   gabapentin (NEURONTIN) 800 MG tablet Take 800 mg by mouth 3 (three) times daily.   losartan (COZAAR) 25 MG tablet Take 0.5 tablets (12.5 mg total) by mouth daily.   nitroGLYCERIN (NITROSTAT) 0.4 MG SL tablet Place 1 tablet (0.4 mg total) under the tongue every 5 (five) minutes x 3 doses as needed for chest pain.   oxycodone (ROXICODONE) 30 MG immediate release tablet Take 30 mg by mouth 3 (three) times daily as needed for pain.   pantoprazole (PROTONIX) 40 MG tablet Take 40 mg by mouth daily.   polyethylene glycol powder (GLYCOLAX/MIRALAX) 17 GM/SCOOP powder Take 1 Container by mouth once.   potassium chloride SA (KLOR-CON M) 20 MEQ tablet Take 1 tablet (20 mEq total) by mouth as needed (only on the days that you take  the Lasix).   tizanidine (ZANAFLEX) 2 MG capsule Take 1 capsule (2 mg total) by mouth 3 (three) times daily.   No facility-administered encounter medications on file as of 06/04/2022.    Past Surgical History:  Procedure Laterality Date   CORONARY STENT INTERVENTION N/A 08/08/2021   Procedure: CORONARY STENT INTERVENTION;  Surgeon: Sherren Mocha, MD;  Location: [place] [facility];  Service: Cardiovascular;  Laterality: N/A;   CORONARY ULTRASOUND/IVUS N/A 08/08/2021   Procedure: Intravascular Ultrasound/IVUS;  Surgeon: Sherren Mocha, MD;  Location: [place] [facility];  Service:  Cardiovascular;  Laterality: N/A;   LEFT HEART CATH AND CORONARY ANGIOGRAPHY N/A 08/02/2021   Procedure: LEFT HEART CATH AND CORONARY ANGIOGRAPHY;  Surgeon: Nelva Bush, MD;  Location: [place] [facility];  Service: Cardiovascular;  Laterality: N/A;    Review of Systems  Constitutional:  Negative for chills and fever.  Respiratory:  Negative for shortness of breath.   Cardiovascular:  Negative for chest pain.  Gastrointestinal:  Negative for abdominal pain.  Genitourinary:  Negative for dysuria.  Musculoskeletal:  Positive for myalgias.  Neurological:  Positive for tingling. Negative for dizziness, numbness and headaches.      Objective    BP 126/79   Pulse 86   Ht 5\' 2"  (1.575 m)   Wt 148 lb (67.1 kg)   SpO2 95%   BMI 27.07 kg/m   Physical Exam Vitals reviewed.  Constitutional:      General: She is not in acute distress.    Appearance: Normal appearance. She is not ill-appearing, toxic-appearing or diaphoretic.  Eyes:     General:        Right eye: No discharge.        Left eye: No discharge.     Conjunctiva/sclera: Conjunctivae normal.  Cardiovascular:     Rate and Rhythm: Normal rate and regular rhythm.     Heart sounds: Normal heart sounds.  Pulmonary:     Effort: Pulmonary effort is normal. No respiratory distress.     Breath sounds: Normal breath sounds.  Musculoskeletal:        General: Tenderness present. No swelling. Normal range of motion.     Cervical back: Normal range of motion.     Right lower leg: No swelling, deformity, lacerations or tenderness. No edema.     Left lower leg: Tenderness present. No swelling, deformity or lacerations. No edema.     Right ankle: Normal range of motion. Normal pulse.     Left ankle: Normal range of motion. Normal pulse.  Skin:    General: Skin is warm and dry.  Neurological:     General: No focal deficit present.     Mental Status: She is alert and oriented to person, place, and time. Mental status is at baseline.   Psychiatric:        Mood and Affect: Mood normal.        Behavior: Behavior normal.        Thought Content: Thought content normal.        Judgment: Judgment normal.       Assessment & Plan:  Primary hypertension -     Microalbumin / creatinine urine ratio -     CBC with Differential/Platelet  Screening for lipid disorders -     Lipid panel  Screening for diabetes mellitus  Left leg pain Assessment & Plan: Muscular pain vs rule out DVT US VENOUS DOPPLER ordered Zanaflex 2 mg Prn for pain Patient reported slight discomfort on dorsiflexion  Advise patient if any new symptoms of shortness of breath or chest pain visit ED. Patient verbalizes understanding regarding plan of care and all questions answered.     Other orders -     tiZANidine HCl; Take 1 capsule (2 mg total) by mouth 3 (three) times daily.  Dispense: 30 capsule; Refill: 1    Return in about 4 months (around 10/04/2022) for chronic follow-up.   Renard Hamper Ria Comment, FNP

## 2022-06-04 NOTE — Patient Instructions (Signed)
It was pleasure meeting with you today. Please take medications as prescribed. Follow up with your primary health provider if any health concerns arises. If symptoms worsen please contact your primary care provider and/or visit the emergency department.  

## 2022-06-07 ENCOUNTER — Telehealth: Payer: Self-pay | Admitting: Gastroenterology

## 2022-06-07 NOTE — Telephone Encounter (Signed)
Colonoscopy report reviewed form [facility]. Last Colonoscopy December 2020. -57mm polyp in cecum -77mm polyp in ascending colon -6 mm polyp in the descending colon -cecal polyp sessile serrated -ascending and descending polyp with tubular adenoma fragments.   Will discuss need for repeat colonoscopy with Dr. Marletta Lor. She was age 77 at time of this colonoscopy. Likely will not need repeat unless she develops alarm symptoms or presents with any significant anemia.   Brooke Bonito, MSN, APRN, FNP-BC, AGACNP-BC [facility] Gastroenterology at [facility]

## 2022-06-10 ENCOUNTER — Ambulatory Visit: Payer: Medicare Other | Admitting: Family Medicine

## 2022-06-15 ENCOUNTER — Other Ambulatory Visit (HOSPITAL_COMMUNITY)
Admission: RE | Admit: 2022-06-15 | Discharge: 2022-06-15 | Disposition: A | Discharge status: Home / Self Care | Payer: Medicare Other | Source: Ambulatory Visit | Attending: Nurse Practitioner | Admitting: Nurse Practitioner

## 2022-06-15 ENCOUNTER — Other Ambulatory Visit (HOSPITAL_COMMUNITY)
Admission: RE | Admit: 2022-06-15 | Discharge: 2022-06-15 | Disposition: A | Discharge status: Home / Self Care | Payer: Medicare Other | Source: Ambulatory Visit | Attending: Family Medicine | Admitting: Family Medicine

## 2022-06-15 DIAGNOSIS — Z131 Encounter for screening for diabetes mellitus: Secondary | ICD-10-CM | POA: Insufficient documentation

## 2022-06-15 DIAGNOSIS — Z79899 Other long term (current) drug therapy: Secondary | ICD-10-CM

## 2022-06-15 DIAGNOSIS — I10 Essential (primary) hypertension: Secondary | ICD-10-CM | POA: Insufficient documentation

## 2022-06-15 DIAGNOSIS — Z13220 Encounter for screening for lipoid disorders: Secondary | ICD-10-CM | POA: Insufficient documentation

## 2022-06-15 LAB — HEMOGLOBIN A1C
Hgb A1c MFr Bld: 5.9 % — ABNORMAL HIGH (ref 4.8–5.6)
Mean Plasma Glucose: 122.63 mg/dL

## 2022-06-15 LAB — CBC WITH DIFFERENTIAL/PLATELET
Abs Immature Granulocytes: 0.01 10*3/uL (ref 0.00–0.07)
Basophils Absolute: 0 10*3/uL (ref 0.0–0.1)
Basophils Relative: 1 %
Eosinophils Absolute: 0.3 10*3/uL (ref 0.0–0.5)
Eosinophils Relative: 5 %
HCT: 44.8 % (ref 36.0–46.0)
Hemoglobin: 14.2 g/dL (ref 12.0–15.0)
Immature Granulocytes: 0 %
Lymphocytes Relative: 22 %
Lymphs Abs: 1.1 10*3/uL (ref 0.7–4.0)
MCH: 28.7 pg (ref 26.0–34.0)
MCHC: 31.7 g/dL (ref 30.0–36.0)
MCV: 90.5 fL (ref 80.0–100.0)
Monocytes Absolute: 0.6 10*3/uL (ref 0.1–1.0)
Monocytes Relative: 12 %
Neutro Abs: 2.9 10*3/uL (ref 1.7–7.7)
Neutrophils Relative %: 60 %
Platelets: 197 10*3/uL (ref 150–400)
RBC: 4.95 MIL/uL (ref 3.87–5.11)
RDW: 14.2 % (ref 11.5–15.5)
WBC: 4.9 10*3/uL (ref 4.0–10.5)
nRBC: 0 % (ref 0.0–0.2)

## 2022-06-15 LAB — BASIC METABOLIC PANEL
Anion gap: 6 (ref 5–15)
BUN: 16 mg/dL (ref 8–23)
CO2: 29 mmol/L (ref 22–32)
Calcium: 8.8 mg/dL — ABNORMAL LOW (ref 8.9–10.3)
Chloride: 103 mmol/L (ref 98–111)
Creatinine, Ser: 0.93 mg/dL (ref 0.44–1.00)
GFR, Estimated: >60 mL/min (ref >60)
Glucose, Bld: 95 mg/dL (ref 70–99)
Potassium: 3.9 mmol/L (ref 3.5–5.1)
Sodium: 138 mmol/L (ref 135–145)

## 2022-06-15 LAB — LIPID PANEL
Cholesterol: 178 mg/dL (ref 0–200)
HDL: 59 mg/dL (ref >40)
LDL Cholesterol: 103 mg/dL — ABNORMAL HIGH (ref 0–99)
Total CHOL/HDL Ratio: 3 RATIO
Triglycerides: 82 mg/dL (ref <150)
VLDL: 16 mg/dL (ref 0–40)

## 2022-06-17 LAB — MICROALBUMIN / CREATININE URINE RATIO
Creatinine, Urine: 97.6 mg/dL
Microalb Creat Ratio: 6 mg/g creat (ref 0–29)
Microalb, Ur: 5.7 ug/mL — ABNORMAL HIGH

## 2022-06-22 ENCOUNTER — Encounter: Payer: Self-pay | Admitting: Family Medicine

## 2022-06-22 ENCOUNTER — Ambulatory Visit (INDEPENDENT_AMBULATORY_CARE_PROVIDER_SITE_OTHER): Payer: Medicare Other | Admitting: Family Medicine

## 2022-06-22 VITALS — BP 146/78 | HR 70 | Ht 62.0 in | Wt 153.0 lb

## 2022-06-22 DIAGNOSIS — R911 Solitary pulmonary nodule: Secondary | ICD-10-CM

## 2022-06-22 DIAGNOSIS — R21 Rash and other nonspecific skin eruption: Secondary | ICD-10-CM | POA: Diagnosis not present

## 2022-06-22 DIAGNOSIS — C8205 Follicular lymphoma grade I, lymph nodes of inguinal region and lower limb: Secondary | ICD-10-CM | POA: Diagnosis not present

## 2022-06-22 DIAGNOSIS — L989 Disorder of the skin and subcutaneous tissue, unspecified: Secondary | ICD-10-CM | POA: Diagnosis not present

## 2022-06-22 MED ORDER — TRIAMCINOLONE ACETONIDE 0.5 % EX OINT: 1.0000 | TOPICAL_OINTMENT | Freq: Two times a day (BID) | CUTANEOUS | 0 refills | Status: DC

## 2022-06-22 MED ORDER — DOXYCYCLINE HYCLATE 100 MG PO TABS: 100.0000 mg | ORAL_TABLET | Freq: Two times a day (BID) | ORAL | 0 refills | Status: AC

## 2022-06-22 NOTE — Assessment & Plan Note (Signed)
Doxycyline 100 mg x 5 days, and Kenalog 0.5% PRN Advise patient to apply cold , wet cloth or ice pack to the skin that itches, wear loose-fitting , cotton clothing, use fragrance-free lotions, soaps, and detergents to minimize irritation.

## 2022-06-22 NOTE — Patient Instructions (Addendum)
Great to see you today.  I have refilled the medication(s) we provide.   If labs were collected, we will inform you of lab results once received either by echart message or telephone call.   - echart message- for normal results that have been seen by the patient already.   - telephone call: abnormal results or if patient has not viewed results in their echart.  

## 2022-06-22 NOTE — Progress Notes (Signed)
Patient Office Visit   Subjective   Patient ID: [name], female    DOB: 1945-10-01  Age: 77 y.o. MRN: [id number]  CC:  Chief Complaint  Patient presents with   Hypertension    Seen cardio in March    HPI [name] 77 year old female presents to clinic for bilateral rash on both legs started a few months ago. She  has a past medical history of Arthritis, CAD (coronary artery disease), CHF (congestive heart failure), non-Hodgkin's lymphoma, Hyperlipidemia, Hypertension, and Left ventricular apical thrombus.For the details of today's visit, please refer to assessment and plan.   HPI   Outpatient Encounter Medications as of 06/22/2022  Medication Sig   ALPRAZolam (XANAX) 0.5 MG tablet Take 0.5 mg by mouth 2 (two) times daily as needed.   aspirin EC 81 MG tablet Take 1 tablet (81 mg total) by mouth daily. Swallow whole.   bisacodyl (DULCOLAX) 10 MG suppository Place 1 suppository (10 mg total) rectally daily as needed for moderate constipation.   Calcium Carb-Cholecalciferol (OYSTER SHELL CALCIUM W/D) 500-5 MG-MCG TABS Take by mouth.   clopidogrel (PLAVIX) 75 MG tablet Take 1 tablet (75 mg total) by mouth daily.   cyanocobalamin (,VITAMIN B-12,) 1000 MCG/ML injection Inject 1 mcg into the muscle every 30 (thirty) days.   dapagliflozin propanediol (FARXIGA) 10 MG TABS tablet TAKE ONE TABLET BY MOUTH EVERY DAY   diclofenac Sodium (VOLTAREN) 1 % GEL Apply 2 g topically 4 (four) times daily as needed (pain).   doxycycline (VIBRA-TABS) 100 MG tablet Take 1 tablet (100 mg total) by mouth 2 (two) times daily for 5 days.   DULoxetine (CYMBALTA) 30 MG capsule Take 30 mg by mouth 3 (three) times daily.   fluticasone (FLONASE) 50 MCG/ACT nasal spray Place 2 sprays into both nostrils daily as needed for allergies.   furosemide (LASIX) 40 MG tablet Take 1 tablet (40 mg total) by mouth as needed (shortness of breath or weight gain).   gabapentin (NEURONTIN) 800 MG tablet Take 800 mg by  mouth 3 (three) times daily.   losartan (COZAAR) 25 MG tablet Take 0.5 tablets (12.5 mg total) by mouth daily.   nitroGLYCERIN (NITROSTAT) 0.4 MG SL tablet Place 1 tablet (0.4 mg total) under the tongue every 5 (five) minutes x 3 doses as needed for chest pain.   oxycodone (ROXICODONE) 30 MG immediate release tablet Take 30 mg by mouth 3 (three) times daily as needed for pain.   pantoprazole (PROTONIX) 40 MG tablet Take 40 mg by mouth daily.   polyethylene glycol powder (GLYCOLAX/MIRALAX) 17 GM/SCOOP powder Take 1 Container by mouth once.   potassium chloride SA (KLOR-CON M) 20 MEQ tablet Take 1 tablet (20 mEq total) by mouth as needed (only on the days that you take the Lasix).   tiZANidine (ZANAFLEX) 4 MG tablet Take 4 mg by mouth 3 (three) times daily.   triamcinolone ointment (KENALOG) 0.5 % Apply 1 Application topically 2 (two) times daily.   [DISCONTINUED] fluocinonide gel (LIDEX) 0.05 %    [DISCONTINUED] tizanidine (ZANAFLEX) 2 MG capsule Take 1 capsule (2 mg total) by mouth 3 (three) times daily.   No facility-administered encounter medications on file as of 06/22/2022.    Past Surgical History:  Procedure Laterality Date   CORONARY STENT INTERVENTION N/A 08/08/2021   Procedure: CORONARY STENT INTERVENTION;  Surgeon: Tonny Bollman, MD;  Location: [facility] INVASIVE CV LAB;  Service: Cardiovascular;  Laterality: N/A;   CORONARY ULTRASOUND/IVUS N/A 08/08/2021  Procedure: Intravascular Ultrasound/IVUS;  Surgeon: Tonny Bollman, MD;  Location: [facility] INVASIVE CV LAB;  Service: Cardiovascular;  Laterality: N/A;   LEFT HEART CATH AND CORONARY ANGIOGRAPHY N/A 08/02/2021   Procedure: LEFT HEART CATH AND CORONARY ANGIOGRAPHY;  Surgeon: Yvonne Kendall, MD;  Location: [facility] INVASIVE CV LAB;  Service: Cardiovascular;  Laterality: N/A;    Review of Systems  Constitutional:  Negative for chills and fever.  HENT:  Negative for hearing loss.   Respiratory:  Negative for shortness of breath.   Cardiovascular:   Negative for chest pain.  Genitourinary:  Negative for dysuria.  Skin:  Positive for itching and rash.  Neurological:  Negative for dizziness and headaches.      Objective    BP (!) 146/78   Pulse 70   Ht  (1.575 m)   Wt 153 lb (69.4 kg)   SpO2 95%   BMI 27.98 kg/m   Physical Exam Vitals reviewed.  Constitutional:      General: She is not in acute distress.    Appearance: Normal appearance. She is not ill-appearing, toxic-appearing or diaphoretic.  HENT:     Head: Normocephalic.  Eyes:     General:        Right eye: No discharge.        Left eye: No discharge.     Conjunctiva/sclera: Conjunctivae normal.  Cardiovascular:     Rate and Rhythm: Normal rate.     Pulses: Normal pulses.     Heart sounds: Normal heart sounds.  Pulmonary:     Effort: Pulmonary effort is normal. No respiratory distress.     Breath sounds: Normal breath sounds.  Musculoskeletal:        General: Normal range of motion.     Cervical back: Normal range of motion.  Skin:    General: Skin is warm and dry.     Capillary Refill: Capillary refill takes less than 2 seconds.     Findings: Rash present. Rash is nodular, pustular and urticarial. Rash is not crusting, macular, papular, purpuric, scaling or vesicular.     Comments: Several small red bumps on both legs  Neurological:     General: No focal deficit present.     Mental Status: She is alert and oriented to person, place, and time.     Coordination: Coordination normal.     Gait: Gait normal.  Psychiatric:        Mood and Affect: Mood normal.        Behavior: Behavior normal.       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  Dispense: 30 g; Refill: 0 -     Doxycycline  Hyclate; Take 1 tablet (100 mg total) by mouth 2 (two) times daily for 5 days.  Dispense: 10 tablet; Refill: 0  Rash and nonspecific skin eruption Assessment & Plan: Doxycyline 100 mg x 5 days, and Kenalog 0.5% PRN Advise patient to apply cold , wet cloth or ice pack to the skin that itches, wear loose-fitting , cotton clothing, use fragrance-free lotions, soaps, and detergents to minimize irritation.      Return in about 4 months (around 10/22/2022) for chronic follow-up, routine labs.  Renard Hamper Ria Comment, FNP

## 2022-06-27 DIAGNOSIS — M25562 Pain in left knee: Secondary | ICD-10-CM | POA: Insufficient documentation

## 2022-06-29 ENCOUNTER — Encounter: Payer: Self-pay | Admitting: Family Medicine

## 2022-06-29 ENCOUNTER — Ambulatory Visit (INDEPENDENT_AMBULATORY_CARE_PROVIDER_SITE_OTHER): Payer: Medicare Other | Admitting: Family Medicine

## 2022-06-29 VITALS — BP 122/86 | HR 96 | Ht 62.0 in | Wt 149.0 lb

## 2022-06-29 DIAGNOSIS — F419 Anxiety disorder, unspecified: Secondary | ICD-10-CM | POA: Diagnosis not present

## 2022-06-29 DIAGNOSIS — R911 Solitary pulmonary nodule: Secondary | ICD-10-CM

## 2022-06-29 MED ORDER — ALPRAZOLAM 0.5 MG PO TABS
0.5000 mg | ORAL_TABLET | Freq: Once | ORAL | 0 refills | Status: DC | PRN
Start: 2022-06-29 — End: 2022-07-20

## 2022-06-29 NOTE — Patient Instructions (Addendum)
          Great to see you today.  I have refilled the medication(s) we provide.    - Please schedule medicare annual wellness visit.  - Please take medications as prescribed. - Follow up with your primary health provider if any health concerns arises. - If symptoms worsen please contact your primary care provider and/or visit the emergency department.

## 2022-06-29 NOTE — Progress Notes (Signed)
Patient Office Visit   Subjective   Patient ID: [name], female    DOB: January 13, 1946  Age: 77 y.o. MRN: [id number]  CC:  Chief Complaint  Patient presents with   Follow-up    1 week f/u would like to discuss refill on alprazolam previous pcp was providing this for her.     HPI [name] 77 year old female, presents to the clinic for anxiety. She  has a past medical history of Arthritis, CAD (coronary artery disease), CHF (congestive heart failure) (HCC), non-Hodgkin's lymphoma, Hyperlipidemia, Hypertension, and Left ventricular apical thrombus.   Presents for initial visit. Patient reports anxiety been gradually worsening. Symptoms include decreased concentration, excessive worry, insomnia, muscle tension, nervous/anxious behavior, panic and restlessness. Symptoms occur occasionally. The severity of symptoms is causing significant distress. The symptoms are aggravated by family issues. The patient sleeps 6 hours per night. The quality of sleep is fair. Nighttime awakenings: several.  Risk factors include a major life event and prior traumatic experience. Her past medical history is significant for anxiety/panic attacks and depression. There is no history of suicide attempts. Past treatments include benzodiazephines. The treatment provided significant relief. Compliance with prior treatments has been good.      Outpatient Encounter Medications as of 06/29/2022  Medication Sig   aspirin EC 81 MG tablet Take 1 tablet (81 mg total) by mouth daily. Swallow whole.   bisacodyl (DULCOLAX) 10 MG suppository Place 1 suppository (10 mg total) rectally daily as needed for moderate constipation.   Calcium Carb-Cholecalciferol (OYSTER SHELL CALCIUM W/D) 500-5 MG-MCG TABS Take by mouth.   clopidogrel (PLAVIX) 75 MG tablet Take 1 tablet (75 mg total) by mouth daily.   cyanocobalamin (,VITAMIN B-12,) 1000 MCG/ML injection Inject 1 mcg into the muscle every 30 (thirty) days.   dapagliflozin  propanediol (FARXIGA) 10 MG TABS tablet TAKE ONE TABLET BY MOUTH EVERY DAY   diclofenac Sodium (VOLTAREN) 1 % GEL Apply 2 g topically 4 (four) times daily as needed (pain).   DULoxetine (CYMBALTA) 30 MG capsule Take 30 mg by mouth 3 (three) times daily.   fluticasone (FLONASE) 50 MCG/ACT nasal spray Place 2 sprays into both nostrils daily as needed for allergies.   furosemide (LASIX) 40 MG tablet Take 1 tablet (40 mg total) by mouth as needed (shortness of breath or weight gain).   gabapentin (NEURONTIN) 800 MG tablet Take 800 mg by mouth 3 (three) times daily.   losartan (COZAAR) 25 MG tablet Take 0.5 tablets (12.5 mg total) by mouth daily.   nitroGLYCERIN (NITROSTAT) 0.4 MG SL tablet Place 1 tablet (0.4 mg total) under the tongue every 5 (five) minutes x 3 doses as needed for chest pain.   oxycodone (ROXICODONE) 30 MG immediate release tablet Take 30 mg by mouth 3 (three) times daily as needed for pain.   pantoprazole (PROTONIX) 40 MG tablet Take 40 mg by mouth daily.   polyethylene glycol powder (GLYCOLAX/MIRALAX) 17 GM/SCOOP powder Take 1 Container by mouth once.   potassium chloride SA (KLOR-CON M) 20 MEQ tablet Take 1 tablet (20 mEq total) by mouth as needed (only on the days that you take the Lasix).   tiZANidine (ZANAFLEX) 4 MG tablet Take 4 mg by mouth 3 (three) times daily.   triamcinolone ointment (KENALOG) 0.5 % Apply 1 Application topically 2 (two) times daily.   [DISCONTINUED] ALPRAZolam (XANAX) 0.5 MG tablet Take 0.5 mg by mouth 2 (two) times daily as needed.   ALPRAZolam (XANAX) 0.5 MG tablet  Take 1 tablet (0.5 mg total) by mouth once as needed for up to 1 dose.   No facility-administered encounter medications on file as of 06/29/2022.    Past Surgical History:  Procedure Laterality Date   CORONARY STENT INTERVENTION N/A 08/08/2021   Procedure: CORONARY STENT INTERVENTION;  Surgeon: Tonny Bollman, MD;  Location: [facility] INVASIVE CV LAB;  Service: Cardiovascular;  Laterality: N/A;    CORONARY ULTRASOUND/IVUS N/A 08/08/2021   Procedure: Intravascular Ultrasound/IVUS;  Surgeon: Tonny Bollman, MD;  Location: [facility] INVASIVE CV LAB;  Service: Cardiovascular;  Laterality: N/A;   LEFT HEART CATH AND CORONARY ANGIOGRAPHY N/A 08/02/2021   Procedure: LEFT HEART CATH AND CORONARY ANGIOGRAPHY;  Surgeon: Yvonne Kendall, MD;  Location: [facility] INVASIVE CV LAB;  Service: Cardiovascular;  Laterality: N/A;    Review of Systems  Constitutional:  Negative for chills and fever.  Eyes:  Negative for blurred vision.  Respiratory:  Negative for shortness of breath.   Cardiovascular:  Negative for chest pain.  Gastrointestinal:  Negative for vomiting.  Genitourinary:  Negative for dysuria.  Skin:  Negative for rash.  Neurological:  Negative for dizziness and headaches.  Psychiatric/Behavioral:  The patient is nervous/anxious and has insomnia.       Objective    BP 122/86   Pulse 96   Ht 5\' 2"  (1.575 m)   Wt 149 lb 0.6 oz (67.6 kg)   SpO2 95%   BMI 27.26 kg/m   Physical Exam Vitals reviewed.  Constitutional:      General: She is not in acute distress.    Appearance: Normal appearance. She is not ill-appearing, toxic-appearing or diaphoretic.  HENT:     Head: Normocephalic.  Eyes:     General:        Right eye: No discharge.        Left eye: No discharge.     Conjunctiva/sclera: Conjunctivae normal.  Cardiovascular:     Rate and Rhythm: Normal rate.     Pulses: Normal pulses.     Heart sounds: Normal heart sounds.  Pulmonary:     Effort: Pulmonary effort is normal. No respiratory distress.     Breath sounds: Normal breath sounds.  Abdominal:     General: Bowel sounds are normal.     Palpations: Abdomen is soft.  Musculoskeletal:        General: Normal range of motion.     Cervical back: Normal range of motion.  Skin:    General: Skin is warm and dry.     Capillary Refill: Capillary refill takes less than 2 seconds.  Neurological:     General: No focal deficit present.      Mental Status: She is alert and oriented to person, place, and time.     Coordination: Coordination normal.     Gait: Gait normal.  Psychiatric:        Mood and Affect: Mood normal.        Behavior: Behavior normal.       Assessment & Plan:  Lung nodule  Anxiety Assessment & Plan:    06/29/2022    2:57 PM 06/04/2022    4:00 PM  GAD 7 : Generalized Anxiety Score  Nervous, Anxious, on Edge 1 1  Control/stop worrying 1 1  Worry too much - different things 1 2  Trouble relaxing 1 2  Restless 1 1  Easily annoyed or irritable 0 1  Afraid - awful might happen 1 1  Total GAD 7 Score 6 9  Anxiety Difficulty  Somewhat difficult Somewhat difficult   Refilled Xanax 0.5 mg explained to patient risk of taking this type of medication due to her age. Patient verbalize and agreed to the risk, has been taking xanax for the past 7 years with no events. Discussed about cognitive behavioral therapy focusing on thoughts, belief, and attitudes that affects feelings and behavior, learning about coping skills to deal with certain problems. Maintaining a consistent routine and schedule, Practice stress management and self calming techniques, excersise regularly and spend time outdoors, Do not eat food that are high in fat, added sugar, or salt.   Orders: -     ALPRAZolam; Take 1 tablet (0.5 mg total) by mouth once as needed for up to 1 dose.  Dispense: 15 tablet; Refill: 0    Return for Please schedule medicare annual wellness visit.Tenna Child Del Newman Nip, FNP

## 2022-06-29 NOTE — Assessment & Plan Note (Signed)
    06/29/2022    2:57 PM 06/04/2022    4:00 PM  GAD 7 : Generalized Anxiety Score  Nervous, Anxious, on Edge 1 1  Control/stop worrying 1 1  Worry too much - different things 1 2  Trouble relaxing 1 2  Restless 1 1  Easily annoyed or irritable 0 1  Afraid - awful might happen 1 1  Total GAD 7 Score 6 9  Anxiety Difficulty Somewhat difficult Somewhat difficult   Refilled Xanax 0.5 mg explained to patient risk of taking this type of medication due to her age. Patient verbalize and agreed to the risk, has been taking xanax for the past 7 years with no events. Discussed about cognitive behavioral therapy focusing on thoughts, belief, and attitudes that affects feelings and behavior, learning about coping skills to deal with certain problems. Maintaining a consistent routine and schedule, Practice stress management and self calming techniques, excersise regularly and spend time outdoors, Do not eat food that are high in fat, added sugar, or salt.

## 2022-07-05 NOTE — Progress Notes (Signed)
[facility] [address], [place] [address]   Clinic Day:  07/06/2022  Referring physician: Wylene Men*  Patient Care Team: Del Newman Nip, Tenna Child, FNP as PCP - General (Family Medicine) Wyline Mood Dorothe Pea, MD as PCP - Cardiology (Cardiology) Doreatha Massed, MD as Medical Oncologist (Medical Oncology)   ASSESSMENT & PLAN:   Assessment:  1.  Stage I grade 1 follicular lymphoma: - Diagnosed with follicular lymphoma of the left groin lymph node on 10/27/2013.  Lymph node was incidentally found on Doppler of the lower extremity for swelling. - Status post XRT completed in December 2015 at [facility]. - Recently had right groin mass biopsy which was benign.  2.  Social/family history: - She is currently living with her daughter and is independent of ADLs and IADLs.  She moved back from [place] to [place] in the last 1 year.  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  Quit smoking last week, smoked half pack per day to 3/4 pack/day for 60 years.  She will only smoked 3 to 4 cigarettes/day in the last 12 years. - Sister died of stomach cancer.  Paternal aunt died of breast cancer.  Plan:  1.  Stage I follicular lymphoma: - She does not report any B symptoms. - Physical exam today reveals no palpable adenopathy or splenomegaly. - Labs today reviewed by me shows normal LFTs, LDH and CBC. - Recommend follow-up in 6 months with labs and exam.  Will do CT scan if clinical condition dictates.  Orders Placed This Encounter  Procedures   CBC with Differential    Standing Status:   Future    Number of Occurrences:   1    Standing Expiration Date:   07/06/2023   Comprehensive metabolic panel    Standing Status:   Future    Number of Occurrences:   1    Standing Expiration Date:   07/06/2023   Lactate dehydrogenase    Standing Status:   Future    Number of Occurrences:   1    Standing Expiration Date:    07/06/2023   CBC with Differential    Standing Status:   Future    Standing Expiration Date:   07/06/2023   Comprehensive metabolic panel    Standing Status:   Future    Standing Expiration Date:   07/06/2023   Lactate dehydrogenase    Standing Status:   Future    Standing Expiration Date:   07/06/2023      I,Katie Daubenspeck,acting as a scribe for Doreatha Massed, MD.,have documented all relevant documentation on the behalf of Doreatha Massed, MD,as directed by  Doreatha Massed, MD while in the presence of Doreatha Massed, MD.   I, Doreatha Massed MD, have reviewed the above documentation for accuracy and completeness, and I agree with the above.   Doreatha Massed, MD   5/6/20245:55 PM  CHIEF COMPLAINT/PURPOSE OF CONSULT:   Diagnosis: low grade follicular lymphoma  Prior Therapy: radiation to left inguinal nodes, completed 02/27/14  Current Therapy:  surveillance  HISTORY OF PRESENT ILLNESS:   [name] is a 77 y.o. female presenting to clinic today for evaluation of low grade follicular lymphoma at the request of Dr. Janece Canterbury.  She was initially diagnosed with low grade follicular lymphoma in 09/2013 after presenting with enlarged inguinal lymph nodes. She was referred to Dr. Gypsy Lore in hematology and Dr. Donne Hazel in radiation oncology.  She completed radiation to the left inguinal nodes on 02/27/14. She has been in remission since that time with no evidence of recurrence.  Today, she states that she is doing well overall. Her appetite level is at 100%. Her energy level is at 50%.  PAST MEDICAL HISTORY:   Past Medical History: Past Medical History:  Diagnosis Date   Arthritis    CAD (coronary artery disease)    a. cath in 07/2021 showing severe multivessel CAD --> not felt to be a CABG candidate and limited viable myocardium --> received DESx2 to LAD   CHF (congestive heart failure) (HCC)    a. EF 30-35% by echo in 07/2021, normalized to  60-65% by repeat imaging later that month.   Hx of non-Hodgkin's lymphoma    Hyperlipidemia    Hypertension    Left ventricular apical thrombus    a. noted on cMRI in 07/2021    Surgical History: Past Surgical History:  Procedure Laterality Date   CORONARY STENT INTERVENTION N/A 08/08/2021   Procedure: CORONARY STENT INTERVENTION;  Surgeon: Tonny Bollman, MD;  Location: [facility] INVASIVE CV LAB;  Service: Cardiovascular;  Laterality: N/A;   CORONARY ULTRASOUND/IVUS N/A 08/08/2021   Procedure: Intravascular Ultrasound/IVUS;  Surgeon: Tonny Bollman, MD;  Location: [facility] INVASIVE CV LAB;  Service: Cardiovascular;  Laterality: N/A;   LEFT HEART CATH AND CORONARY ANGIOGRAPHY N/A 08/02/2021   Procedure: LEFT HEART CATH AND CORONARY ANGIOGRAPHY;  Surgeon: Yvonne Kendall, MD;  Location: [facility] INVASIVE CV LAB;  Service: Cardiovascular;  Laterality: N/A;    Social History: Social History   Socioeconomic History   Marital status: Widowed    Spouse name: Not on file   Number of children: Not on file   Years of education: Not on file   Highest education level: Not on file  Occupational History   Not on file  Tobacco Use   Smoking status: Some Days    Types: Cigarettes    Passive exposure: Never   Smokeless tobacco: Never  Vaping Use   Vaping Use: Never used  Substance and Sexual Activity   Alcohol use: Not Currently   Drug use: Never   Sexual activity: Not on file  Other Topics Concern   Not on file  Social History Narrative   Not on file   Social Determinants of Health   Financial Resource Strain: Not on file  Food Insecurity: No Food Insecurity (07/06/2022)   Hunger Vital Sign    Worried About Running Out of Food in the Last Year: Never true    Ran Out of Food in the Last Year: Never true  Transportation Needs: No Transportation Needs (07/06/2022)   PRAPARE - Administrator, Civil Service (Medical): No    Lack of Transportation (Non-Medical): No  Physical Activity: Not on  file  Stress: Not on file  Social Connections: Not on file  Intimate Partner Violence: Not At Risk (07/06/2022)   Humiliation, Afraid, Rape, and Kick questionnaire    Fear of Current or Ex-Partner: No    Emotionally Abused: No    Physically Abused: No    Sexually Abused: No    Family History: Family History  Problem Relation Age of Onset   Aortic aneurysm Mother    Coronary artery disease Brother     Current Medications:  Current Outpatient Medications:    ALPRAZolam (XANAX) 0.5 MG tablet, Take 1 tablet (0.5 mg total) by mouth once as needed for up to 1 dose., Disp: 15 tablet, Rfl: 0  aspirin EC 81 MG tablet, Take 1 tablet (81 mg total) by mouth daily. Swallow whole., Disp: 90 tablet, Rfl: 3   bisacodyl (DULCOLAX) 10 MG suppository, Place 1 suppository (10 mg total) rectally daily as needed for moderate constipation., Disp: 12 suppository, Rfl: 0   Calcium Carb-Cholecalciferol (OYSTER SHELL CALCIUM W/D) 500-5 MG-MCG TABS, Take by mouth., Disp: , Rfl:    clopidogrel (PLAVIX) 75 MG tablet, Take 1 tablet (75 mg total) by mouth daily., Disp: 30 tablet, Rfl: 6   cyanocobalamin (,VITAMIN B-12,) 1000 MCG/ML injection, Inject 1 mcg into the muscle every 30 (thirty) days., Disp: , Rfl:    dapagliflozin propanediol (FARXIGA) 10 MG TABS tablet, TAKE ONE TABLET BY MOUTH EVERY DAY, Disp: 90 tablet, Rfl: 1   diclofenac Sodium (VOLTAREN) 1 % GEL, Apply 2 g topically 4 (four) times daily as needed (pain)., Disp: , Rfl:    DULoxetine (CYMBALTA) 30 MG capsule, Take 30 mg by mouth 3 (three) times daily., Disp: , Rfl:    fluticasone (FLONASE) 50 MCG/ACT nasal spray, Place 2 sprays into both nostrils daily as needed for allergies., Disp: , Rfl:    furosemide (LASIX) 40 MG tablet, Take 1 tablet (40 mg total) by mouth as needed (shortness of breath or weight gain)., Disp: 30 tablet, Rfl: 3   gabapentin (NEURONTIN) 800 MG tablet, Take 800 mg by mouth 3 (three) times daily., Disp: , Rfl:    losartan (COZAAR)  25 MG tablet, Take 0.5 tablets (12.5 mg total) by mouth daily., Disp: 45 tablet, Rfl: 1   nitroGLYCERIN (NITROSTAT) 0.4 MG SL tablet, Place 1 tablet (0.4 mg total) under the tongue every 5 (five) minutes x 3 doses as needed for chest pain., Disp: 25 tablet, Rfl: 12   oxycodone (ROXICODONE) 30 MG immediate release tablet, Take 30 mg by mouth 3 (three) times daily as needed for pain., Disp: , Rfl:    pantoprazole (PROTONIX) 40 MG tablet, Take 40 mg by mouth daily., Disp: , Rfl:    polyethylene glycol powder (GLYCOLAX/MIRALAX) 17 GM/SCOOP powder, Take 1 Container by mouth once., Disp: , Rfl:    potassium chloride SA (KLOR-CON M) 20 MEQ tablet, Take 1 tablet (20 mEq total) by mouth as needed (only on the days that you take the Lasix)., Disp: 30 tablet, Rfl: 3   tiZANidine (ZANAFLEX) 4 MG tablet, Take 4 mg by mouth 3 (three) times daily., Disp: , Rfl:    triamcinolone ointment (KENALOG) 0.5 %, Apply 1 Application topically 2 (two) times daily., Disp: 30 g, Rfl: 0   Allergies: Allergies  Allergen Reactions   Morphine Rash    REVIEW OF SYSTEMS:   Review of Systems  Constitutional:  Negative for chills, fatigue and fever.  HENT:   Negative for lump/mass, mouth sores, nosebleeds, sore throat and trouble swallowing.   Eyes:  Negative for eye problems.  Respiratory:  Negative for cough and shortness of breath.   Cardiovascular:  Positive for leg swelling (Left). Negative for chest pain and palpitations.  Gastrointestinal:  Positive for constipation. Negative for abdominal pain, diarrhea, nausea and vomiting.  Genitourinary:  Negative for bladder incontinence, difficulty urinating, dysuria, frequency, hematuria and nocturia.   Musculoskeletal:  Positive for arthralgias. Negative for back pain, flank pain, myalgias and neck pain.  Skin:  Negative for itching and rash.  Neurological:  Negative for dizziness, headaches and numbness.  Hematological:  Does not bruise/bleed easily.   Psychiatric/Behavioral:  Negative for depression, sleep disturbance and suicidal ideas. The patient is not nervous/anxious.  All other systems reviewed and are negative.    VITALS:   Blood pressure (!) 142/60, pulse 62, temperature 98 F (36.7 C), temperature source Oral, resp. rate 16, height 5\' 2"  (1.575 m), weight 151 lb (68.5 kg), SpO2 100 %.  Wt Readings from Last 3 Encounters:  07/06/22 151 lb (68.5 kg)  06/29/22 149 lb 0.6 oz (67.6 kg)  06/22/22 153 lb (69.4 kg)    Body mass index is 27.62 kg/m.   PHYSICAL EXAM:   Physical Exam Vitals and nursing note reviewed. Exam conducted with a chaperone present.  Constitutional:      Appearance: Normal appearance.  Cardiovascular:     Rate and Rhythm: Normal rate and regular rhythm.     Pulses: Normal pulses.     Heart sounds: Normal heart sounds.  Pulmonary:     Effort: Pulmonary effort is normal.     Breath sounds: Normal breath sounds.  Abdominal:     Palpations: Abdomen is soft. There is no hepatomegaly, splenomegaly or mass.     Tenderness: There is no abdominal tenderness.  Musculoskeletal:     Right lower leg: No edema.     Left lower leg: No edema.  Lymphadenopathy:     Cervical: No cervical adenopathy.     Right cervical: No superficial, deep or posterior cervical adenopathy.    Left cervical: No superficial, deep or posterior cervical adenopathy.     Upper Body:     Right upper body: No supraclavicular or axillary adenopathy.     Left upper body: No supraclavicular or axillary adenopathy.  Neurological:     General: No focal deficit present.     Mental Status: She is alert and oriented to person, place, and time.  Psychiatric:        Mood and Affect: Mood normal.        Behavior: Behavior normal.     LABS:      Latest Ref Rng & Units 07/06/2022    2:09 PM 06/15/2022    3:16 PM 03/11/2022    4:24 PM  CBC  WBC 4.0 - 10.5 K/uL 5.4  4.9  6.3   Hemoglobin 12.0 - 15.0 g/dL 13.0  86.5  78.4   Hematocrit  36.0 - 46.0 % 43.1  44.8  40.4   Platelets 150 - 400 K/uL 177  197  252       Latest Ref Rng & Units 07/06/2022    2:09 PM 06/15/2022    3:17 PM 05/18/2022   12:33 PM  CMP  Glucose 70 - 99 mg/dL 89  95  696   BUN 8 - 23 mg/dL 15  16  20    Creatinine 0.44 - 1.00 mg/dL 2.95  2.84  1.32   Sodium 135 - 145 mmol/L 139  138  137   Potassium 3.5 - 5.1 mmol/L 3.6  3.9  4.1   Chloride 98 - 111 mmol/L 100  103  103   CO2 22 - 32 mmol/L 30  29  26    Calcium 8.9 - 10.3 mg/dL 9.1  8.8  9.2   Total Protein 6.5 - 8.1 g/dL 6.8   7.2   Total Bilirubin 0.3 - 1.2 mg/dL 0.4   0.6   Alkaline Phos 38 - 126 U/L 108   76   AST 15 - 41 U/L 38   31   ALT 0 - 44 U/L 40   21      No results found for: "CEA1", "CEA" / No  results found for: "CEA1", "CEA" No results found for: "PSA1" No results found for: "ZOX096" No results found for: "CAN125"  No results found for: "TOTALPROTELP", "ALBUMINELP", "A1GS", "A2GS", "BETS", "BETA2SER", "GAMS", "MSPIKE", "SPEI" Lab Results  Component Value Date   TIBC 273 08/26/2021   FERRITIN 29 08/26/2021   IRONPCTSAT 24 08/26/2021   Lab Results  Component Value Date   LDH 150 07/06/2022     STUDIES:   No results found.

## 2022-07-06 ENCOUNTER — Encounter: Payer: Self-pay | Admitting: Hematology

## 2022-07-06 ENCOUNTER — Inpatient Hospital Stay: Payer: Medicare Other

## 2022-07-06 ENCOUNTER — Inpatient Hospital Stay: Payer: Medicare Other | Attending: Hematology | Admitting: Hematology

## 2022-07-06 VITALS — BP 142/60 | HR 62 | Temp 98.0°F | Resp 16 | Ht 62.0 in | Wt 151.0 lb

## 2022-07-06 DIAGNOSIS — C8205 Follicular lymphoma grade I, lymph nodes of inguinal region and lower limb: Secondary | ICD-10-CM | POA: Diagnosis not present

## 2022-07-06 DIAGNOSIS — Z87891 Personal history of nicotine dependence: Secondary | ICD-10-CM | POA: Diagnosis not present

## 2022-07-06 DIAGNOSIS — Z803 Family history of malignant neoplasm of breast: Secondary | ICD-10-CM | POA: Insufficient documentation

## 2022-07-06 DIAGNOSIS — Z923 Personal history of irradiation: Secondary | ICD-10-CM | POA: Insufficient documentation

## 2022-07-06 LAB — CBC WITH DIFFERENTIAL/PLATELET
Abs Immature Granulocytes: 0.02 10*3/uL (ref 0.00–0.07)
Basophils Absolute: 0 10*3/uL (ref 0.0–0.1)
Basophils Relative: 1 %
Eosinophils Absolute: 0.3 10*3/uL (ref 0.0–0.5)
Eosinophils Relative: 5 %
HCT: 43.1 % (ref 36.0–46.0)
Hemoglobin: 13.6 g/dL (ref 12.0–15.0)
Immature Granulocytes: 0 %
Lymphocytes Relative: 27 %
Lymphs Abs: 1.5 10*3/uL (ref 0.7–4.0)
MCH: 28.7 pg (ref 26.0–34.0)
MCHC: 31.6 g/dL (ref 30.0–36.0)
MCV: 90.9 fL (ref 80.0–100.0)
Monocytes Absolute: 0.7 10*3/uL (ref 0.1–1.0)
Monocytes Relative: 13 %
Neutro Abs: 2.9 10*3/uL (ref 1.7–7.7)
Neutrophils Relative %: 54 %
Platelets: 177 10*3/uL (ref 150–400)
RBC: 4.74 MIL/uL (ref 3.87–5.11)
RDW: 14.7 % (ref 11.5–15.5)
WBC: 5.4 10*3/uL (ref 4.0–10.5)
nRBC: 0 % (ref 0.0–0.2)

## 2022-07-06 LAB — COMPREHENSIVE METABOLIC PANEL
ALT: 40 U/L (ref 0–44)
AST: 38 U/L (ref 15–41)
Albumin: 3.8 g/dL (ref 3.5–5.0)
Alkaline Phosphatase: 108 U/L (ref 38–126)
Anion gap: 9 (ref 5–15)
BUN: 15 mg/dL (ref 8–23)
CO2: 30 mmol/L (ref 22–32)
Calcium: 9.1 mg/dL (ref 8.9–10.3)
Chloride: 100 mmol/L (ref 98–111)
Creatinine, Ser: 0.96 mg/dL (ref 0.44–1.00)
GFR, Estimated: >60 mL/min (ref >60)
Glucose, Bld: 89 mg/dL (ref 70–99)
Potassium: 3.6 mmol/L (ref 3.5–5.1)
Sodium: 139 mmol/L (ref 135–145)
Total Bilirubin: 0.4 mg/dL (ref 0.3–1.2)
Total Protein: 6.8 g/dL (ref 6.5–8.1)

## 2022-07-06 LAB — LACTATE DEHYDROGENASE: LDH: 150 U/L (ref 98–192)

## 2022-07-06 NOTE — Patient Instructions (Addendum)
[facility]  Discharge Instructions  You were seen and examined today by Dr. Ellin Saba. Dr. Ellin Saba is a medical oncologist, meaning that he specializes in the treatment of cancer diagnoses. Dr. Ellin Saba discussed your past medical history, family history of cancers, and the events that led to you being here today.  You were referred to Dr. Ellin Saba for ongoing management of your previous lymphoma. Dr. Ellin Saba has recommended routine lab work today and again in 6 months.  Follow-up as scheduled.  Thank you for choosing [facility] to provide your oncology and hematology care.   To afford each patient quality time with our provider, please arrive at least 15 minutes before your scheduled appointment time. You may need to reschedule your appointment if you arrive late (10 or more minutes). Arriving late affects you and other patients whose appointments are after yours.  Also, if you miss three or more appointments without notifying the office, you may be dismissed from the clinic at the provider's discretion.    Again, thank you for choosing [facility].  Our hope is that these requests will decrease the amount of time that you wait before being seen by our physicians.   If you have a lab appointment with the Cancer Center - please note that after April 8th, all labs will be drawn in the cancer center.  You do not have to check in or register with the main entrance as you have in the past but will complete your check-in at the cancer center.            _____________________________________________________________  Should you have questions after your visit to [facility], please contact our office at [phone] and follow the prompts.  Our office hours are 8:00 a.m. to 4:30 p.m. Monday - Thursday and 8:00 a.m. to 2:30 p.m. Friday.  Please note that voicemails left after 4:00 p.m. may not be returned until  the following business day.  We are closed weekends and all major holidays.  You do have access to a nurse 24-7, just call the main number to the clinic [phone] and do not press any options, hold on the line and a nurse will answer the phone.    For prescription refill requests, have your pharmacy contact our office and allow 72 hours.    Masks are no longer required in the cancer centers. If you would like for your care team to wear a mask while they are taking care of you, please let them know. You may have one support person who is at least 78 years old accompany you for your appointments.

## 2022-07-07 ENCOUNTER — Telehealth: Payer: Self-pay | Admitting: *Deleted

## 2022-07-07 NOTE — Telephone Encounter (Signed)
Message left for patient to advise of normal blood work per Dr. Marice Potter request.

## 2022-07-08 ENCOUNTER — Ambulatory Visit: Payer: Medicare Other | Admitting: Pulmonary Disease

## 2022-07-08 ENCOUNTER — Encounter: Payer: Self-pay | Admitting: Pulmonary Disease

## 2022-07-08 ENCOUNTER — Institutional Professional Consult (permissible substitution): Payer: Medicare Other | Admitting: Pulmonary Disease

## 2022-07-08 VITALS — BP 110/80 | HR 71 | Ht 62.0 in | Wt 154.4 lb

## 2022-07-08 DIAGNOSIS — R911 Solitary pulmonary nodule: Secondary | ICD-10-CM | POA: Diagnosis not present

## 2022-07-08 DIAGNOSIS — I5022 Chronic systolic (congestive) heart failure: Secondary | ICD-10-CM | POA: Diagnosis not present

## 2022-07-08 DIAGNOSIS — Z720 Tobacco use: Secondary | ICD-10-CM | POA: Diagnosis not present

## 2022-07-08 NOTE — Progress Notes (Signed)
Synopsis: Referred in May 2024 for pulmonary nodule by Jennette Banker, Marianna Fuss*  Subjective:   PATIENT ID: [name] GENDER: female DOB: 1945-04-26, MRN: [id number]  Chief Complaint  Patient presents with   Consult    Lung nodule.    This is a 77 year old female, past medical history of coronary artery disease non-Hodgkin's lymphoma, hyperlipidemia, hypertension, longstanding history of tobacco use.  Patient had CT imaging of the chest that was complete during evaluation at [facility].  This showed a left upper lobe lesion groundglass subsolid in July 2023.  She has not had CT imaging follow-up since then.  She does have history of vascular disease on Plavix.  No respiratory complaints today.  She still is smoking and vaping.  She has smoked for 40+ years.  No previous history of malignancy, no family history of lung cancer.    Past Medical History:  Diagnosis Date   Arthritis    CAD (coronary artery disease)    a. cath in 07/2021 showing severe multivessel CAD --> not felt to be a CABG candidate and limited viable myocardium --> received DESx2 to LAD   CHF (congestive heart failure) (HCC)    a. EF 30-35% by echo in 07/2021, normalized to 60-65% by repeat imaging later that month.   Hx of non-Hodgkin's lymphoma    Hyperlipidemia    Hypertension    Left ventricular apical thrombus    a. noted on cMRI in 07/2021     Family History  Problem Relation Age of Onset   Aortic aneurysm Mother    Coronary artery disease Brother      Past Surgical History:  Procedure Laterality Date   CORONARY STENT INTERVENTION N/A 08/08/2021   Procedure: CORONARY STENT INTERVENTION;  Surgeon: Tonny Bollman, MD;  Location: [facility] INVASIVE CV LAB;  Service: Cardiovascular;  Laterality: N/A;   CORONARY ULTRASOUND/IVUS N/A 08/08/2021   Procedure: Intravascular Ultrasound/IVUS;  Surgeon: Tonny Bollman, MD;  Location: [facility] INVASIVE CV LAB;  Service: Cardiovascular;  Laterality: N/A;   LEFT HEART CATH AND  CORONARY ANGIOGRAPHY N/A 08/02/2021   Procedure: LEFT HEART CATH AND CORONARY ANGIOGRAPHY;  Surgeon: Yvonne Kendall, MD;  Location: [facility] INVASIVE CV LAB;  Service: Cardiovascular;  Laterality: N/A;    Social History   Socioeconomic History   Marital status: Widowed    Spouse name: Not on file   Number of children: Not on file   Years of education: Not on file   Highest education level: Not on file  Occupational History   Not on file  Tobacco Use   Smoking status: Former    Types: Cigarettes    Quit date: 07/03/2022    Years since quitting: 0.0    Passive exposure: Never   Smokeless tobacco: Never   Tobacco comments:    Quit 07/03/2022 07/08/22 [place]  Vaping Use   Vaping Use: Never used  Substance and Sexual Activity   Alcohol use: Not Currently   Drug use: Never   Sexual activity: Not on file  Other Topics Concern   Not on file  Social History Narrative   Not on file   Social Determinants of Health   Financial Resource Strain: Not on file  Food Insecurity: No Food Insecurity (07/06/2022)   Hunger Vital Sign    Worried About Running Out of Food in the Last Year: Never true    Ran Out of Food in the Last Year: Never true  Transportation Needs: No Transportation Needs (07/06/2022)   PRAPARE - Transportation  Lack of Transportation (Medical): No    Lack of Transportation (Non-Medical): No  Physical Activity: Not on file  Stress: Not on file  Social Connections: Not on file  Intimate Partner Violence: Not At Risk (07/06/2022)   Humiliation, Afraid, Rape, and Kick questionnaire    Fear of Current or Ex-Partner: No    Emotionally Abused: No    Physically Abused: No    Sexually Abused: No     Allergies  Allergen Reactions   Morphine Rash     Outpatient Medications Prior to Visit  Medication Sig Dispense Refill   ALPRAZolam (XANAX) 0.5 MG tablet Take 1 tablet (0.5 mg total) by mouth once as needed for up to 1 dose. 15 tablet 0   aspirin EC 81 MG tablet Take 1 tablet (81 mg  total) by mouth daily. Swallow whole. 90 tablet 3   bisacodyl (DULCOLAX) 10 MG suppository Place 1 suppository (10 mg total) rectally daily as needed for moderate constipation. 12 suppository 0   Calcium Carb-Cholecalciferol (OYSTER SHELL CALCIUM W/D) 500-5 MG-MCG TABS Take by mouth.     clopidogrel (PLAVIX) 75 MG tablet Take 1 tablet (75 mg total) by mouth daily. 30 tablet 6   cyanocobalamin (,VITAMIN B-12,) 1000 MCG/ML injection Inject 1 mcg into the muscle every 30 (thirty) days.     dapagliflozin propanediol (FARXIGA) 10 MG TABS tablet TAKE ONE TABLET BY MOUTH EVERY DAY 90 tablet 1   diclofenac Sodium (VOLTAREN) 1 % GEL Apply 2 g topically 4 (four) times daily as needed (pain).     DULoxetine (CYMBALTA) 30 MG capsule Take 30 mg by mouth 3 (three) times daily.     fluticasone (FLONASE) 50 MCG/ACT nasal spray Place 2 sprays into both nostrils daily as needed for allergies.     furosemide (LASIX) 40 MG tablet Take 1 tablet (40 mg total) by mouth as needed (shortness of breath or weight gain). 30 tablet 3   gabapentin (NEURONTIN) 800 MG tablet Take 800 mg by mouth 3 (three) times daily.     losartan (COZAAR) 25 MG tablet Take 0.5 tablets (12.5 mg total) by mouth daily. 45 tablet 1   nitroGLYCERIN (NITROSTAT) 0.4 MG SL tablet Place 1 tablet (0.4 mg total) under the tongue every 5 (five) minutes x 3 doses as needed for chest pain. 25 tablet 12   oxycodone (ROXICODONE) 30 MG immediate release tablet Take 30 mg by mouth 3 (three) times daily as needed for pain.     pantoprazole (PROTONIX) 40 MG tablet Take 40 mg by mouth daily.     polyethylene glycol powder (GLYCOLAX/MIRALAX) 17 GM/SCOOP powder Take 1 Container by mouth once.     potassium chloride SA (KLOR-CON M) 20 MEQ tablet Take 1 tablet (20 mEq total) by mouth as needed (only on the days that you take the Lasix). 30 tablet 3   tiZANidine (ZANAFLEX) 4 MG tablet Take 4 mg by mouth 3 (three) times daily.     triamcinolone ointment (KENALOG) 0.5 %  Apply 1 Application topically 2 (two) times daily. 30 g 0   No facility-administered medications prior to visit.    Review of Systems  Constitutional:  Negative for chills, fever, malaise/fatigue and weight loss.  HENT:  Negative for hearing loss, sore throat and tinnitus.   Eyes:  Negative for blurred vision and double vision.  Respiratory:  Positive for cough and shortness of breath. Negative for hemoptysis, sputum production, wheezing and stridor.   Cardiovascular:  Negative for chest pain, palpitations, orthopnea, leg  swelling and PND.  Gastrointestinal:  Negative for abdominal pain, constipation, diarrhea, heartburn, nausea and vomiting.  Genitourinary:  Negative for dysuria, hematuria and urgency.  Musculoskeletal:  Negative for joint pain and myalgias.  Skin:  Negative for itching and rash.  Neurological:  Negative for dizziness, tingling, weakness and headaches.  Endo/Heme/Allergies:  Negative for environmental allergies. Does not bruise/bleed easily.  Psychiatric/Behavioral:  Negative for depression. The patient is not nervous/anxious and does not have insomnia.   All other systems reviewed and are negative.    Objective:  Physical Exam Vitals reviewed.  Constitutional:      General: She is not in acute distress.    Appearance: She is well-developed.  HENT:     Head: Normocephalic and atraumatic.  Eyes:     General: No scleral icterus.    Conjunctiva/sclera: Conjunctivae normal.     Pupils: Pupils are equal, round, and reactive to light.  Neck:     Vascular: No JVD.     Trachea: No tracheal deviation.  Cardiovascular:     Rate and Rhythm: Normal rate and regular rhythm.     Heart sounds: Normal heart sounds. No murmur heard. Pulmonary:     Effort: Pulmonary effort is normal. No tachypnea, accessory muscle usage or respiratory distress.     Breath sounds: No stridor. No wheezing, rhonchi or rales.     Comments: Diminished breath sounds bilaterally Abdominal:      General: There is no distension.     Palpations: Abdomen is soft.     Tenderness: There is no abdominal tenderness.  Musculoskeletal:        General: No tenderness.     Cervical back: Neck supple.  Lymphadenopathy:     Cervical: No cervical adenopathy.  Skin:    General: Skin is warm and dry.     Capillary Refill: Capillary refill takes less than 2 seconds.     Findings: No rash.  Neurological:     Mental Status: She is alert and oriented to person, place, and time.  Psychiatric:        Behavior: Behavior normal.      Vitals:   07/08/22 1536  BP: 110/80  Pulse: 71  SpO2: 92%  Weight: 154 lb 6.4 oz (70 kg)  Height: 5\' 2"  (1.575 m)   92% on RA BMI Readings from Last 3 Encounters:  07/08/22 28.24 kg/m  07/06/22 27.62 kg/m  06/29/22 27.26 kg/m   Wt Readings from Last 3 Encounters:  07/08/22 154 lb 6.4 oz (70 kg)  07/06/22 151 lb (68.5 kg)  06/29/22 149 lb 0.6 oz (67.6 kg)     CBC    Component Value Date/Time   WBC 5.4 07/06/2022 1409   RBC 4.74 07/06/2022 1409   HGB 13.6 07/06/2022 1409   HGB 12.9 03/11/2022 1624   HCT 43.1 07/06/2022 1409   HCT 40.4 03/11/2022 1624   PLT 177 07/06/2022 1409   PLT 252 03/11/2022 1624   MCV 90.9 07/06/2022 1409   MCV 88 03/11/2022 1624   MCH 28.7 07/06/2022 1409   MCHC 31.6 07/06/2022 1409   RDW 14.7 07/06/2022 1409   RDW 16.0 (H) 03/11/2022 1624   LYMPHSABS 1.5 07/06/2022 1409   LYMPHSABS 1.2 03/11/2022 1624   MONOABS 0.7 07/06/2022 1409   EOSABS 0.3 07/06/2022 1409   EOSABS 0.3 03/11/2022 1624   BASOSABS 0.0 07/06/2022 1409   BASOSABS 0.1 03/11/2022 1624     Chest Imaging:  CT chest July 2023: Left upper lobe subsolid groundglass  lesion. Evidence of emphysema. The patient's images have been independently reviewed by me.    Pulmonary Functions Testing Results:     No data to display          FeNO:   Pathology:   Echocardiogram:   Heart Catheterization:     Assessment & Plan:     ICD-10-CM    1. Lung nodule  R91.1 CT Super D Chest Wo Contrast    Pulmonary Function Test    2. Chronic systolic CHF (congestive heart failure) (HCC)  I50.22     3. Tobacco use  Z72.0       Discussion:  This is a 77 year old female longstanding history of tobacco use, chronic systolic heart failure.  Patient has CT evidence of radiographic emphysema.  Plan: Today in the office we talked out the risk of malignancy and upper lobe predominant lesions and smokers. She does have a subsolid lesion.  If it is persistent I do think it is high risk for being malignant. She will need short-term CT follow-up.  She has not had her CT follow-up as recommended from her previous scan.  Will try to get this next available as well as pulmonary function test. Based on the PFTs and CT results we will discuss next steps. But likely be able to move forward with consideration for bronchoscopy and biopsy if lesion is still persistent. Based on the subsolid lesion nature I am not sure that pet imaging will help [place] differentiate malignancy or not in this setting. However we will approach that discussion with patient in the future. RTC in 6 to 8 weeks after CT and PFTs are complete. Can see me or SG, NP in follow-up.   Current Outpatient Medications:    ALPRAZolam (XANAX) 0.5 MG tablet, Take 1 tablet (0.5 mg total) by mouth once as needed for up to 1 dose., Disp: 15 tablet, Rfl: 0   aspirin EC 81 MG tablet, Take 1 tablet (81 mg total) by mouth daily. Swallow whole., Disp: 90 tablet, Rfl: 3   bisacodyl (DULCOLAX) 10 MG suppository, Place 1 suppository (10 mg total) rectally daily as needed for moderate constipation., Disp: 12 suppository, Rfl: 0   Calcium Carb-Cholecalciferol (OYSTER SHELL CALCIUM W/D) 500-5 MG-MCG TABS, Take by mouth., Disp: , Rfl:    clopidogrel (PLAVIX) 75 MG tablet, Take 1 tablet (75 mg total) by mouth daily., Disp: 30 tablet, Rfl: 6   cyanocobalamin (,VITAMIN B-12,) 1000 MCG/ML injection, Inject 1  mcg into the muscle every 30 (thirty) days., Disp: , Rfl:    dapagliflozin propanediol (FARXIGA) 10 MG TABS tablet, TAKE ONE TABLET BY MOUTH EVERY DAY, Disp: 90 tablet, Rfl: 1   diclofenac Sodium (VOLTAREN) 1 % GEL, Apply 2 g topically 4 (four) times daily as needed (pain)., Disp: , Rfl:    DULoxetine (CYMBALTA) 30 MG capsule, Take 30 mg by mouth 3 (three) times daily., Disp: , Rfl:    fluticasone (FLONASE) 50 MCG/ACT nasal spray, Place 2 sprays into both nostrils daily as needed for allergies., Disp: , Rfl:    furosemide (LASIX) 40 MG tablet, Take 1 tablet (40 mg total) by mouth as needed (shortness of breath or weight gain)., Disp: 30 tablet, Rfl: 3   gabapentin (NEURONTIN) 800 MG tablet, Take 800 mg by mouth 3 (three) times daily., Disp: , Rfl:    losartan (COZAAR) 25 MG tablet, Take 0.5 tablets (12.5 mg total) by mouth daily., Disp: 45 tablet, Rfl: 1   nitroGLYCERIN (NITROSTAT) 0.4 MG SL tablet, Place  1 tablet (0.4 mg total) under the tongue every 5 (five) minutes x 3 doses as needed for chest pain., Disp: 25 tablet, Rfl: 12   oxycodone (ROXICODONE) 30 MG immediate release tablet, Take 30 mg by mouth 3 (three) times daily as needed for pain., Disp: , Rfl:    pantoprazole (PROTONIX) 40 MG tablet, Take 40 mg by mouth daily., Disp: , Rfl:    polyethylene glycol powder (GLYCOLAX/MIRALAX) 17 GM/SCOOP powder, Take 1 Container by mouth once., Disp: , Rfl:    potassium chloride SA (KLOR-CON M) 20 MEQ tablet, Take 1 tablet (20 mEq total) by mouth as needed (only on the days that you take the Lasix)., Disp: 30 tablet, Rfl: 3   tiZANidine (ZANAFLEX) 4 MG tablet, Take 4 mg by mouth 3 (three) times daily., Disp: , Rfl:    triamcinolone ointment (KENALOG) 0.5 %, Apply 1 Application topically 2 (two) times daily., Disp: 30 g, Rfl: 0   Josephine Igo, DO [facility] 07/08/2022 4:59 PM

## 2022-07-08 NOTE — Patient Instructions (Addendum)
Thank you for visiting Dr. Tonia Brooms at [facility]. Today we recommend the following:  Orders Placed This Encounter  Procedures   CT Super D Chest Wo Contrast   Pulmonary Function Test   Return in about 6 weeks (around 08/19/2022) for with Kandice Robinsons, NP, after CT Chest, after PFTs.    Please do your part to reduce the spread of COVID-19.

## 2022-07-17 ENCOUNTER — Telehealth: Payer: Self-pay | Admitting: Cardiology

## 2022-07-17 DIAGNOSIS — E785 Hyperlipidemia, unspecified: Secondary | ICD-10-CM

## 2022-07-17 DIAGNOSIS — Z79899 Other long term (current) drug therapy: Secondary | ICD-10-CM

## 2022-07-17 NOTE — Telephone Encounter (Signed)
Pt c/o medication issue:  1. Name of Medication: atorvastatin (LIPITOR) 80 MG tablet   2. How are you currently taking this medication (dosage and times per day)? 1/2 a tablet daily  3. Are you having a reaction (difficulty breathing--STAT)? No   4. What is your medication issue? Has been taking a 1/2 a tablet since being advised to stop due to being scared to stop since she was on simvastatin for years prior to her heart attack.   She is requesting a 40 mg script be sent to [facility].  Please advise.

## 2022-07-17 NOTE — Telephone Encounter (Unsigned)
Reports she is concerned that she is no longer taking a statin drug. Will forward to provider for other treatment options.

## 2022-07-18 IMAGING — MR MR CARD MORPHOLOGY WO/W CM
45 of 48 series · 45 of 48 positions shown · IV contrast (Gadavist)
Comparison: none

Addendum:
CLINICAL DATA: Clinical question of cardiac viability
Study assumes HCT of 42 and BSA of

EXAM:
CARDIAC MRI
TECHNIQUE: The patient was scanned on a 1.5 Tesla GE magnet. A dedicated
cardiac coil was used. Functional imaging was done using Fiesta
sequences. [DATE], and 4 chamber views were done to assess for RWMA's.
Modified Klpigbb rule using a short axis stack was used to
calculate an ejection fraction on a dedicated work station using
Circle software. The patient received 10 cc of Gadavist. After 10
minutes inversion recovery sequences were used to assess for
infiltration and scar tissue.
CONTRAST:  10 cc  of Gadavist

[Series 4: [id number]_bh · axial · 8.0mm · 1.41mm/px · 1 of 16 slices shown]
[im 1/16]
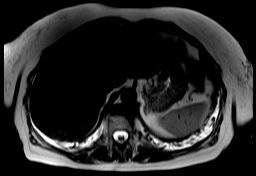

[Series 8: bSSFP · oblique · 8.0mm · 1.61mm/px · 1 of 25 slices shown (1 of 19)]
[im 1/25]
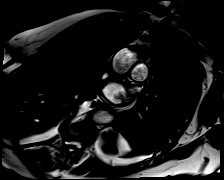

[Series 9: bSSFP · oblique · 8.0mm · 1.61mm/px · 1 of 25 slices shown (2 of 19)]
[im 1/25]
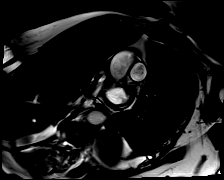

[Series 10: bSSFP · oblique · 8.0mm · 1.61mm/px · 1 of 25 slices shown (3 of 19)]
[im 1/25]
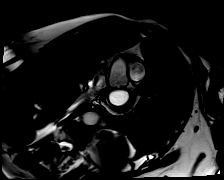

[Series 11: bSSFP · oblique · 8.0mm · 1.61mm/px · 1 of 25 slices shown (4 of 19)]
[im 1/25]
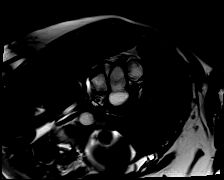

[Series 12: bSSFP · oblique · 8.0mm · 1.61mm/px · 1 of 25 slices shown (5 of 19)]
[im 1/25]
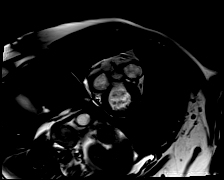

[Series 13: bSSFP · oblique · 8.0mm · 1.61mm/px · 1 of 25 slices shown (6 of 19)]
[im 1/25]
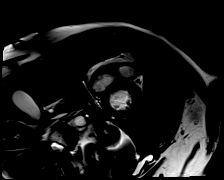

[Series 14: bSSFP · oblique · 8.0mm · 1.61mm/px · 1 of 25 slices shown (7 of 19)]
[im 1/25]
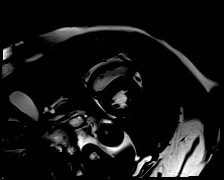

[Series 15: bSSFP · oblique · 8.0mm · 1.61mm/px · 1 of 25 slices shown (8 of 19)]
[im 1/25]
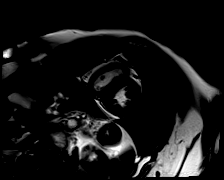

[Series 16: bSSFP · oblique · 8.0mm · 1.61mm/px · 1 of 25 slices shown (9 of 19)]
[im 1/25]
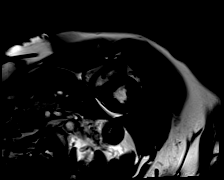

[Series 17: bSSFP · oblique · 8.0mm · 1.61mm/px · 1 of 25 slices shown (10 of 19)]
[im 1/25]
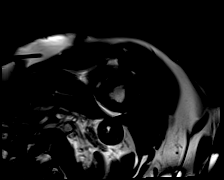

[Series 18: bSSFP · oblique · 8.0mm · 1.61mm/px · 1 of 25 slices shown (11 of 19)]
[im 1/25]
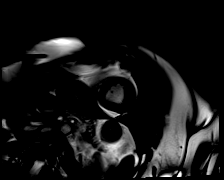

[Series 19: bSSFP · oblique · 8.0mm · 1.61mm/px · 1 of 25 slices shown (12 of 19)]
[im 1/25]
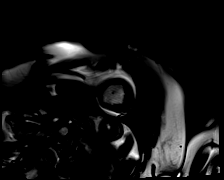

[Series 20: bSSFP · oblique · 8.0mm · 1.61mm/px · 1 of 25 slices shown (13 of 19)]
[im 1/25]
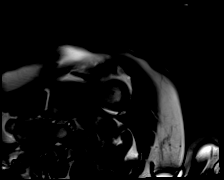

[Series 21: bSSFP · oblique · 8.0mm · 1.61mm/px · 1 of 25 slices shown (14 of 19)]
[im 1/25]
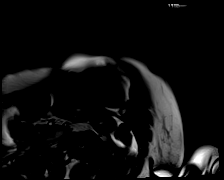

[Series 22: bSSFP · oblique · 8.0mm · 1.61mm/px · 1 of 25 slices shown (15 of 19)]
[im 1/25]
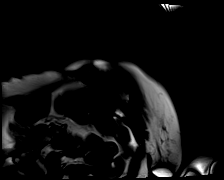

[Series 23: bSSFP · oblique · 8.0mm · 1.61mm/px · 1 of 25 slices shown (16 of 19)]
[im 1/25]
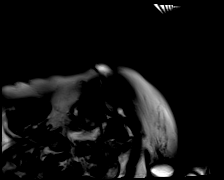

[Series 24: bSSFP · oblique · 6.0mm · 1.41mm/px · 1 of 25 slices shown (17 of 19)]
[im 1/25]
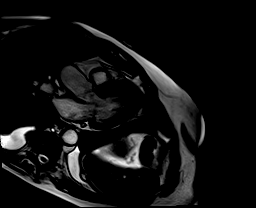

[Series 25: bSSFP · oblique · 6.0mm · 1.48mm/px · 1 of 25 slices shown (18 of 19)]
[im 1/25]
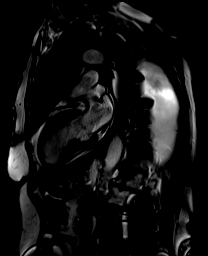

[Series 26: bSSFP · axial · 6.0mm · 1.41mm/px · 1 of 25 slices shown (19 of 19)]
[im 1/25]
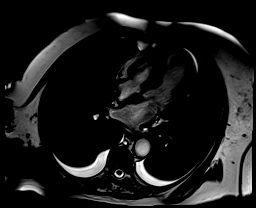

[Series 27: (id)_long_t1 · oblique · 8.0mm · 1.56mm/px · 1 of 24 slices shown]
[im 1/24]
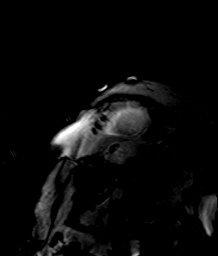

[Series 28: (id)_long_t1_moco · oblique · 8.0mm · 1.56mm/px · 1 of 24 slices shown]
[im 1/24]
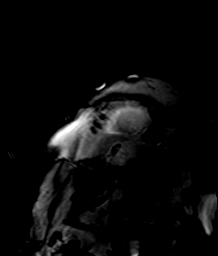

[Series 29: (id)_long_t1_moco_t1 · 1 of 3 slices shown (1 of 2)]
[im 1/3]
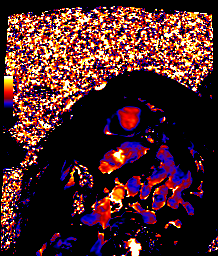

[Series 29: (id)_long_t1_moco_t1 · oblique · 8.0mm · 1.56mm/px · 1 of 3 slices shown (2 of 2)]
[im 1/3]
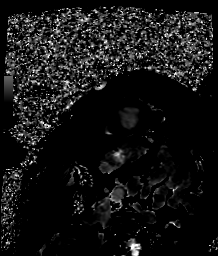

[Series 31: (id)_trufi · oblique · 8.0mm · 2.08mm/px · 1 of 9 slices shown]
[im 1/9]
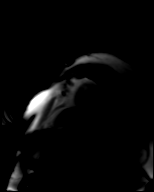

[Series 32: (id)_trufi_moco · oblique · 8.0mm · 2.08mm/px · 1 of 9 slices shown]
[im 1/9]
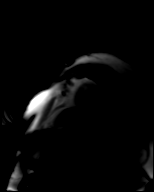

[Series 33: (id)_trufi_moco_t2 · oblique · 8.0mm · 2.08mm/px · 1 of 3 slices shown]
[im 1/3]
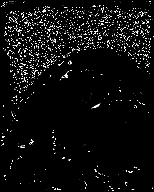

[Series 35: cine_trufi_cs_rt_short axis · oblique · 8.0mm · 1.73mm/px · 1 of 14 slices shown (1 of 16)]
[im 1/14]
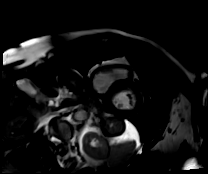

[Series 35: cine_trufi_cs_rt_short axis · oblique · 8.0mm · 1.73mm/px · 1 of 14 slices shown (2 of 16)]
[im 1/14]
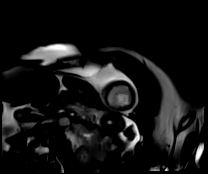

[Series 35: cine_trufi_cs_rt_short axis · oblique · 8.0mm · 1.73mm/px · 1 of 14 slices shown (3 of 16)]
[im 1/14]
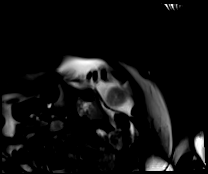

[Series 35: cine_trufi_cs_rt_short axis · oblique · 8.0mm · 1.73mm/px · 1 of 14 slices shown (4 of 16)]
[im 1/14]
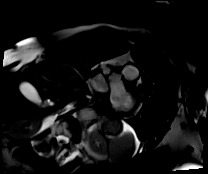

[Series 35: cine_trufi_cs_rt_short axis · oblique · 8.0mm · 1.73mm/px · 1 of 14 slices shown (5 of 16)]
[im 1/14]
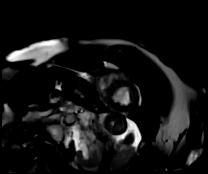

[Series 35: cine_trufi_cs_rt_short axis · oblique · 8.0mm · 1.73mm/px · 1 of 14 slices shown (6 of 16)]
[im 1/14]
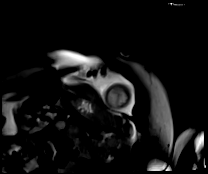

[Series 35: cine_trufi_cs_rt_short axis · oblique · 8.0mm · 1.73mm/px · 1 of 14 slices shown (7 of 16)]
[im 1/14]
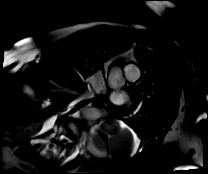

[Series 35: cine_trufi_cs_rt_short axis · oblique · 8.0mm · 1.73mm/px · 1 of 14 slices shown (8 of 16)]
[im 1/14]
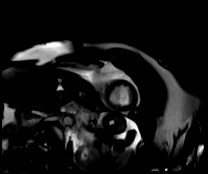

[Series 35: cine_trufi_cs_rt_short axis · oblique · 8.0mm · 1.73mm/px · 1 of 14 slices shown (9 of 16)]
[im 1/14]
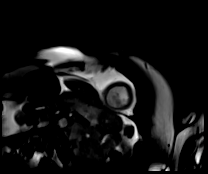

[Series 35: cine_trufi_cs_rt_short axis · oblique · 8.0mm · 1.73mm/px · 1 of 14 slices shown (10 of 16)]
[im 1/14]
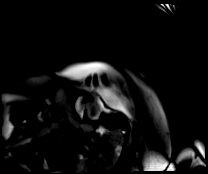

[Series 35: cine_trufi_cs_rt_short axis · oblique · 8.0mm · 1.73mm/px · 1 of 14 slices shown (11 of 16)]
[im 1/14]
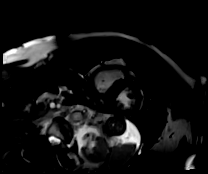

[Series 35: cine_trufi_cs_rt_short axis · oblique · 8.0mm · 1.73mm/px · 1 of 14 slices shown (12 of 16)]
[im 1/14]
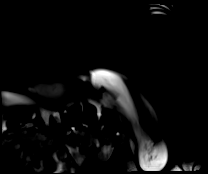

[Series 35: cine_trufi_cs_rt_short axis · oblique · 8.0mm · 1.73mm/px · 1 of 14 slices shown (13 of 16)]
[im 1/14]
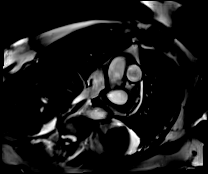

[Series 35: cine_trufi_cs_rt_short axis · oblique · 8.0mm · 1.73mm/px · 1 of 14 slices shown (14 of 16)]
[im 1/14]
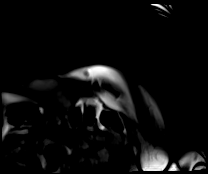

[Series 35: cine_trufi_cs_rt_short axis · oblique · 8.0mm · 1.73mm/px · 1 of 14 slices shown (15 of 16)]
[im 1/14]
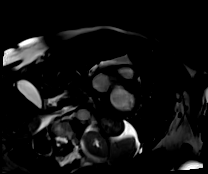

[Series 35: cine_trufi_cs_rt_short axis · oblique · 8.0mm · 1.73mm/px · 1 of 14 slices shown (16 of 16)]
[im 1/14]
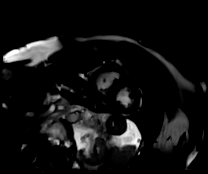

[Series 36: pre short axis · oblique · non-contrast · 8.0mm · 2.25mm/px · 1 of 10 slices shown (1 of 2)]
[im 1/10]
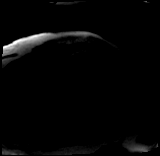

[Series 37: pre short axis · oblique · non-contrast · 8.0mm · 2.25mm/px · 1 of 10 slices shown (2 of 2)]
[im 1/10]
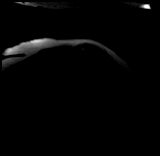

[45 of 48 positions shown; findings below may reference images not displayed]

FINDINGS: 1. Normal left ventricular size, with LVEDD 39 mm, and LVEDVi 60
mL/m2.

Moderate asymmetric septal hypertrophy, with intraventricular septal
thickness of 14 mm, posterior wall thickness of 8 mm, and myocardial
mass index mildly increased at 66 g/m2.

Normal left ventricular systolic function (LVEF =33%). Global
hypokinesis with hypokinesis in the basal, anteroseptal and severe
hypokinesis mid and apex. Notably, mid inferior, inferoseptal and
apical inferior are akinetic.

There is a small LV apical thrombus.

There is no late gadolinium enhancement in the left ventricular
myocardium.

2. Normal right ventricular size with RVEDVI 29 mL/m2.

Normal right ventricular thickness.

Normal right ventricular systolic function (RVEF =46%). There are no
regional wall motion abnormalities or aneurysms.

3.  Normal left and right atrial size.

4.  Normal size of the pulmonary artery.

Ascending aorta is at the upper limit of normal for age and BSA, 39
mm.

5. Valve assessment:

Aortic Valve: Not well visualized. Qualitatively, there is no
significant regurgitation.

Pulmonic Valve: Qualitatively, there is no significant
regurgitation.

Tricuspid Valve: Qualitatively, there is no significant
regurgitation.

Mitral Valve: Abnormally thickened with posterior leaflet
restriction. There is no significant regurgitation.

6. Normal pericardium. No pericardial effusion. Prominent
pericardial fat.

7. Grossly, no additional extracardiac findings, outside of
bilateral pleural effusions. Recommended dedicated study if
concerned for non-cardiac pathology.
IMPRESSION: No evidence of late gadolinium enhancement-suggestive of viable
myocardium.

Small LV thrombus.

ADDENDUM:
Reviewed REESE with Primary IONE. Though there is some evidence of
patchy LGE at the basal septum in the PSAX view, there is no
transmural LGE in the mid or apex. LAD territory shows evidence of
viability.

*** End of Addendum ***
FINDINGS: 1. Normal left ventricular size, with LVEDD 39 mm, and LVEDVi 60
mL/m2.

Moderate asymmetric septal hypertrophy, with intraventricular septal
thickness of 14 mm, posterior wall thickness of 8 mm, and myocardial
mass index mildly increased at 66 g/m2.

Normal left ventricular systolic function (LVEF =33%). Global
hypokinesis with hypokinesis in the basal, anteroseptal and severe
hypokinesis mid and apex. Notably, mid inferior, inferoseptal and
apical inferior are akinetic.

There is a small LV apical thrombus.

There is no late gadolinium enhancement in the left ventricular
myocardium.

2. Normal right ventricular size with RVEDVI 29 mL/m2.

Normal right ventricular thickness.

Normal right ventricular systolic function (RVEF =46%). There are no
regional wall motion abnormalities or aneurysms.

3.  Normal left and right atrial size.

4.  Normal size of the pulmonary artery.

Ascending aorta is at the upper limit of normal for age and BSA, 39
mm.

5. Valve assessment:

Aortic Valve: Not well visualized. Qualitatively, there is no
significant regurgitation.

Pulmonic Valve: Qualitatively, there is no significant
regurgitation.

Tricuspid Valve: Qualitatively, there is no significant
regurgitation.

Mitral Valve: Abnormally thickened with posterior leaflet
restriction. There is no significant regurgitation.

6. Normal pericardium. No pericardial effusion. Prominent
pericardial fat.

7. Grossly, no additional extracardiac findings, outside of
bilateral pleural effusions. Recommended dedicated study if
concerned for non-cardiac pathology.
IMPRESSION: No evidence of late gadolinium enhancement-suggestive of viable
myocardium.

Small LV thrombus.

## 2022-07-19 ENCOUNTER — Other Ambulatory Visit: Payer: Self-pay

## 2022-07-19 ENCOUNTER — Encounter (HOSPITAL_COMMUNITY): Payer: Self-pay | Admitting: Emergency Medicine

## 2022-07-19 ENCOUNTER — Emergency Department (HOSPITAL_COMMUNITY)
Admission: EM | Admit: 2022-07-19 | Discharge: 2022-07-20 | Disposition: A | Discharge status: Home / Self Care | Payer: Medicare Other | Attending: Emergency Medicine | Admitting: Emergency Medicine

## 2022-07-19 DIAGNOSIS — I10 Essential (primary) hypertension: Secondary | ICD-10-CM | POA: Diagnosis not present

## 2022-07-19 DIAGNOSIS — M79661 Pain in right lower leg: Secondary | ICD-10-CM | POA: Diagnosis not present

## 2022-07-19 DIAGNOSIS — M79604 Pain in right leg: Secondary | ICD-10-CM | POA: Insufficient documentation

## 2022-07-19 DIAGNOSIS — Z7982 Long term (current) use of aspirin: Secondary | ICD-10-CM | POA: Insufficient documentation

## 2022-07-19 DIAGNOSIS — I2510 Atherosclerotic heart disease of native coronary artery without angina pectoris: Secondary | ICD-10-CM | POA: Insufficient documentation

## 2022-07-19 NOTE — ED Triage Notes (Signed)
Pt with c/o R leg pain that initially started behind right knee and now has traveled to her calf. Denies any injury.

## 2022-07-20 ENCOUNTER — Ambulatory Visit (HOSPITAL_COMMUNITY): Admission: RE | Admit: 2022-07-20 | Payer: Medicare Other | Source: Ambulatory Visit

## 2022-07-20 ENCOUNTER — Telehealth: Payer: Self-pay | Admitting: Family Medicine

## 2022-07-20 DIAGNOSIS — F419 Anxiety disorder, unspecified: Secondary | ICD-10-CM

## 2022-07-20 MED ORDER — ALPRAZOLAM 0.5 MG PO TABS
0.5000 mg | ORAL_TABLET | Freq: Once | ORAL | 0 refills | Status: DC | PRN
Start: 2022-07-20 — End: 2022-09-28

## 2022-07-20 MED ORDER — ATORVASTATIN CALCIUM 20 MG PO TABS: 20.0000 mg | ORAL_TABLET | Freq: Every day | ORAL | 1 refills | Status: DC

## 2022-07-20 NOTE — ED Provider Notes (Signed)
[place] EMERGENCY DEPARTMENT AT [facility] Provider Note   CSN: [id number] Arrival date & time: 07/19/22  2348     History  Chief Complaint  Patient presents with   Leg Pain    [name] is a 77 y.o. female.  Patient is a 77 year old female with past medical history of hypertension, coronary artery disease with prior MI, hyperlipidemia.  Patient presenting today with complaints of right calf pain.  This has been worsening over the past 2 days.  She does report performing activities recently she does not normally do, but denies any specific injury or trauma.  She denies swelling to the lower leg.  Pain is worse when she ambulates.  There is no chest pain or shortness of breath.  The history is provided by the patient.       Home Medications Prior to Admission medications   Medication Sig Start Date End Date Taking? Authorizing Provider  ALPRAZolam Prudy Feeler) 0.5 MG tablet Take 1 tablet (0.5 mg total) by mouth once as needed for up to 1 dose. 06/29/22   Del Nigel Berthold, FNP  aspirin EC 81 MG tablet Take 1 tablet (81 mg total) by mouth daily. Swallow whole. 09/08/21   Dyann Kief, PA-C  bisacodyl (DULCOLAX) 10 MG suppository Place 1 suppository (10 mg total) rectally daily as needed for moderate constipation. 08/09/21   Barrett, Joline Salt, PA-C  Calcium Carb-Cholecalciferol (OYSTER SHELL CALCIUM W/D) 500-5 MG-MCG TABS Take by mouth.    [provider]  clopidogrel (PLAVIX) 75 MG tablet Take 1 tablet (75 mg total) by mouth daily. 02/13/22   Sharlene Dory, NP  cyanocobalamin (,VITAMIN B-12,) 1000 MCG/ML injection Inject 1 mcg into the muscle every 30 (thirty) days. 07/10/21   [provider]  dapagliflozin propanediol (FARXIGA) 10 MG TABS tablet TAKE ONE TABLET BY MOUTH EVERY DAY 04/10/22   Antoine Poche, MD  diclofenac Sodium (VOLTAREN) 1 % GEL Apply 2 g topically 4 (four) times daily as needed (pain). 08/19/17   [provider]   DULoxetine (CYMBALTA) 30 MG capsule Take 30 mg by mouth 3 (three) times daily. 02/20/21   [provider]  fluticasone (FLONASE) 50 MCG/ACT nasal spray Place 2 sprays into both nostrils daily as needed for allergies. 07/30/21   [provider]  furosemide (LASIX) 40 MG tablet Take 1 tablet (40 mg total) by mouth as needed (shortness of breath or weight gain). 02/13/22   Sharlene Dory, NP  gabapentin (NEURONTIN) 800 MG tablet Take 800 mg by mouth 3 (three) times daily. 12/25/20   [provider]  losartan (COZAAR) 25 MG tablet Take 0.5 tablets (12.5 mg total) by mouth daily. 05/26/22   Sharlene Dory, NP  nitroGLYCERIN (NITROSTAT) 0.4 MG SL tablet Place 1 tablet (0.4 mg total) under the tongue every 5 (five) minutes x 3 doses as needed for chest pain. 08/09/21   Barrett, Joline Salt, PA-C  oxycodone (ROXICODONE) 30 MG immediate release tablet Take 30 mg by mouth 3 (three) times daily as needed for pain. 11/20/20   [provider]  pantoprazole (PROTONIX) 40 MG tablet Take 40 mg by mouth daily. 07/30/21   [provider]  polyethylene glycol powder (GLYCOLAX/MIRALAX) 17 GM/SCOOP powder Take 1 Container by mouth once.    [provider]  potassium chloride SA (KLOR-CON M) 20 MEQ tablet Take 1 tablet (20 mEq total) by mouth as needed (only on the days that you take the Lasix). 02/13/22   Sharlene Dory,  NP  tiZANidine (ZANAFLEX) 4 MG tablet Take 4 mg by mouth 3 (three) times daily. 06/04/22   [provider]  triamcinolone ointment (KENALOG) 0.5 % Apply 1 Application topically 2 (two) times daily. 06/22/22   Del Nigel Berthold, FNP      Allergies    Morphine    Review of Systems   Review of Systems  All other systems reviewed and are negative.   Physical Exam Updated Vital Signs BP (!) 142/68 (BP Location: Right Arm)   Pulse 76   Temp 98.2 F (36.8 C) (Oral)   Resp 16   Ht 5\' 2"  (1.575 m)   Wt 70 kg   SpO2 99%   BMI 28.23  kg/m  Physical Exam Vitals and nursing note reviewed.  Constitutional:      General: She is not in acute distress.    Appearance: She is well-developed. She is not diaphoretic.  HENT:     Head: Normocephalic and atraumatic.  Cardiovascular:     Rate and Rhythm: Normal rate and regular rhythm.     Heart sounds: No murmur heard.    No friction rub. No gallop.  Pulmonary:     Effort: Pulmonary effort is normal. No respiratory distress.     Breath sounds: Normal breath sounds. No wheezing.  Abdominal:     General: Bowel sounds are normal. There is no distension.     Palpations: Abdomen is soft.     Tenderness: There is no abdominal tenderness.  Musculoskeletal:        General: Normal range of motion.     Cervical back: Normal range of motion and neck supple.     Comments: The right lower extremity is grossly normal in appearance.  There is mild tenderness to the posterior calf, but no palpable cord.  DP pulses are palpable and motor and sensation are intact to the entire foot.  Denna Haggard' sign is absent.  Skin:    General: Skin is warm and dry.  Neurological:     General: No focal deficit present.     Mental Status: She is alert and oriented to person, place, and time.     ED Results / Procedures / Treatments   Labs (all labs ordered are listed, but only abnormal results are displayed) Labs Reviewed - No data to display  EKG None  Radiology No results found.  Procedures Procedures    Medications Ordered in ED Medications - No data to display  ED Course/ Medical Decision Making/ A&P  Patient presenting with right calf pain as described in the HPI.  Symptoms sound musculoskeletal, but feel as though DVT needs to be ruled out.  As there is no in-house ultrasound at night, patient will return in the morning for this study.  Final Clinical Impression(s) / ED Diagnoses Final diagnoses:  None    Rx / DC Orders ED Discharge Orders          Ordered    US Venous Img  Lower Unilateral Right        07/20/22 0044              Geoffery Lyons, MD 07/20/22 [phone]

## 2022-07-20 NOTE — Telephone Encounter (Signed)
Pt called in to recent ER visit. Wants to let pcp know she was recently seen in ER  for back pain. Checking for blood clot. Pt has not had ultrasound yet , will be going to have it done  Also wants a cll back in regard to antibiotic interfering with Plavix.

## 2022-07-20 NOTE — Telephone Encounter (Signed)
Pt calling back in  regards to previous tele encounter. Also requested rx     ALPRAZolam (XANAX) 0.5 MG tablet [[id number]]

## 2022-07-20 NOTE — Telephone Encounter (Signed)
Spoke with patient.

## 2022-07-20 NOTE — Telephone Encounter (Signed)
Patient called back requesting if provider will be calling in the medicine in or not. Patient asked if nurse will please give her a call either way # [phone]

## 2022-07-20 NOTE — Telephone Encounter (Signed)
Refill sent and medication does not interfere. Xanax is not recommend for patients her age- use with caution

## 2022-07-20 NOTE — Discharge Instructions (Signed)
Return tomorrow at the given time for an ultrasound of your leg to rule out a blood clot. °

## 2022-07-20 NOTE — Telephone Encounter (Signed)
Patient informed and verbalized understanding of plan. Lab order faxed to [facility].  

## 2022-07-21 ENCOUNTER — Telehealth (HOSPITAL_COMMUNITY): Payer: Self-pay | Admitting: Student

## 2022-07-21 ENCOUNTER — Ambulatory Visit (HOSPITAL_COMMUNITY)
Admission: RE | Admit: 2022-07-21 | Discharge: 2022-07-21 | Disposition: A | Discharge status: Home / Self Care | Payer: Medicare Other | Source: Ambulatory Visit | Attending: Emergency Medicine | Admitting: Emergency Medicine

## 2022-07-21 ENCOUNTER — Telehealth: Payer: Self-pay

## 2022-07-21 DIAGNOSIS — M79604 Pain in right leg: Secondary | ICD-10-CM

## 2022-07-21 NOTE — Telephone Encounter (Signed)
Patient return to the emergency department for DVT ultrasound.  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  I personally saw the patient and went over these results with her and provided an Ace wrap for comfort.  Patient already has a follow-up with [facility] on Friday and will discuss these findings with them.

## 2022-07-21 NOTE — Transitions of Care (Post Inpatient/ED Visit) (Signed)
07/21/2022  Name: [name] MRN: [id number] DOB: [birth year]  Today's TOC FU Call Status: Today's TOC FU Call Status:: Successful TOC FU Call Competed TOC FU Call Complete Date: 07/21/22  Transition Care Management Follow-up Telephone Call Date of Discharge: 07/20/22 [facility] Type of Discharge: Emergency Department Reason for ED Visit: Other: (Right leg pain) How have you been since you were released from the hospital?: Same (swelling hasn't gone down, Ultrasound today showed no DVT- she is following up with Ortho Friday 07/24/22. ER wrapped in ace bandage.) Any questions or concerns?: No  Items Reviewed: Did you receive and understand the discharge instructions provided?: Yes Medications obtained,verified, and reconciled?: Yes (Medications Reviewed) Any new allergies since your discharge?: No Dietary orders reviewed?: NA Do you have support at home?: Yes People in Home: child(ren), adult  Medications Reviewed Today: Medications Reviewed Today     Reviewed by Leigh Aurora, CMA (Certified Medical Assistant) on 07/21/22 at 1619  Med List Status: <None>   Medication Order Taking? Sig Documenting Provider Last Dose Status Informant  ALPRAZolam (XANAX) 0.5 MG tablet [id number]  Take 1 tablet (0.5 mg total) by mouth once as needed for up to 1 dose. Del Newman Nip, Tenna Child, FNP  Active   aspirin EC 81 MG tablet [id number] No Take 1 tablet (81 mg total) by mouth daily. Swallow whole. Dyann Kief, PA-C Taking Active   atorvastatin (LIPITOR) 20 MG tablet [id number]  Take 1 tablet (20 mg total) by mouth daily. Sharlene Dory, NP  Active   bisacodyl (DULCOLAX) 10 MG suppository [id number] No Place 1 suppository (10 mg total) rectally daily as needed for moderate constipation. Barrett, Joline Salt, PA-C Taking Active Self, Pharmacy Records  Calcium Carb-Cholecalciferol (OYSTER SHELL CALCIUM W/D) 500-5 MG-MCG TABS [id number] No Take by mouth. [provider] Taking Active Self, Pharmacy Records  clopidogrel (PLAVIX) 75 MG tablet [id number] No Take 1 tablet (75 mg total) by mouth daily. Sharlene Dory, NP Taking Active   cyanocobalamin (,VITAMIN B-12,) 1000 MCG/ML injection [id number] No Inject 1 mcg into the muscle every 30 (thirty) days. [provider] Taking Active Self, Pharmacy Records  dapagliflozin propanediol (FARXIGA) 10 MG TABS tablet [id number] No TAKE ONE TABLET BY MOUTH EVERY DAY Branch, Dorothe Pea, MD Taking Active   diclofenac Sodium (VOLTAREN) 1 % GEL [id number] No Apply 2 g topically 4 (four) times daily as needed (pain). [provider] Taking Active Self, Pharmacy Records  DULoxetine (CYMBALTA) 30 MG capsule [id number] No Take 30 mg by mouth 3 (three) times daily. [provider] Taking Active Self, Pharmacy Records  fluticasone [facility]) 50 MCG/ACT nasal spray [id number] No Place 2 sprays into both nostrils daily as needed for allergies. [provider] Taking Active Self, Pharmacy Records  furosemide (LASIX) 40 MG tablet [id number] No Take 1 tablet (40 mg total) by mouth as needed (shortness of breath or weight gain). Sharlene Dory, NP Taking Active   gabapentin (NEURONTIN) 800 MG tablet [id number] No Take 800 mg by mouth 3 (three) times daily. [provider] Taking Active Self, Pharmacy Records  losartan (COZAAR) 25 MG tablet [id number] No Take 0.5 tablets (12.5 mg total) by mouth daily. Sharlene Dory, NP Taking Active   nitroGLYCERIN (NITROSTAT) 0.4 MG SL tablet [id number] No Place 1 tablet (0.4 mg total) under the tongue every 5 (five) minutes x 3 doses as needed for chest pain. Barrett, Joline Salt, PA-C Taking Active Self, Pharmacy Records  oxycodone (ROXICODONE) 30 MG immediate release tablet  [id number] No Take 30 mg by mouth 3 (three) times daily as needed for pain. [provider] Taking Active Self, Pharmacy Records  pantoprazole (PROTONIX) 40 MG tablet [id number] No  Take 40 mg by mouth daily. [provider] Taking Active Self, Pharmacy Records  polyethylene glycol powder (GLYCOLAX/MIRALAX) 17 GM/SCOOP powder [id number] No Take 1 Container by mouth once. [provider] Taking Active   potassium chloride SA (KLOR-CON M) 20 MEQ tablet [id number] No Take 1 tablet (20 mEq total) by mouth as needed (only on the days that you take the Lasix). Sharlene Dory, NP Taking Active   tiZANidine (ZANAFLEX) 4 MG tablet [id number] No Take 4 mg by mouth 3 (three) times daily. [provider] Taking Active   triamcinolone ointment (KENALOG) 0.5 % [id number] No Apply 1 Application topically 2 (two) times daily. Del Nigel Berthold, FNP Taking Active             Home Care and Equipment/Supplies: Were Home Health Services Ordered?: NA Any new equipment or medical supplies ordered?: NA  Functional Questionnaire: Do you need assistance with bathing/showering or dressing?: No Do you need assistance with meal preparation?: No Do you need assistance with eating?: No Do you have difficulty maintaining continence: No Do you need assistance with getting out of bed/getting out of a chair/moving?: No Do you have difficulty managing or taking your medications?: No  Follow up appointments reviewed: PCP Follow-up appointment confirmed?: No (declined- will follow up with specialist) MD Provider Line [phone] Given: Yes Specialist Hospital Follow-up appointment confirmed?: Yes Date of Specialist follow-up appointment?: 07/24/22 Follow-Up Specialty Provider:: [facility] Do you need transportation to your follow-up appointment?: No Do you understand care options if your condition(s) worsen?: Yes-patient verbalized understanding    SIGNATURE Agnes Lawrence, CMA (AAMA)  [facility] [phone]

## 2022-07-22 ENCOUNTER — Telehealth: Payer: Self-pay | Admitting: Family Medicine

## 2022-07-22 NOTE — Telephone Encounter (Signed)
Noted  

## 2022-07-22 NOTE — Telephone Encounter (Signed)
Patient called had to cancel chest xray in [place] having a lot of leg pain will call back to reschedule once leg gets to feeling better, unable to put weight barring on leg.

## 2022-07-24 ENCOUNTER — Ambulatory Visit (HOSPITAL_BASED_OUTPATIENT_CLINIC_OR_DEPARTMENT_OTHER): Payer: Medicare Other

## 2022-07-25 ENCOUNTER — Other Ambulatory Visit (HOSPITAL_BASED_OUTPATIENT_CLINIC_OR_DEPARTMENT_OTHER): Payer: Medicare Other

## 2022-07-30 ENCOUNTER — Other Ambulatory Visit: Payer: Self-pay

## 2022-07-30 ENCOUNTER — Telehealth: Payer: Self-pay | Admitting: Family Medicine

## 2022-07-30 DIAGNOSIS — F419 Anxiety disorder, unspecified: Secondary | ICD-10-CM

## 2022-07-30 NOTE — Telephone Encounter (Signed)
Prescription Request  07/30/2022  LOV: 06/29/2022  What is the name of the medication or equipment? ALPRAZolam (XANAX) 0.5 MG tablet [[id number]]    Have you contacted your pharmacy to request a refill? Yes   Which pharmacy would you like this sent to?  [facility] - [place], [place] - [address] [place] [address] Phone: [phone] Fax: [phone]    Patient notified that their request is being sent to the clinical staff for review and that they should receive a response within 2 business days.   Please advise at [facility] [phone]

## 2022-07-30 NOTE — Telephone Encounter (Signed)
This prescription is monthly refilled  Last refill was on 07/20/22

## 2022-08-07 ENCOUNTER — Encounter: Payer: Self-pay | Admitting: Internal Medicine

## 2022-08-07 ENCOUNTER — Other Ambulatory Visit: Payer: Self-pay | Admitting: *Deleted

## 2022-08-07 DIAGNOSIS — E785 Hyperlipidemia, unspecified: Secondary | ICD-10-CM

## 2022-08-07 DIAGNOSIS — R7989 Other specified abnormal findings of blood chemistry: Secondary | ICD-10-CM

## 2022-08-07 DIAGNOSIS — Z79899 Other long term (current) drug therapy: Secondary | ICD-10-CM

## 2022-08-12 ENCOUNTER — Telehealth: Payer: Self-pay | Admitting: Family Medicine

## 2022-08-12 NOTE — Telephone Encounter (Signed)
Patient called in regard to behavioral health.  Wants a call back in regard.   Patient also states that she will be finding a new pcp

## 2022-08-28 ENCOUNTER — Ambulatory Visit (HOSPITAL_BASED_OUTPATIENT_CLINIC_OR_DEPARTMENT_OTHER)
Admission: RE | Admit: 2022-08-28 | Discharge: 2022-08-28 | Disposition: A | Discharge status: Home / Self Care | Payer: Medicare Other | Source: Ambulatory Visit | Attending: Pulmonary Disease | Admitting: Pulmonary Disease

## 2022-08-28 DIAGNOSIS — R911 Solitary pulmonary nodule: Secondary | ICD-10-CM | POA: Insufficient documentation

## 2022-08-28 DIAGNOSIS — M545 Low back pain, unspecified: Secondary | ICD-10-CM | POA: Insufficient documentation

## 2022-08-28 DIAGNOSIS — M549 Dorsalgia, unspecified: Secondary | ICD-10-CM | POA: Insufficient documentation

## 2022-08-28 HISTORY — DX: Low back pain, unspecified: M54.50

## 2022-09-01 ENCOUNTER — Encounter: Payer: Self-pay | Admitting: Pulmonary Disease

## 2022-09-01 ENCOUNTER — Ambulatory Visit: Payer: Medicare Other | Admitting: Acute Care

## 2022-09-01 ENCOUNTER — Encounter: Payer: Self-pay | Admitting: Acute Care

## 2022-09-01 ENCOUNTER — Ambulatory Visit (INDEPENDENT_AMBULATORY_CARE_PROVIDER_SITE_OTHER): Payer: Medicare Other | Admitting: Pulmonary Disease

## 2022-09-01 ENCOUNTER — Other Ambulatory Visit: Payer: Self-pay | Admitting: Physician Assistant

## 2022-09-01 VITALS — BP 136/88 | HR 65 | Temp 97.6°F | Ht 63.0 in | Wt 148.2 lb

## 2022-09-01 DIAGNOSIS — F17210 Nicotine dependence, cigarettes, uncomplicated: Secondary | ICD-10-CM

## 2022-09-01 DIAGNOSIS — R911 Solitary pulmonary nodule: Secondary | ICD-10-CM | POA: Diagnosis not present

## 2022-09-01 DIAGNOSIS — F17200 Nicotine dependence, unspecified, uncomplicated: Secondary | ICD-10-CM | POA: Diagnosis not present

## 2022-09-01 NOTE — Patient Instructions (Addendum)
It is good to see you today. We have reviewed your CT Chest. The nodule of concern has persisted. We have discussed watching the nodule with CT scans vs going ahead and doing a biopsy now.  You prefer biopsies now. We have scheduled you for bronchoscopy with biopsy 09/14/2022.  We have discussed the risk of complications to include infection, bleeding, reaction to anesthesia, and puncture of the lung.  You will need someone with you for the procedure and for 24 hours after . We will have cardiology give clearance for general anesthesia. You will need to hold your plavix for 5 full days before this procedure.  Hold 7/10-7/15.  You will need a follow up appointment with Maralyn Sago NP 1 week after procedure.  Please work  on quitting smoking completely. Call 1-800-QUIT NOW for free nicotine patches, gum or mints.  We will take good care of you. Please contact office for sooner follow up if symptoms do not improve or worsen or seek emergency care

## 2022-09-01 NOTE — Patient Instructions (Signed)
Full PFT performed today. °

## 2022-09-01 NOTE — Progress Notes (Signed)
Full PFT performed today. °

## 2022-09-01 NOTE — Progress Notes (Unsigned)
History of Present Illness [name] is a 77 y.o. female current some day smoker with a 40 + pack year smoking history and history of  non-Hodgkin's lymphoma referred to Dr. Tonia Brooms for evaluation of a lung nodule.   Synopsis  09/01/2022 Pt. Presents for follow up after PFT's and repeat Super D CT Chest to re-evaluate the left upper lobe . The nodule has persisted. We have discussed the options of biopsy now vs continued imaging. She is opting for biopsy now. PFT's show **. DLCO is normal    CT chest July 2023: Left upper lobe subsolid groundglass lesion. Evidence of emphysema. Test Results:     Latest Ref Rng & Units 07/06/2022    2:09 PM 06/15/2022    3:16 PM 03/11/2022    4:24 PM  CBC  WBC 4.0 - 10.5 K/uL 5.4  4.9  6.3   Hemoglobin 12.0 - 15.0 g/dL 40.9  81.1  91.4   Hematocrit 36.0 - 46.0 % 43.1  44.8  40.4   Platelets 150 - 400 K/uL 177  197  252        Latest Ref Rng & Units 07/06/2022    2:09 PM 06/15/2022    3:17 PM 05/18/2022   12:33 PM  BMP  Glucose 70 - 99 mg/dL 89  95  782   BUN 8 - 23 mg/dL 15  16  20    Creatinine 0.44 - 1.00 mg/dL 9.56  2.13  0.86   Sodium 135 - 145 mmol/L 139  138  137   Potassium 3.5 - 5.1 mmol/L 3.6  3.9  4.1   Chloride 98 - 111 mmol/L 100  103  103   CO2 22 - 32 mmol/L 30  29  26    Calcium 8.9 - 10.3 mg/dL 9.1  8.8  9.2     BNP    Component Value Date/Time   BNP 513.5 (H) 08/05/2021 0148    ProBNP No results found for: "PROBNP"  PFT No results found for: "FEV1PRE", "FEV1POST", "FVCPRE", "FVCPOST", "TLC", "DLCOUNC", "PREFEV1FVCRT", "PSTFEV1FVCRT"  CT Super D Chest Wo Contrast  Result Date: 09/01/2022 CLINICAL DATA:  Follow-up lung nodule. EXAM: CT CHEST WITHOUT CONTRAST TECHNIQUE: Multidetector CT imaging of the chest was performed using thin slice collimation for electromagnetic bronchoscopy planning purposes, without intravenous contrast. RADIATION DOSE REDUCTION: This exam was performed according to the departmental  dose-optimization program which includes automated exposure control, adjustment of the mA and/or kV according to patient size and/or use of iterative reconstruction technique. COMPARISON:  2 09/23/2021 FINDINGS: Cardiovascular: Heart size is within normal limits. Aortic atherosclerosis and coronary artery calcification. No pericardial effusion. Mediastinum/Nodes: No enlarged mediastinal, hilar, or axillary lymph nodes. Thyroid gland, trachea, and esophagus demonstrate no significant findings. Lungs/Pleura: No pleural fluid or airspace consolidation. Within the posteromedial left upper lobe there is a non-nodule which measures 1.1 x 0.7 by 1.5 cm, image 39/4 and image 41/5. This is unchanged when compared with the previous exam. 5 mm non solid nodule in the anterior left apex is unchanged, image 16/4. Several tiny (less than 5 mm). Solid nodules within the right upper lobe are unchanged from the prior exam compatible with a benign process. Upper Abdomen: No acute abnormality. Musculoskeletal: Thoracic spondylosis. Previous ACDF. No acute or suspicious osseous findings. IMPRESSION: 1. Stable non-solid nodules in the left upper lobe measuring up to 1.5 cm. Adenocarcinoma cannot be excluded. Follow up by CT is recommended in 12 months, with continued annual surveillance for a minimum of  3 years. These recommendations are taken from: Recommendations for the Management of Subsolid Pulmonary Nodules Detected at CT: A Statement from the Fleischner Society Radiology 2013; 266:1, 161-096. 2. Coronary artery calcifications. 3. Aortic Atherosclerosis (ICD10-I70.0). Electronically Signed   By: Signa Kell M.D.   On: 09/01/2022 08:12     Past medical hx Past Medical History:  Diagnosis Date   Arthritis    CAD (coronary artery disease)    a. cath in 07/2021 showing severe multivessel CAD --> not felt to be a CABG candidate and limited viable myocardium --> received DESx2 to LAD   CHF (congestive heart failure) (HCC)     a. EF 30-35% by echo in 07/2021, normalized to 60-65% by repeat imaging later that month.   Hx of non-Hodgkin's lymphoma    Hyperlipidemia    Hypertension    Left ventricular apical thrombus    a. noted on cMRI in 07/2021     Social History   Tobacco Use   Smoking status: Former    Types: Cigarettes    Quit date: 07/03/2022    Years since quitting: 0.1    Passive exposure: Never   Smokeless tobacco: Never   Tobacco comments:    Quit 07/03/2022 07/08/22 [place]  Vaping Use   Vaping Use: Never used  Substance Use Topics   Alcohol use: Not Currently   Drug use: Never    Ms.Sill reports that she quit smoking about 1 months ago. Her smoking use included cigarettes. She has never been exposed to tobacco smoke. She has never used smokeless tobacco. She reports that she does not currently use alcohol. She reports that she does not use drugs.  Tobacco Cessation: Counseling given: Not Answered Tobacco comments: Quit 07/03/2022 07/08/22 [name]   Past surgical hx, Family hx, Social hx all reviewed.  Current Outpatient Medications on File Prior to Visit  Medication Sig   ALPRAZolam (XANAX) 0.5 MG tablet Take 1 tablet (0.5 mg total) by mouth once as needed for up to 1 dose.   aspirin EC 81 MG tablet Take 1 tablet (81 mg total) by mouth daily. Swallow whole.   atorvastatin (LIPITOR) 20 MG tablet Take 1 tablet (20 mg total) by mouth daily.   bisacodyl (DULCOLAX) 10 MG suppository Place 1 suppository (10 mg total) rectally daily as needed for moderate constipation.   Calcium Carb-Cholecalciferol (OYSTER SHELL CALCIUM W/D) 500-5 MG-MCG TABS Take by mouth.   cyanocobalamin (,VITAMIN B-12,) 1000 MCG/ML injection Inject 1 mcg into the muscle every 30 (thirty) days.   dapagliflozin propanediol (FARXIGA) 10 MG TABS tablet TAKE ONE TABLET BY MOUTH EVERY DAY   diclofenac Sodium (VOLTAREN) 1 % GEL Apply 2 g topically 4 (four) times daily as needed (pain).   DULoxetine (CYMBALTA) 30 MG capsule Take 30 mg by  mouth 3 (three) times daily.   fluticasone (FLONASE) 50 MCG/ACT nasal spray Place 2 sprays into both nostrils daily as needed for allergies.   furosemide (LASIX) 40 MG tablet Take 1 tablet (40 mg total) by mouth as needed (shortness of breath or weight gain).   gabapentin (NEURONTIN) 800 MG tablet Take 800 mg by mouth 3 (three) times daily.   losartan (COZAAR) 25 MG tablet Take 0.5 tablets (12.5 mg total) by mouth daily.   nitroGLYCERIN (NITROSTAT) 0.4 MG SL tablet Place 1 tablet (0.4 mg total) under the tongue every 5 (five) minutes x 3 doses as needed for chest pain.   oxycodone (ROXICODONE) 30 MG immediate release tablet Take 30 mg by mouth  3 (three) times daily as needed for pain.   pantoprazole (PROTONIX) 40 MG tablet Take 40 mg by mouth daily.   polyethylene glycol powder (GLYCOLAX/MIRALAX) 17 GM/SCOOP powder Take 1 Container by mouth once.   potassium chloride SA (KLOR-CON M) 20 MEQ tablet Take 1 tablet (20 mEq total) by mouth as needed (only on the days that you take the Lasix).   tiZANidine (ZANAFLEX) 4 MG tablet Take 4 mg by mouth 3 (three) times daily.   triamcinolone ointment (KENALOG) 0.5 % Apply 1 Application topically 2 (two) times daily.   No current facility-administered medications on file prior to visit.     Allergies  Allergen Reactions   Morphine Rash    Review Of Systems:  Constitutional:   No  weight loss, night sweats,  Fevers, chills, fatigue, or  lassitude.  HEENT:   No headaches,  Difficulty swallowing,  Tooth/dental problems, or  Sore throat,                No sneezing, itching, ear ache, nasal congestion, post nasal drip,   CV:  No chest pain,  Orthopnea, PND, swelling in lower extremities, anasarca, dizziness, palpitations, syncope.   GI  No heartburn, indigestion, abdominal pain, nausea, vomiting, diarrhea, change in bowel habits, loss of appetite, bloody stools.   Resp: No shortness of breath with exertion or at rest.  No excess mucus, no productive  cough,  No non-productive cough,  No coughing up of blood.  No change in color of mucus.  No wheezing.  No chest wall deformity  Skin: no rash or lesions.  GU: no dysuria, change in color of urine, no urgency or frequency.  No flank pain, no hematuria   MS:  No joint pain or swelling.  No decreased range of motion.  No back pain.  Psych:  No change in mood or affect. No depression or anxiety.  No memory loss.   Vital Signs BP 136/88   Pulse 65   Temp 97.6 F (36.4 C) (Oral)   Ht 5\' 3"  (1.6 m)   Wt 148 lb 3.2 oz (67.2 kg)   SpO2 97%   BMI 26.25 kg/m    Physical Exam:  General- No distress,  A&Ox3 ENT: No sinus tenderness, TM clear, pale nasal mucosa, no oral exudate,no post nasal drip, no LAN Cardiac: S1, S2, regular rate and rhythm, no murmur Chest: No wheeze/ rales/ dullness; no accessory muscle use, no nasal flaring, no sternal retractions Abd.: Soft Non-tender Ext: No clubbing cyanosis, edema Neuro:  normal strength Skin: No rashes, warm and dry Psych: normal mood and behavior   Assessment/Plan  No problem-specific Assessment & Plan notes found for this encounter.    Bevelyn Ngo, NP 09/01/2022  11:10 AM

## 2022-09-02 ENCOUNTER — Encounter: Payer: Self-pay | Admitting: Acute Care

## 2022-09-02 ENCOUNTER — Encounter: Payer: Self-pay | Admitting: *Deleted

## 2022-09-04 ENCOUNTER — Other Ambulatory Visit: Payer: Self-pay | Admitting: Physician Assistant

## 2022-09-04 ENCOUNTER — Telehealth: Payer: Self-pay | Admitting: Cardiology

## 2022-09-04 ENCOUNTER — Telehealth: Payer: Self-pay | Admitting: Pulmonary Disease

## 2022-09-04 DIAGNOSIS — R911 Solitary pulmonary nodule: Secondary | ICD-10-CM

## 2022-09-04 NOTE — Telephone Encounter (Signed)
Pt needs to cancel the bronchoscopy. Needs to reschedule.

## 2022-09-04 NOTE — Telephone Encounter (Signed)
I called pt to see when she could do bronch.  She states 7/15 will not work because it is in conflict with a procedure her dtr has to get done but she states she does not want to reschedule at this time.  She states she feels real uncomfortable about getting it done now.  She wants to think about it and will let us know if she decides to proceed.  She states she can not do 7/15 anyway.  I have cancelled bronch on 7/15 but wanted to make you aware.

## 2022-09-04 NOTE — Telephone Encounter (Signed)
Pt c/o medication issue:  1. Name of Medication:   dapagliflozin propanediol (FARXIGA) 10 MG TABS tablet   2. How are you currently taking this medication (dosage and times per day)? As prescribed  3. Are you having a reaction (difficulty breathing--STAT)?   4. What is your medication issue?   Patient wants a call back directly from [name] to discuss this medication.

## 2022-09-04 NOTE — Telephone Encounter (Signed)
Order placed for 3 month Chest CT and to schedule f/u With Sarah or Dr Tonia Brooms to review results.

## 2022-09-07 ENCOUNTER — Telehealth: Payer: Self-pay | Admitting: Nurse Practitioner

## 2022-09-07 NOTE — Telephone Encounter (Signed)
Called and spoke with patient per her request.  States she is in the donut hole in regards to [place].  States she is unable to afford this, and does not qualify for patient assistance program.  Told patient that we will make a note that she will stop [place] after her current medication prescription runs out.  I discussed with her to continue the rest of her medication regimen.   I recommend that at next office visit with Dr. Dina Rich in September, her blood pressure readings need to be reviewed and if blood pressure allows, increase losartan to 25 mg daily.  I believe Sherryll Burger will also not be beneficial, as cost will be an issue with this medication as well.  Pt verbalized understanding and was appreciative of my call.   Sharlene Dory, NP

## 2022-09-14 ENCOUNTER — Encounter (HOSPITAL_COMMUNITY): Admission: RE | Payer: Self-pay | Source: Home / Self Care

## 2022-09-14 ENCOUNTER — Ambulatory Visit (HOSPITAL_COMMUNITY): Admission: RE | Admit: 2022-09-14 | Payer: Medicare Other | Source: Home / Self Care | Admitting: Pulmonary Disease

## 2022-09-14 SURGERY — BRONCHOSCOPY, WITH BIOPSY USING ELECTROMAGNETIC NAVIGATION
Anesthesia: General

## 2022-09-21 ENCOUNTER — Telehealth: Payer: Self-pay | Admitting: Nurse Practitioner

## 2022-09-21 NOTE — Telephone Encounter (Signed)
Pt is requesting a callback from [facility]. She stated they've discussed something's already and she'd like to just touch bases with her. Please advise

## 2022-09-24 ENCOUNTER — Other Ambulatory Visit: Payer: Self-pay | Admitting: *Deleted

## 2022-09-24 ENCOUNTER — Telehealth: Payer: Self-pay | Admitting: Cardiology

## 2022-09-24 DIAGNOSIS — E785 Hyperlipidemia, unspecified: Secondary | ICD-10-CM

## 2022-09-24 DIAGNOSIS — Z79899 Other long term (current) drug therapy: Secondary | ICD-10-CM

## 2022-09-24 NOTE — Telephone Encounter (Signed)
Left message to return call 

## 2022-09-24 NOTE — Telephone Encounter (Signed)
\  Pt c/o BP issue: STAT if pt c/o blurred vision, one-sided weakness or slurred speech  1. What are your last 5 BP readings? Patient states that her bp has been alll over the place. 160s over 80s or 170s over 60.  2. Are you having any other symptoms (ex. Dizziness, headache, blurred vision, passed out)? no  3. What is your BP issue?   Wants to know what she needs to do. Please advise

## 2022-09-25 ENCOUNTER — Ambulatory Visit: Payer: Medicare Other | Admitting: Acute Care

## 2022-09-25 NOTE — Telephone Encounter (Signed)
No Answer. Left message to call back.

## 2022-09-28 ENCOUNTER — Other Ambulatory Visit: Payer: Self-pay | Admitting: Family Medicine

## 2022-09-28 DIAGNOSIS — F419 Anxiety disorder, unspecified: Secondary | ICD-10-CM

## 2022-09-28 MED ORDER — DAPAGLIFLOZIN PROPANEDIOL 10 MG PO TABS: 10.0000 mg | ORAL_TABLET | Freq: Every day | ORAL | 5 refills | Status: DC

## 2022-09-28 NOTE — Telephone Encounter (Signed)
Patient started back taking Farixga 10 Mg daily and states her BP is much better after starting back taking it. Patient States medication for 1 month supply is 98 dollars a Month. Advised patient I would ask EP if she wants her to continue on this medication due to they had talked about stopping it. Advised patient I could give her PAF for farxiga to see if she can get approved for assistance.

## 2022-09-28 NOTE — Telephone Encounter (Signed)
Noted. PAF forms awaiting patient pickup at the front of the office.

## 2022-09-28 NOTE — Telephone Encounter (Signed)
No answer left voice mail

## 2022-09-28 NOTE — Telephone Encounter (Signed)
Spoke with EP she would like to continue farxiga if patient can afford medication either with/without PAF. PAF forms given to patient on 09/28/22

## 2022-09-28 NOTE — Addendum Note (Signed)
Addended by: Sharen Hones on: 09/28/2022 10:21 AM   Modules accepted: Orders

## 2022-09-28 NOTE — Telephone Encounter (Signed)
Patient states she will come tomorrow to pick up forms. Please advise

## 2022-09-30 ENCOUNTER — Telehealth: Payer: Self-pay | Admitting: Cardiology

## 2022-09-30 NOTE — Telephone Encounter (Signed)
Pt c/o BP issue: STAT if pt c/o blurred vision, one-sided weakness or slurred speech  1. What are your last 5 BP readings? 181/81; 160/78; 191/78; 159;63; 162/58  2. Are you having any other symptoms (ex. Dizziness, headache, blurred vision, passed out)? Has pain in her leg  3. What is your BP issue?

## 2022-10-01 NOTE — Progress Notes (Signed)
[name], looks like she declined the bronch and opted for follow up imaging?   Thanks,  BLI  Jodi Igo, DO [facility] 10/01/2022 12:38 AM

## 2022-10-02 NOTE — Telephone Encounter (Signed)
Reports yesterday BP 191/81. BP this morning at 1 am was 158/84. Reports HR has been normal. Reports home BP cuff has not been checked for accuracy. BP has not been checked this morning due to being up all night due to helping daughter and is just now going to bed. Reports yesterday she had a little dizziness. Denies blurred vision. Denies headache. Denies dizziness today. Medications reviewed. Advised to regularly monitor and record blood pressure readings at home. Advised to use the same machine at the same time of day to check your readings and record them to bring to your follow-up visit. Gave nurse visit for 10/08/2022 @8 :00 am. Advised to bring home BP cuff to office so that it can be checked for accuracy. Verbalized understanding of plan.

## 2022-10-06 DIAGNOSIS — M961 Postlaminectomy syndrome, not elsewhere classified: Secondary | ICD-10-CM | POA: Diagnosis not present

## 2022-10-06 DIAGNOSIS — M5136 Other intervertebral disc degeneration, lumbar region: Secondary | ICD-10-CM | POA: Diagnosis not present

## 2022-10-06 DIAGNOSIS — Z79899 Other long term (current) drug therapy: Secondary | ICD-10-CM | POA: Diagnosis not present

## 2022-10-06 DIAGNOSIS — M25562 Pain in left knee: Secondary | ICD-10-CM | POA: Diagnosis not present

## 2022-10-06 DIAGNOSIS — Z5181 Encounter for therapeutic drug level monitoring: Secondary | ICD-10-CM | POA: Diagnosis not present

## 2022-10-06 DIAGNOSIS — Z79891 Long term (current) use of opiate analgesic: Secondary | ICD-10-CM | POA: Diagnosis not present

## 2022-10-08 ENCOUNTER — Ambulatory Visit: Payer: Medicare Other

## 2022-10-08 ENCOUNTER — Telehealth: Payer: Self-pay | Admitting: Cardiology

## 2022-10-08 ENCOUNTER — Ambulatory Visit: Payer: Medicare Other | Admitting: Family Medicine

## 2022-10-08 NOTE — Telephone Encounter (Signed)
Patient wants to reschedule her nurse visit.

## 2022-10-08 NOTE — Telephone Encounter (Signed)
Pt asked to r/s [place] d/t weather alerts- flood warnings/alerts. Pt stated she has anxiety about driving in certain weather conditions.   Pt r/s for Tuesday 8/13 @ 9 am as she stated this date/time works better for her.

## 2022-10-13 ENCOUNTER — Ambulatory Visit: Payer: Medicare Other

## 2022-10-13 ENCOUNTER — Telehealth: Payer: Self-pay | Admitting: Nurse Practitioner

## 2022-10-13 NOTE — Telephone Encounter (Signed)
   The patient mentioned that she woke up feeling unwell. She plans to obtain a blood pressure cuff to check her BP manually. She also stated that she will discuss everything at her upcoming appointment with Dr. Wyline Mood

## 2022-10-13 NOTE — Telephone Encounter (Signed)
Noted-Nurse visit scheduled for today canceled by patient.

## 2022-10-15 ENCOUNTER — Other Ambulatory Visit: Payer: Self-pay | Admitting: Nurse Practitioner

## 2022-10-19 ENCOUNTER — Telehealth: Payer: Self-pay | Admitting: Cardiology

## 2022-10-19 NOTE — Telephone Encounter (Signed)
Pt c/o medication issue:  1. Name of Medication:   dapagliflozin propanediol (FARXIGA) 10 MG TABS tablet    2. How are you currently taking this medication (dosage and times per day)? As prescribed   3. Are you having a reaction (difficulty breathing--STAT)?   4. What is your medication issue? Patient is now in donut hole for insurance and would like a call back to discuss options for this medication.

## 2022-10-19 NOTE — Telephone Encounter (Signed)
Patient calling the office for samples of medication:   1.  What medication and dosage are you requesting samples for? dapagliflozin propanediol (FARXIGA) 10 MG TABS tablet   2.  Are you currently out of this medication? 2 left

## 2022-10-20 NOTE — Telephone Encounter (Signed)
Will call patient after 8 AM [name] advised her to stop this medication after running out of last prescription and advised he to monitor BP until sept appointment with Dr.Branch    EP note regarding this medication listed below...  Called and spoke with patient per her request.  States she is in the donut hole in regards to [place].  States she is unable to afford this, and does not qualify for patient assistance program.  Told patient that we will make a note that she will stop [place] after her current medication prescription runs out.  I discussed with her to continue the rest of her medication regimen.    I recommend that at next office visit with Dr. Dina Rich in September, her blood pressure readings need to be reviewed and if blood pressure allows, increase losartan to 25 mg daily.  I believe Sherryll Burger will also not be beneficial, as cost will be an issue with this medication as well.   Pt verbalized understanding and was appreciative of my call.    Sharlene Dory, NP

## 2022-10-20 NOTE — Telephone Encounter (Signed)
Left voicemail advising patient of this encounter per patients DPR and I also sent MyChart message advising her as well. At this time thee is no need for samples due to EP stopping farxiga at end of prescription

## 2022-10-22 ENCOUNTER — Ambulatory Visit: Payer: Medicare Other | Admitting: Family Medicine

## 2022-10-28 ENCOUNTER — Encounter: Payer: Self-pay | Admitting: Family Medicine

## 2022-10-28 ENCOUNTER — Ambulatory Visit (INDEPENDENT_AMBULATORY_CARE_PROVIDER_SITE_OTHER): Payer: Medicare Other | Admitting: Family Medicine

## 2022-10-28 VITALS — BP 124/65 | HR 75 | Ht 63.0 in | Wt 146.1 lb

## 2022-10-28 DIAGNOSIS — I10 Essential (primary) hypertension: Secondary | ICD-10-CM | POA: Diagnosis not present

## 2022-10-28 DIAGNOSIS — E785 Hyperlipidemia, unspecified: Secondary | ICD-10-CM | POA: Diagnosis not present

## 2022-10-28 DIAGNOSIS — R7303 Prediabetes: Secondary | ICD-10-CM

## 2022-10-28 DIAGNOSIS — E782 Mixed hyperlipidemia: Secondary | ICD-10-CM

## 2022-10-28 DIAGNOSIS — E538 Deficiency of other specified B group vitamins: Secondary | ICD-10-CM | POA: Diagnosis not present

## 2022-10-28 DIAGNOSIS — F419 Anxiety disorder, unspecified: Secondary | ICD-10-CM

## 2022-10-28 MED ORDER — PANTOPRAZOLE SODIUM 40 MG PO TBEC: 40.0000 mg | DELAYED_RELEASE_TABLET | Freq: Every day | ORAL | 2 refills | Status: DC

## 2022-10-28 MED ORDER — ALPRAZOLAM 0.5 MG PO TABS
0.5000 mg | ORAL_TABLET | Freq: Every day | ORAL | 0 refills | Status: AC | PRN
Start: 2022-10-28 — End: ?

## 2022-10-28 NOTE — Assessment & Plan Note (Signed)
Lipid panel ordered today Advise lifestyle modifications follow diet low in saturated fat, reduce dietary salt intake, avoid fatty foods, maintain an exercise routine 3 to 5 days a week for a minimum total of 150 minutes.

## 2022-10-28 NOTE — Assessment & Plan Note (Signed)
Vitals:   10/28/22 1558  BP: 124/65   Blood pressure controlled in today's visit, Labs ordered Continued discussion on DASH diet, low sodium diet and maintain a exercise routine for 150 minutes per week.

## 2022-10-28 NOTE — Patient Instructions (Signed)

## 2022-10-28 NOTE — Progress Notes (Signed)
Patient Office Visit   Subjective   Patient ID: [name], female    DOB: 03-02-1946  Age: 77 y.o. MRN: [id number]  CC:  Chief Complaint  Patient presents with   Lung Abcess    F/u   Hypertension    Blood pressure has been high when testing at home/   Medication Refill    Refill for protonix and xanax   Back Pain    Lower back pain causing a dull achy pain in left leg. Left leg is also having swelling.     HPI [name] 77 year old female, presents to the clinic for chronic follow up  She  has a past medical history of Arthritis, CAD (coronary artery disease), CHF (congestive heart failure) (HCC), non-Hodgkin's lymphoma, Hyperlipidemia, Hypertension, and Left ventricular apical thrombus.For the details of today's visit, please refer to assessment and plan.   HPI    Outpatient Encounter Medications as of 10/28/2022  Medication Sig   aspirin EC 81 MG tablet Take 1 tablet (81 mg total) by mouth daily. Swallow whole.   atorvastatin (LIPITOR) 20 MG tablet TAKE ONE TABLET BY MOUTH EVERY DAY   bisacodyl (DULCOLAX) 10 MG suppository Place 1 suppository (10 mg total) rectally daily as needed for moderate constipation.   Calcium Carb-Cholecalciferol (OYSTER SHELL CALCIUM W/D) 500-5 MG-MCG TABS Take by mouth.   clopidogrel (PLAVIX) 75 MG tablet TAKE 1 TABLET BY MOUTH DAILY   cyanocobalamin (,VITAMIN B-12,) 1000 MCG/ML injection Inject 1 mcg into the muscle every 30 (thirty) days.   diclofenac Sodium (VOLTAREN) 1 % GEL Apply 2 g topically 4 (four) times daily as needed (pain).   DULoxetine (CYMBALTA) 30 MG capsule Take 30 mg by mouth 3 (three) times daily.   fluticasone (FLONASE) 50 MCG/ACT nasal spray Place 2 sprays into both nostrils daily as needed for allergies.   furosemide (LASIX) 40 MG tablet Take 1 tablet (40 mg total) by mouth as needed (shortness of breath or weight gain). (Patient taking differently: Take 40 mg by mouth daily as needed (shortness of breath or weight  gain).)   gabapentin (NEURONTIN) 800 MG tablet Take 800 mg by mouth 3 (three) times daily.   losartan (COZAAR) 25 MG tablet Take 0.5 tablets (12.5 mg total) by mouth daily.   nitroGLYCERIN (NITROSTAT) 0.4 MG SL tablet Place 1 tablet (0.4 mg total) under the tongue every 5 (five) minutes x 3 doses as needed for chest pain.   oxycodone (ROXICODONE) 30 MG immediate release tablet Take 30 mg by mouth 3 (three) times daily as needed for pain.   polyethylene glycol powder (GLYCOLAX/MIRALAX) 17 GM/SCOOP powder Take 1 Container by mouth once.   potassium chloride SA (KLOR-CON M) 20 MEQ tablet Take 1 tablet (20 mEq total) by mouth as needed (only on the days that you take the Lasix).   [DISCONTINUED] ALPRAZolam (XANAX) 0.5 MG tablet TAKE ONE TABLET BY MOUTH EVERY DAY AS NEEDED   [DISCONTINUED] pantoprazole (PROTONIX) 40 MG tablet Take 40 mg by mouth daily.   ALPRAZolam (XANAX) 0.5 MG tablet Take 1 tablet (0.5 mg total) by mouth daily as needed.   dapagliflozin propanediol (FARXIGA) 10 MG TABS tablet Take 1 tablet (10 mg total) by mouth daily. (Patient not taking: Reported on 10/28/2022)   pantoprazole (PROTONIX) 40 MG tablet Take 1 tablet (40 mg total) by mouth daily.   tiZANidine (ZANAFLEX) 4 MG tablet Take 4 mg by mouth 3 (three) times daily.   triamcinolone ointment (KENALOG) 0.5 % Apply  1 Application topically 2 (two) times daily.   [DISCONTINUED] pantoprazole (PROTONIX) 40 MG tablet Take 1 tablet (40 mg total) by mouth daily.   No facility-administered encounter medications on file as of 10/28/2022.    Past Surgical History:  Procedure Laterality Date   CORONARY STENT INTERVENTION N/A 08/08/2021   Procedure: CORONARY STENT INTERVENTION;  Surgeon: Tonny Bollman, MD;  Location: [facility] INVASIVE CV LAB;  Service: Cardiovascular;  Laterality: N/A;   CORONARY ULTRASOUND/IVUS N/A 08/08/2021   Procedure: Intravascular Ultrasound/IVUS;  Surgeon: Tonny Bollman, MD;  Location: [facility] INVASIVE CV LAB;  Service:  Cardiovascular;  Laterality: N/A;   LEFT HEART CATH AND CORONARY ANGIOGRAPHY N/A 08/02/2021   Procedure: LEFT HEART CATH AND CORONARY ANGIOGRAPHY;  Surgeon: Yvonne Kendall, MD;  Location: [facility] INVASIVE CV LAB;  Service: Cardiovascular;  Laterality: N/A;    Review of Systems  Constitutional:  Negative for chills and fever.  Respiratory:  Negative for shortness of breath.   Cardiovascular:  Negative for palpitations.  Gastrointestinal:  Positive for heartburn.  Neurological:  Negative for dizziness.  Psychiatric/Behavioral:  The patient has insomnia.       Objective    BP 124/65 (BP Location: Left Arm, Patient Position: Sitting, Cuff Size: Normal)   Pulse 75   Ht 5\' 3"  (1.6 m)   Wt 146 lb 1.9 oz (66.3 kg)   SpO2 94%   BMI 25.88 kg/m   Physical Exam Vitals reviewed.  Constitutional:      General: She is not in acute distress.    Appearance: Normal appearance. She is not ill-appearing, toxic-appearing or diaphoretic.  HENT:     Head: Normocephalic.  Eyes:     General:        Right eye: No discharge.        Left eye: No discharge.     Conjunctiva/sclera: Conjunctivae normal.  Cardiovascular:     Rate and Rhythm: Normal rate.     Pulses: Normal pulses.     Heart sounds: Normal heart sounds.  Pulmonary:     Effort: Pulmonary effort is normal. No respiratory distress.     Breath sounds: Normal breath sounds.  Musculoskeletal:        General: Normal range of motion.     Cervical back: Normal range of motion.  Skin:    General: Skin is warm and dry.     Capillary Refill: Capillary refill takes less than 2 seconds.  Neurological:     General: No focal deficit present.     Mental Status: She is alert and oriented to person, place, and time.     Coordination: Coordination normal.     Gait: Gait normal.  Psychiatric:        Mood and Affect: Mood normal.        Behavior: Behavior normal.       Assessment & Plan:  Primary hypertension Assessment & Plan: Vitals:    10/28/22 1558  BP: 124/65   Blood pressure controlled in today's visit, Labs ordered Continued discussion on DASH diet, low sodium diet and maintain a exercise routine for 150 minutes per week.   Orders: -     CMP14+EGFR -     CBC with Differential/Platelet  Mixed hyperlipidemia -     Lipid panel  Vitamin B12 deficiency -     B12 and Folate Panel  Prediabetes -     Hemoglobin A1c  Anxiety -     ALPRAZolam; Take 1 tablet (0.5 mg total) by mouth daily as needed.  Dispense: 30 tablet; Refill: 0  Hyperlipidemia LDL goal <70 Assessment & Plan: Lipid panel ordered today Advise lifestyle modifications follow diet low in saturated fat, reduce dietary salt intake, avoid fatty foods, maintain an exercise routine 3 to 5 days a week for a minimum total of 150 minutes.    Other orders -     Pantoprazole Sodium; Take 1 tablet (40 mg total) by mouth daily.  Dispense: 30 tablet; Refill: 2    Return in about 4 months (around 02/27/2023), or if symptoms worsen or fail to improve, for chronic follow-up.   Cruzita Lederer Newman Nip, FNP

## 2022-10-29 ENCOUNTER — Ambulatory Visit: Payer: Medicare Other | Admitting: Family Medicine

## 2022-11-11 ENCOUNTER — Ambulatory Visit (INDEPENDENT_AMBULATORY_CARE_PROVIDER_SITE_OTHER): Payer: Medicare Other

## 2022-11-11 VITALS — BP 161/83 | Ht 63.0 in | Wt 145.0 lb

## 2022-11-11 DIAGNOSIS — Z0000 Encounter for general adult medical examination without abnormal findings: Secondary | ICD-10-CM

## 2022-11-11 DIAGNOSIS — Z79899 Other long term (current) drug therapy: Secondary | ICD-10-CM

## 2022-11-11 NOTE — Progress Notes (Signed)
 Because this visit was a virtual/telehealth visit,  certain criteria was not obtained, such a blood pressure, CBG if applicable, and timed get up and go. Any medications not marked as "taking" were not mentioned during the medication reconciliation part of the visit. Any vitals not documented were not able to be obtained due to this being a telehealth visit or patient was unable to self-report a recent blood pressure reading due to a lack of equipment at home via telehealth. Vitals that have been documented are verbally provided by the patient.   Subjective:   [name] is a 77 y.o. female who presents for Medicare Annual (Subsequent) preventive examination.  Visit Complete: Virtual  I connected with  [name] on 11/11/22 by a audio enabled telemedicine application and verified that I am speaking with the correct person using two identifiers.  Patient Location: Home  Provider Location: Home Office  I discussed the limitations of evaluation and management by telemedicine. The patient expressed understanding and agreed to proceed.  Patient Medicare AWV questionnaire was completed by the patient on na; I have confirmed that all information answered by patient is correct and no changes since this date.  Review of Systems     Cardiac Risk Factors include: advanced age (>44men, >51 women);diabetes mellitus;dyslipidemia;hypertension;sedentary lifestyle;Other (see comment), Risk factor comments: history of heart disease     Objective:    Today's Vitals   11/11/22 1444 11/11/22 1451  BP: (!) 161/83   Weight: 145 lb (65.8 kg)   Height: 5\' 3"  (1.6 m)   PainSc:  7    Body mass index is 25.69 kg/m.     11/11/2022    2:44 PM 07/06/2022    1:20 PM 08/25/2021    8:17 PM 08/02/2021    6:40 PM  Advanced Directives  Does Patient Have a Medical Advance Directive? No No No No  Would patient like information on creating a medical advance directive? No - Patient declined No - Patient  declined No - Patient declined No - Patient declined    Current Medications (verified) Outpatient Encounter Medications as of 11/11/2022  Medication Sig   ALPRAZolam (XANAX) 0.5 MG tablet Take 1 tablet (0.5 mg total) by mouth daily as needed.   aspirin EC 81 MG tablet Take 1 tablet (81 mg total) by mouth daily. Swallow whole.   atorvastatin (LIPITOR) 20 MG tablet TAKE ONE TABLET BY MOUTH EVERY DAY   bisacodyl (DULCOLAX) 10 MG suppository Place 1 suppository (10 mg total) rectally daily as needed for moderate constipation.   Calcium Carb-Cholecalciferol (OYSTER SHELL CALCIUM W/D) 500-5 MG-MCG TABS Take by mouth.   clopidogrel (PLAVIX) 75 MG tablet TAKE 1 TABLET BY MOUTH DAILY   cyanocobalamin (,VITAMIN B-12,) 1000 MCG/ML injection Inject 1 mcg into the muscle every 30 (thirty) days.   diclofenac Sodium (VOLTAREN) 1 % GEL Apply 2 g topically 4 (four) times daily as needed (pain).   DULoxetine (CYMBALTA) 30 MG capsule Take 30 mg by mouth 3 (three) times daily.   fluticasone (FLONASE) 50 MCG/ACT nasal spray Place 2 sprays into both nostrils daily as needed for allergies.   furosemide (LASIX) 40 MG tablet Take 1 tablet (40 mg total) by mouth as needed (shortness of breath or weight gain). (Patient taking differently: Take 40 mg by mouth daily as needed (shortness of breath or weight gain).)   gabapentin (NEURONTIN) 800 MG tablet Take 800 mg by mouth 3 (three) times daily.   losartan (COZAAR) 25 MG tablet Take 0.5 tablets (  12.5 mg total) by mouth daily.   nitroGLYCERIN (NITROSTAT) 0.4 MG SL tablet Place 1 tablet (0.4 mg total) under the tongue every 5 (five) minutes x 3 doses as needed for chest pain.   oxycodone (ROXICODONE) 30 MG immediate release tablet Take 30 mg by mouth 3 (three) times daily as needed for pain.   pantoprazole (PROTONIX) 40 MG tablet Take 1 tablet (40 mg total) by mouth daily.   polyethylene glycol powder (GLYCOLAX/MIRALAX) 17 GM/SCOOP powder Take 1 Container by mouth once.    potassium chloride SA (KLOR-CON M) 20 MEQ tablet Take 1 tablet (20 mEq total) by mouth as needed (only on the days that you take the Lasix).   tiZANidine (ZANAFLEX) 4 MG tablet Take 4 mg by mouth 3 (three) times daily.   triamcinolone ointment (KENALOG) 0.5 % Apply 1 Application topically 2 (two) times daily.   dapagliflozin propanediol (FARXIGA) 10 MG TABS tablet Take 1 tablet (10 mg total) by mouth daily. (Patient not taking: Reported on 10/28/2022)   No facility-administered encounter medications on file as of 11/11/2022.    Allergies (verified) Morphine   History: Past Medical History:  Diagnosis Date   Arthritis    CAD (coronary artery disease)    a. cath in 07/2021 showing severe multivessel CAD --> not felt to be a CABG candidate and limited viable myocardium --> received DESx2 to LAD   CHF (congestive heart failure) (HCC)    a. EF 30-35% by echo in 07/2021, normalized to 60-65% by repeat imaging later that month.   Hx of non-Hodgkin's lymphoma    Hyperlipidemia    Hypertension    Left ventricular apical thrombus    a. noted on cMRI in 07/2021   Past Surgical History:  Procedure Laterality Date   CORONARY STENT INTERVENTION N/A 08/08/2021   Procedure: CORONARY STENT INTERVENTION;  Surgeon: Tonny Bollman, MD;  Location: [facility] INVASIVE CV LAB;  Service: Cardiovascular;  Laterality: N/A;   CORONARY ULTRASOUND/IVUS N/A 08/08/2021   Procedure: Intravascular Ultrasound/IVUS;  Surgeon: Tonny Bollman, MD;  Location: [facility] INVASIVE CV LAB;  Service: Cardiovascular;  Laterality: N/A;   LEFT HEART CATH AND CORONARY ANGIOGRAPHY N/A 08/02/2021   Procedure: LEFT HEART CATH AND CORONARY ANGIOGRAPHY;  Surgeon: Yvonne Kendall, MD;  Location: [facility] INVASIVE CV LAB;  Service: Cardiovascular;  Laterality: N/A;   Family History  Problem Relation Age of Onset   Aortic aneurysm Mother    Coronary artery disease Brother    Social History   Socioeconomic History   Marital status: Widowed    Spouse  name: Not on file   Number of children: Not on file   Years of education: Not on file   Highest education level: Not on file  Occupational History   Not on file  Tobacco Use   Smoking status: Former    Current packs/day: 0.00    Types: Cigarettes    Quit date: 07/03/2022    Years since quitting: 0.3    Passive exposure: Never   Smokeless tobacco: Never   Tobacco comments:    Quit 07/03/2022 07/08/22 Tay  Vaping Use   Vaping status: Never Used  Substance and Sexual Activity   Alcohol use: Not Currently   Drug use: Never   Sexual activity: Not on file  Other Topics Concern   Not on file  Social History Narrative   Not on file   Social Determinants of Health   Financial Resource Strain: Low Risk  (11/11/2022)   Overall Financial Resource Strain (CARDIA)  Difficulty of Paying Living Expenses: Not hard at all  Food Insecurity: No Food Insecurity (11/11/2022)   Hunger Vital Sign    Worried About Running Out of Food in the Last Year: Never true    Ran Out of Food in the Last Year: Never true  Transportation Needs: No Transportation Needs (11/11/2022)   PRAPARE - Administrator, Civil Service (Medical): No    Lack of Transportation (Non-Medical): No  Physical Activity: Inactive (11/11/2022)   Exercise Vital Sign    Days of Exercise per Week: 0 days    Minutes of Exercise per Session: 0 min  Stress: Stress Concern Present (11/11/2022)   [employer] of Occupational Health - Occupational Stress Questionnaire    Feeling of Stress : Rather much  Social Connections: Moderately Integrated (11/11/2022)   Social Connection and Isolation Panel [NHANES]    Frequency of Communication with Friends and Family: More than three times a week    Frequency of Social Gatherings with Friends and Family: More than three times a week    Attends Religious Services: More than 4 times per year    Active Member of [employer] or Organizations: Yes    Attends Banker Meetings: More  than 4 times per year    Marital Status: Widowed    Tobacco Counseling Counseling given: No Tobacco comments: Quit 07/03/2022 07/08/22 Tay   Clinical Intake:  Pre-visit preparation completed: Yes  Pain : 0-10 Pain Score: 7  Pain Type: Chronic pain Pain Location: Back Pain Orientation: Lower Pain Descriptors / Indicators: Aching, Constant Pain Frequency: Constant     BMI - recorded: 25.69 Nutritional Status: BMI 25 -29 Overweight Nutritional Risks: None Diabetes: Yes CBG done?: No (telehealth visit. unable to obtain cbg) Did pt. bring in CBG monitor from home?: No  How often do you need to have someone help you when you read instructions, pamphlets, or other written materials from your doctor or pharmacy?: 1 - Never  Interpreter Needed?: No  Information entered by ::  Hollace Michelli, CMA   Activities of Daily Living    11/11/2022    3:03 PM  In your present state of health, do you have any difficulty performing the following activities:  Hearing? 0  Vision? 0  Difficulty concentrating or making decisions? 0  Walking or climbing stairs? 1  Comment has an abnormal gait and chronic low back pain  Dressing or bathing? 0  Doing errands, shopping? 0  Preparing Food and eating ? N  Using the Toilet? N  In the past six months, have you accidently leaked urine? N  Do you have problems with loss of bowel control? N  Managing your Medications? N  Managing your Finances? N  Housekeeping or managing your Housekeeping? Y  Comment has trouble vacuuming and sweeping due to chronic low back pain    Patient Care Team: Del Newman Nip, Tenna Child, FNP as PCP - General (Family Medicine) Wyline Mood, Dorothe Pea, MD as PCP - Cardiology (Cardiology) Doreatha Massed, MD as Medical Oncologist (Medical Oncology)  Indicate any recent Medical Services you may have received from other than [facility] providers in the past year (date may be approximate).     Assessment:   This is a routine  wellness examination for [place].  Hearing/Vision screen Hearing Screening - Comments:: Patient denies any hearing difficulties.   Vision Screening - Comments:: Wears rx glasses - up to date with routine eye exams. She has had her eye exam with [facility] in [place]  Goals Addressed             This Visit's Progress    Patient Stated       To get my weight under control       Depression Screen    11/11/2022    2:59 PM 10/28/2022    4:02 PM 07/06/2022    1:15 PM 06/29/2022    2:57 PM 06/04/2022    4:00 PM  PHQ 2/9 Scores  PHQ - 2 Score 0 2 2 2 2   PHQ- 9 Score 0 8 9 7 7     Fall Risk    11/11/2022    3:02 PM 10/28/2022    4:03 PM 06/04/2022    4:00 PM  Fall Risk   Falls in the past year? 0 0 0  Number falls in past yr: 0  0  Injury with Fall? 0  0  Risk for fall due to : No Fall Risks  No Fall Risks  Follow up Falls prevention discussed  Falls evaluation completed    MEDICARE RISK AT HOME: Medicare Risk at Home Any stairs in or around the home?: No If so, are there any without handrails?: No Home free of loose throw rugs in walkways, pet beds, electrical cords, etc?: Yes Adequate lighting in your home to reduce risk of falls?: Yes Life alert?: No Use of a cane, walker or w/c?: No Grab bars in the bathroom?: No Shower chair or bench in shower?: No Elevated toilet seat or a handicapped toilet?: No  TIMED UP AND GO:  Was the test performed?  No    Cognitive Function:        11/11/2022    2:59 PM  6CIT Screen  What Year? 0 points  What month? 0 points  What time? 0 points  Count back from 20 0 points  Months in reverse 0 points  Repeat phrase 0 points  Total Score 0 points    Immunizations Immunization History  Administered Date(s) Administered   Influenza Inj Mdck Quad Pf 01/09/2022   Influenza, High Dose Seasonal PF 01/01/2015, 12/03/2015, 12/30/2016, 12/20/2017, 12/21/2018, 01/23/2020, 01/20/2021   Influenza,inj,quad, With Preservative 11/30/2016    Influenza-Unspecified 01/13/2011, 01/13/2011, 11/30/2016, 01/20/2021   Measles / Rubella 09/21/1989   PFIZER Comirnaty(Gray Top)Covid-19 Tri-Sucrose Vaccine 11/24/2019, 08/01/2020   PFIZER(Purple Top)SARS-COV-2 Vaccination 06/08/2019, 06/29/2019   Pneumococcal Conjugate-13 05/04/2016   Pneumococcal Polysaccharide-23 01/06/1995, 05/09/2014, 01/23/2020   Respiratory Syncytial Virus Vaccine,Recomb Aduvanted(Arexvy) 12/18/2021   Td (Adult),5 Lf Tetanus Toxid, Preservative Free 07/14/1988   Tdap 01/30/2015   Zoster Recombinant(Shingrix) 03/21/2018, 12/18/2021   Zoster, Live 05/05/2011    TDAP status: Up to date  Flu Vaccine status: Due, Education has been provided regarding the importance of this vaccine. Advised may receive this vaccine at local pharmacy or Health Dept. Aware to provide a copy of the vaccination record if obtained from local pharmacy or Health Dept. Verbalized acceptance and understanding.  Pneumococcal vaccine status: Up to date  Covid-19 vaccine status: Information provided on how to obtain vaccines.   Qualifies for Shingles Vaccine? No   Zostavax completed Yes   Shingrix Completed?: Yes  Screening Tests Health Maintenance  Topic Date Due   Medicare Annual Wellness (AWV)  01/20/2022   INFLUENZA VACCINE  10/01/2022   COVID-19 Vaccine (5 - 2023-24 season) 11/01/2022   DTaP/Tdap/Td (2 - Td or Tdap) 01/29/2025   Pneumonia Vaccine 51+ Years old  Completed   DEXA SCAN  Completed   Hepatitis C Screening  Completed  Zoster Vaccines- Shingrix  Completed   HPV VACCINES  Aged Out    Health Maintenance  Health Maintenance Due  Topic Date Due   Medicare Annual Wellness (AWV)  01/20/2022   INFLUENZA VACCINE  10/01/2022   COVID-19 Vaccine (5 - 2023-24 season) 11/01/2022    Colorectal cancer screening: Referral to GI placed 11/11/2022. Pt aware the office will call re: appt.  Mammogram status: Ordered 11/11/2022. Pt provided with contact info and advised to call to  schedule appt.   Bone Density Status: patient has had scanning done however I can't see results. Patient declines another one.   Lung Cancer Screening: (Low Dose CT Chest recommended if Age 82-80 years, 20 pack-year currently smoking OR have quit w/in 15years.) does not qualify.   Lung Cancer Screening Referral: na  Additional Screening:  Hepatitis C Screening: does not qualify; Completed 05/18/2022  Vision Screening: Recommended annual ophthalmology exams for early detection of glaucoma and other disorders of the eye. Is the patient up to date with their annual eye exam?  Yes  Who is the provider or what is the name of the office in which the patient attends annual eye exams? [facility] [place] If pt is not established with a provider, would they like to be referred to a provider to establish care? No .   Dental Screening: Recommended annual dental exams for proper oral hygiene  Diabetic Foot Exam: n/a  Community Resource Referral / Chronic Care Management: CRR required this visit?  Yes   CCM required this visit?  No     Plan:     I have personally reviewed and noted the following in the patient's chart:   Medical and social history Use of alcohol, tobacco or illicit drugs  Current medications and supplements including opioid prescriptions. Patient is currently taking opioid prescriptions. Information provided to patient regarding non-opioid alternatives. Patient advised to discuss non-opioid treatment plan with their provider. Functional ability and status Nutritional status Physical activity Advanced directives List of other physicians Hospitalizations, surgeries, and ER visits in previous 12 months Vitals Screenings to include cognitive, depression, and falls Referrals and appointments  In addition, I have reviewed and discussed with patient certain preventive protocols, quality metrics, and best practice recommendations. A written personalized care plan for  preventive services as well as general preventive health recommendations were provided to patient.     Jordan Hawks Benjermin Korber, CMA   11/11/2022   After Visit Summary: (Mail) Due to this being a telephonic visit, the after visit summary with patients personalized plan was offered to patient via mail   Nurse Notes:

## 2022-11-11 NOTE — Patient Instructions (Addendum)
[name] , Thank you for taking time to come for your Medicare Wellness Visit. I appreciate your ongoing commitment to your health goals. Please review the following plan we discussed and let me know if I can assist you in the future.   Referrals/Orders/Follow-Ups/Clinician Recommendations:  You have an order for:  []   2D Mammogram  [x]   3D Mammogram  []   Bone Density     Please call for appointment:   [facility] [address], [place] [address] [phone]  Make sure to wear two-piece clothing.  No lotions powders or deodorants the day of the appointment Make sure to bring picture ID and insurance card.  Bring list of medications you are currently taking including any supplements.   Schedule your [facility] screening mammogram through MyChart!   Log into your MyChart account.  Go to 'Visit' (or 'Appointments' if on mobile App) --> Schedule an Appointment  Under 'Select a Reason for Visit' choose the Mammogram Screening option.  Complete the pre-visit questions and select the time and place that best fits your schedule.  You have been referred to [facility].If you haven't heard from them within the next week, please call them to schedule your appointment.    Address: [address], [place], [place] [address] Phone: [phone]   This is a list of the screening recommended for you and due dates:  Health Maintenance  Topic Date Due   Flu Shot  10/01/2022   COVID-19 Vaccine (5 - 2023-24 season) 11/01/2022   Medicare Annual Wellness Visit  11/11/2023   DTaP/Tdap/Td vaccine (2 - Td or Tdap) 01/29/2025   Pneumonia Vaccine  Completed   DEXA scan (bone density measurement)  Completed   Hepatitis C Screening  Completed   Zoster (Shingles) Vaccine  Completed   HPV Vaccine  Aged Out    Advanced directives: (ACP Link)Information on Advanced Care Planning can be found at [facility] [place] [facility]  Directives Advance Health Care Directives ([email or web address]/)   Next Medicare Annual Wellness Visit scheduled for next year: Yes  Preventive Care 65 Years and Older, Female Preventive care refers to lifestyle choices and visits with your health care provider that can promote health and wellness. Preventive care visits are also called wellness exams. What can I expect for my preventive care visit? Counseling Your health care provider may ask you questions about your: Medical history, including: Past medical problems. Family medical history. Pregnancy and menstrual history. History of falls. Current health, including: Memory and ability to understand (cognition). Emotional well-being. Home life and relationship well-being. Sexual activity and sexual health. Lifestyle, including: Alcohol, nicotine or tobacco, and drug use. Access to firearms. Diet, exercise, and sleep habits. Work and work Astronomer. Sunscreen use. Safety issues such as seatbelt and bike helmet use. Physical exam Your health care provider will check your: Height and weight. These may be used to calculate your BMI (body mass index). BMI is a measurement that tells if you are at a healthy weight. Waist circumference. This measures the distance around your waistline. This measurement also tells if you are at a healthy weight and may help predict your risk of certain diseases, such as type 2 diabetes and high blood pressure. Heart rate and blood pressure. Body temperature. Skin for abnormal spots. What immunizations do I need?  Vaccines are usually given at various ages, according to a schedule. Your health care provider will recommend vaccines for you based on your age, medical history,  and lifestyle or other factors, such as travel or where you work. What tests do I need? Screening Your health care provider may recommend screening tests for certain conditions. This may include: Lipid and cholesterol levels. Hepatitis C  test. Hepatitis B test. HIV (human immunodeficiency virus) test. STI (sexually transmitted infection) testing, if you are at risk. Lung cancer screening. Colorectal cancer screening. Diabetes screening. This is done by checking your blood sugar (glucose) after you have not eaten for a while (fasting). Mammogram. Talk with your health care provider about how often you should have regular mammograms. BRCA-related cancer screening. This may be done if you have a family history of breast, ovarian, tubal, or peritoneal cancers. Bone density scan. This is done to screen for osteoporosis. Talk with your health care provider about your test results, treatment options, and if necessary, the need for more tests. Follow these instructions at home: Eating and drinking  Eat a diet that includes fresh fruits and vegetables, whole grains, lean protein, and low-fat dairy products. Limit your intake of foods with high amounts of sugar, saturated fats, and salt. Take vitamin and mineral supplements as recommended by your health care provider. Do not drink alcohol if your health care provider tells you not to drink. If you drink alcohol: Limit how much you have to 0-1 drink a day. Know how much alcohol is in your drink. In the [place] one drink equals one 12 oz bottle of beer (355 mL), one 5 oz glass of wine (148 mL), or one 1 oz glass of hard liquor (44 mL). Lifestyle Brush your teeth every morning and night with fluoride toothpaste. Floss one time each day. Exercise for at least 30 minutes 5 or more days each week. Do not use any products that contain nicotine or tobacco. These products include cigarettes, chewing tobacco, and vaping devices, such as e-cigarettes. If you need help quitting, ask your health care provider. Do not use drugs. If you are sexually active, practice safe sex. Use a condom or other form of protection in order to prevent STIs. Take aspirin only as told by your health care provider.  Make sure that you understand how much to take and what form to take. Work with your health care provider to find out whether it is safe and beneficial for you to take aspirin daily. Ask your health care provider if you need to take a cholesterol-lowering medicine (statin). Find healthy ways to manage stress, such as: Meditation, yoga, or listening to music. Journaling. Talking to a trusted person. Spending time with friends and family. Minimize exposure to UV radiation to reduce your risk of skin cancer. Safety Always wear your seat belt while driving or riding in a vehicle. Do not drive: If you have been drinking alcohol. Do not ride with someone who has been drinking. When you are tired or distracted. While texting. If you have been using any mind-altering substances or drugs. Wear a helmet and other protective equipment during sports activities. If you have firearms in your house, make sure you follow all gun safety procedures. What's next? Visit your health care provider once a year for an annual wellness visit. Ask your health care provider how often you should have your eyes and teeth checked. Stay up to date on all vaccines. This information is not intended to replace advice given to you by your health care provider. Make sure you discuss any questions you have with your health care provider. Document Revised: 08/14/2020 Document Reviewed: 08/14/2020 Elsevier Patient  Education  2024 [employer]. Understanding Your Risk for Falls Millions of people have serious injuries from falls each year. It is important to understand your risk of falling. Talk with your health care provider about your risk and what you can do to lower it. If you do have a serious fall, make sure to tell your provider. Falling once raises your risk of falling again. How can falls affect me? Serious injuries from falls are common. These include: Broken bones, such as hip fractures. Head injuries, such as  traumatic brain injuries (TBI) or concussions. A fear of falling can cause you to avoid activities and stay at home. This can make your muscles weaker and raise your risk for a fall. What can increase my risk? There are a number of risk factors that increase your risk for falling. The more risk factors you have, the higher your risk of falling. Serious injuries from a fall happen most often to people who are older than 78 years old. Teenagers and young adults ages 12-29 are also at higher risk. Common risk factors include: Weakness in the lower body. Being generally weak or confused due to long-term (chronic) illness. Dizziness or balance problems. Poor vision. Medicines that cause dizziness or drowsiness. These may include: Medicines for your blood pressure, heart, anxiety, insomnia, or swelling (edema). Pain medicines. Muscle relaxants. Other risk factors include: Drinking alcohol. Having had a fall in the past. Having foot pain or wearing improper footwear. Working at a dangerous job. Having any of the following in your home: Tripping hazards, such as floor clutter or loose rugs. Poor lighting. Pets. Having dementia or memory loss. What actions can I take to lower my risk of falling?     Physical activity Stay physically fit. Do strength and balance exercises. Consider taking a regular class to build strength and balance. Yoga and tai chi are good options. Vision Have your eyes checked every year and your prescription for glasses or contacts updated as needed. Shoes and walking aids Wear non-skid shoes. Wear shoes that have rubber soles and low heels. Do not wear high heels. Do not walk around the house in socks or slippers. Use a cane or walker as told by your provider. Home safety Attach secure railings on both sides of your stairs. Install grab bars for your bathtub, shower, and toilet. Use a non-skid mat in your bathtub or shower. Attach bath mats securely with  double-sided, non-slip rug tape. Use good lighting in all rooms. Keep a flashlight near your bed. Make sure there is a clear path from your bed to the bathroom. Use night-lights. Do not use throw rugs. Make sure all carpeting is taped or tacked down securely. Remove all clutter from walkways and stairways, including extension cords. Repair uneven or broken steps and floors. Avoid walking on icy or slippery surfaces. Walk on the grass instead of on icy or slick sidewalks. Use ice melter to get rid of ice on walkways in the winter. Use a cordless phone. Questions to ask your health care provider Can you help me check my risk for a fall? Do any of my medicines make me more likely to fall? Should I take a vitamin D supplement? What exercises can I do to improve my strength and balance? Should I make an appointment to have my vision checked? Do I need a bone density test to check for weak bones (osteoporosis)? Would it help to use a cane or a walker? Where to find more information Centers for  Disease Control and Prevention, STEAD[employer]: TonerPromos.no Community-Based Fall Prevention Programs: TonerPromos.no [employer] on Aging: BaseRingTones.pl Contact a health care provider if: You fall at home. You are afraid of falling at home. You feel weak, drowsy, or dizzy. This information is not intended to replace advice given to you by your health care provider. Make sure you discuss any questions you have with your health care provider. Document Revised: 10/20/2021 Document Reviewed: 10/20/2021 Elsevier Patient Education  2024 Elsevier Inc. Managing Pain Without Opioids Opioids are strong medicines used to treat moderate to severe pain. For some people, especially those who have long-term (chronic) pain, opioids may not be the best choice for pain management due to: Side effects like nausea, constipation, and sleepiness. The risk of addiction (opioid use disorder). The longer you take opioids, the greater your  risk of addiction. Pain that lasts for more than 3 months is called chronic pain. Managing chronic pain usually requires more than one approach and is often provided by a team of health care providers working together (multidisciplinary approach). Pain management may be done at a pain management center or pain clinic. How to manage pain without the use of opioids Use non-opioid medicines Non-opioid medicines for pain may include: Over-the-counter or prescription non-steroidal anti-inflammatory drugs (NSAIDs). These may be the first medicines used for pain. They work well for muscle and bone pain, and they reduce swelling. Acetaminophen. This over-the-counter medicine may work well for milder pain but not swelling. Antidepressants. These may be used to treat chronic pain. A certain type of antidepressant (tricyclics) is often used. These medicines are given in lower doses for pain than when used for depression. Anticonvulsants. These are usually used to treat seizures but may also reduce nerve (neuropathic) pain. Muscle relaxants. These relieve pain caused by sudden muscle tightening (spasms). You may also use a pain medicine that is applied to the skin as a patch, cream, or gel (topical analgesic), such as a numbing medicine. These may cause fewer side effects than medicines taken by mouth. Do certain therapies as directed Some therapies can help with pain management. They include: Physical therapy. You will do exercises to gain strength and flexibility. A physical therapist may teach you exercises to move and stretch parts of your body that are weak, stiff, or painful. You can learn these exercises at physical therapy visits and practice them at home. Physical therapy may also involve: Massage. Heat wraps or applying heat or cold to affected areas. Electrical signals that interrupt pain signals (transcutaneous electrical nerve stimulation, TENS). Weak lasers that reduce pain and swelling (low-level  laser therapy). Signals from your body that help you learn to regulate pain (biofeedback). Occupational therapy. This helps you to learn ways to function at home and work with less pain. Recreational therapy. This involves trying new activities or hobbies, such as a physical activity or drawing. Mental health therapy, including: Cognitive behavioral therapy (CBT). This helps you learn coping skills for dealing with pain. Acceptance and commitment therapy (ACT) to change the way you think and react to pain. Relaxation therapies, including muscle relaxation exercises and mindfulness-based stress reduction. Pain management counseling. This may be individual, family, or group counseling.  Receive medical treatments Medical treatments for pain management include: Nerve block injections. These may include a pain blocker and anti-inflammatory medicines. You may have injections: Near the spine to relieve chronic back or neck pain. Into joints to relieve back or joint pain. Into nerve areas that supply a painful area to relieve body pain.  Into muscles (trigger point injections) to relieve some painful muscle conditions. A medical device placed near your spine to help block pain signals and relieve nerve pain or chronic back pain (spinal cord stimulation device). Acupuncture. Follow these instructions at home Medicines Take over-the-counter and prescription medicines only as told by your health care provider. If you are taking pain medicine, ask your health care providers about possible side effects to watch out for. Do not drive or use heavy machinery while taking prescription opioid pain medicine. Lifestyle  Do not use drugs or alcohol to reduce pain. If you drink alcohol, limit how much you have to: 0-1 drink a day for women who are not pregnant. 0-2 drinks a day for men. Know how much alcohol is in a drink. In the [place] one drink equals one 12 oz bottle of beer (355 mL), one 5 oz glass of wine  (148 mL), or one 1 oz glass of hard liquor (44 mL). Do not use any products that contain nicotine or tobacco. These products include cigarettes, chewing tobacco, and vaping devices, such as e-cigarettes. If you need help quitting, ask your health care provider. Eat a healthy diet and maintain a healthy weight. Poor diet and excess weight may make pain worse. Eat foods that are high in fiber. These include fresh fruits and vegetables, whole grains, and beans. Limit foods that are high in fat and processed sugars, such as fried and sweet foods. Exercise regularly. Exercise lowers stress and may help relieve pain. Ask your health care provider what activities and exercises are safe for you. If your health care provider approves, join an exercise class that combines movement and stress reduction. Examples include yoga and tai chi. Get enough sleep. Lack of sleep may make pain worse. Lower stress as much as possible. Practice stress reduction techniques as told by your therapist. General instructions Work with all your pain management providers to find the treatments that work best for you. You are an important member of your pain management team. There are many things you can do to reduce pain on your own. Consider joining an online or in-person support group for people who have chronic pain. Keep all follow-up visits. This is important. Where to find more information You can find more information about managing pain without opioids from: American Academy of Pain Medicine: painmed.org Institute for Chronic Pain: instituteforchronicpain.org American Chronic Pain Association: theacpa.org Contact a health care provider if: You have side effects from pain medicine. Your pain gets worse or does not get better with treatments or home therapy. You are struggling with anxiety or depression. Summary Many types of pain can be managed without opioids. Chronic pain may respond better to pain management  without opioids. Pain is best managed when you and a team of health care providers work together. Pain management without opioids may include non-opioid medicines, medical treatments, physical therapy, mental health therapy, and lifestyle changes. Tell your health care providers if your pain gets worse or is not being managed well enough. This information is not intended to replace advice given to you by your health care provider. Make sure you discuss any questions you have with your health care provider. Document Revised: 05/29/2020 Document Reviewed: 05/29/2020 Elsevier Patient Education  2024 [employer].

## 2022-11-12 ENCOUNTER — Telehealth: Payer: Self-pay

## 2022-11-12 ENCOUNTER — Telehealth: Payer: Self-pay | Admitting: Family Medicine

## 2022-11-12 NOTE — Telephone Encounter (Signed)
Patient called in regard to labs.  Patient had recent [facility] draw at [facility] in [place]  Wants to know if ir is necessary to have labs done again for pcp. Wants a callback in regard

## 2022-11-12 NOTE — Progress Notes (Signed)
Care Guide Note  11/12/2022 Name: [name] MRN: [id number] DOB: [birth year]  Referred by: Rica Records, FNP Reason for referral : Care Coordination (Outreach to schedule with pharm d )   [name] is a 77 y.o. year old female who is a primary care patient of Del Nigel Berthold, FNP. [name] was referred to the pharmacist for assistance related to HTN and HLD.    An unsuccessful telephone outreach was attempted today to contact the patient who was referred to the pharmacy team for assistance with medication assistance. Additional attempts will be made to contact the patient.   Penne Lash, RMA Care Guide [facility]  [place], [place] [address] Direct Dial: [phone] [email or web address]

## 2022-11-13 NOTE — Telephone Encounter (Signed)
lmtrc

## 2022-11-18 NOTE — Progress Notes (Signed)
Care Guide Note  11/18/2022 Name: DAVIDA MANRY MRN: [id number] DOB: 08/07/1945  Referred by: Rica Records, FNP Reason for referral : Care Coordination (Outreach to schedule with pharm d )   MARGEL TALAMANTES is a 77 y.o. year old female who is a primary care patient of Del Nigel Berthold, FNP. CORNELLA RUFFER was referred to the pharmacist for assistance related to HTN and HLD.    A second unsuccessful telephone outreach was attempted today to contact the patient who was referred to the pharmacy team for assistance with medication assistance. Additional attempts will be made to contact the patient.  Penne Lash, RMA Care Guide [facility]  [place], [place] [address] Direct Dial: [phone] [email or web address]

## 2022-11-19 ENCOUNTER — Encounter: Payer: Self-pay | Admitting: Cardiology

## 2022-11-19 ENCOUNTER — Ambulatory Visit: Payer: Medicare Other | Attending: Cardiology | Admitting: Cardiology

## 2022-11-19 VITALS — BP 111/56 | HR 82 | Ht 63.5 in | Wt 149.2 lb

## 2022-11-19 DIAGNOSIS — I2510 Atherosclerotic heart disease of native coronary artery without angina pectoris: Secondary | ICD-10-CM

## 2022-11-19 DIAGNOSIS — I10 Essential (primary) hypertension: Secondary | ICD-10-CM

## 2022-11-19 DIAGNOSIS — E782 Mixed hyperlipidemia: Secondary | ICD-10-CM | POA: Diagnosis not present

## 2022-11-19 MED ORDER — ATORVASTATIN CALCIUM 40 MG PO TABS: 40.0000 mg | ORAL_TABLET | Freq: Every day | ORAL | 6 refills | Status: DC

## 2022-11-19 NOTE — Patient Instructions (Signed)
Medication Instructions:   Stop Aspirin  Increase Atorvastatin to 40mg  daily Continue all other medications.     Labwork:  none  Testing/Procedures:  none  Follow-Up:  6 months   Any Other Special Instructions Will Be Listed Below (If Applicable).   If you need a refill on your cardiac medications before your next appointment, please call your pharmacy.

## 2022-11-19 NOTE — Progress Notes (Signed)
Clinical Summary Ms. Reum is a 77 y.o.female  1.CAD -admit 07/2021 with late presenting STEMI received DES xt to LAD, residual disease managed medically. If recurrent symptoms could consider intervention  - no recent chest pains. Mild infrequent SOB   2. 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   - farxiga is too expensive, she has stopped taking - has lasix 40mg  prn, takes about once a month   4. HLD 08/2022 TC 174 TG 47 HDL 70 LDL 95  5. HTN - after MI issues with low bp's. Her coreg, entersto, aldactone, chlorthalidone - home bp's variable, had a day of SBP 170 but otherwise 100-130s. Several clinical bp's reviewed over last several months and always 100s-130s sbp   Past Medical History:  Diagnosis Date   Arthritis    CAD (coronary artery disease)    a. cath in 07/2021 showing severe multivessel CAD --> not felt to be a CABG candidate and limited viable myocardium --> received DESx2 to LAD   CHF (congestive heart failure) (HCC)    a. EF 30-35% by echo in 07/2021, normalized to 60-65% by repeat imaging later that month.   Hx of non-Hodgkin's lymphoma    Hyperlipidemia    Hypertension    Left ventricular apical thrombus    a. noted on cMRI in 07/2021     Allergies  Allergen Reactions   Morphine Rash     Current Outpatient Medications  Medication Sig Dispense Refill   ALPRAZolam (XANAX) 0.5 MG tablet Take 1 tablet (0.5 mg total) by mouth daily as needed. 30 tablet 0   aspirin EC 81 MG tablet Take 1 tablet (81 mg total) by mouth daily. Swallow whole. 90 tablet 3   atorvastatin (LIPITOR) 20 MG tablet TAKE ONE TABLET BY MOUTH EVERY DAY 90 tablet 2   bisacodyl (DULCOLAX) 10 MG suppository  Place 1 suppository (10 mg total) rectally daily as needed for moderate constipation. 12 suppository 0   Calcium Carb-Cholecalciferol (OYSTER SHELL CALCIUM W/D) 500-5 MG-MCG TABS Take by mouth.     clopidogrel (PLAVIX) 75 MG tablet TAKE 1 TABLET BY MOUTH DAILY 90 tablet 3   cyanocobalamin (,VITAMIN B-12,) 1000 MCG/ML injection Inject 1 mcg into the muscle every 30 (thirty) days.     dapagliflozin propanediol (FARXIGA) 10 MG TABS tablet Take 1 tablet (10 mg total) by mouth daily. (Patient not taking: Reported on 10/28/2022) 30 tablet 5   diclofenac Sodium (VOLTAREN) 1 % GEL Apply 2 g topically 4 (four) times daily as needed (pain).     DULoxetine (CYMBALTA) 30 MG capsule Take 30 mg by mouth 3 (three) times daily.     fluticasone (FLONASE) 50 MCG/ACT nasal spray Place 2 sprays into both nostrils daily as needed for allergies.     furosemide (LASIX) 40 MG tablet Take 1 tablet (40 mg total) by mouth as needed (shortness of breath or weight gain). (Patient taking differently: Take 40 mg by mouth daily as needed (shortness of breath or weight gain).) 30 tablet 3   gabapentin (NEURONTIN) 800 MG tablet Take 800 mg by mouth 3 (three) times daily.     losartan (COZAAR) 25 MG tablet Take 0.5 tablets (12.5 mg total) by mouth daily. 45 tablet  1   nitroGLYCERIN (NITROSTAT) 0.4 MG SL tablet Place 1 tablet (0.4 mg total) under the tongue every 5 (five) minutes x 3 doses as needed for chest pain. 25 tablet 12   oxycodone (ROXICODONE) 30 MG immediate release tablet Take 30 mg by mouth 3 (three) times daily as needed for pain.     pantoprazole (PROTONIX) 40 MG tablet Take 1 tablet (40 mg total) by mouth daily. 30 tablet 2   polyethylene glycol powder (GLYCOLAX/MIRALAX) 17 GM/SCOOP powder Take 1 Container by mouth once.     potassium chloride SA (KLOR-CON M) 20 MEQ tablet Take 1 tablet (20 mEq total) by mouth as needed (only on the days that you take the Lasix). 30 tablet 3   tiZANidine (ZANAFLEX) 4 MG tablet Take 4 mg  by mouth 3 (three) times daily.     triamcinolone ointment (KENALOG) 0.5 % Apply 1 Application topically 2 (two) times daily. 30 g 0   No current facility-administered medications for this visit.     Past Surgical History:  Procedure Laterality Date   CORONARY STENT INTERVENTION N/A 08/08/2021   Procedure: CORONARY STENT INTERVENTION;  Surgeon: Tonny Bollman, MD;  [facility] INVASIVE CV LAB;  Service: Cardiovascular;  Laterality: N/A;   CORONARY ULTRASOUND/IVUS N/A 08/08/2021   Procedure: Intravascular Ultrasound/IVUS;  Surgeon: Tonny Bollman, MD;  Location: [facility] INVASIVE CV LAB;  Service: Cardiovascular;  Laterality: N/A;   LEFT HEART CATH AND CORONARY ANGIOGRAPHY N/A 08/02/2021   Procedure: LEFT HEART CATH AND CORONARY ANGIOGRAPHY;  Surgeon: Yvonne Kendall, MD;  Location: [facility] INVASIVE CV LAB;  Service: Cardiovascular;  Laterality: N/A;     Allergies  Allergen Reactions   Morphine Rash      Family History  Problem Relation Age of Onset   Aortic aneurysm Mother    Coronary artery disease Brother      Social History Ms. Nakagawa reports that she quit smoking about 4 months ago. Her smoking use included cigarettes. She has never been exposed to tobacco smoke. She has never used smokeless tobacco. Ms. Ditmore reports that she does not currently use alcohol.   Review of Systems CONSTITUTIONAL: No weight loss, fever, chills, weakness or fatigue.  HEENT: Eyes: No visual loss, blurred vision, double vision or yellow sclerae.No hearing loss, sneezing, congestion, runny nose or sore throat.  SKIN: No rash or itching.  CARDIOVASCULAR: per hpi RESPIRATORY: No shortness of breath, cough or sputum.  GASTROINTESTINAL: No anorexia, nausea, vomiting or diarrhea. No abdominal pain or blood.  GENITOURINARY: No burning on urination, no polyuria NEUROLOGICAL: No headache, dizziness, syncope, paralysis, ataxia, numbness or tingling in the extremities. No change in bowel or bladder control.   MUSCULOSKELETAL: No muscle, back pain, joint pain or stiffness.  LYMPHATICS: No enlarged nodes. No history of splenectomy.  PSYCHIATRIC: No history of depression or anxiety.  ENDOCRINOLOGIC: No reports of sweating, cold or heat intolerance. No polyuria or polydipsia.  Marland Kitchen   Physical Examination Today's Vitals   11/19/22 1505 11/19/22 1520  BP: 100/60 (!) 111/56  Pulse: 76 82  SpO2: 98%   Weight: 149 lb 3.2 oz (67.7 kg)   Height: 5' 3.5" (1.613 m)    Body mass index is 26.02 kg/m.  Gen: resting comfortably, no acute distress HEENT: no scleral icterus, pupils equal round and reactive, no palptable cervical adenopathy,  CV: RRR, no m/rg, no jvd Resp: Clear to auscultation bilaterally GI: abdomen is soft, non-tender, non-distended, normal bowel sounds, no hepatosplenomegaly MSK: extremities are warm, no edema.  Skin: warm,  no rash Neuro:  no focal deficits Psych: appropriate affect       Assessment and Plan  1.CAD No recent symptoms - over 1 year since stent. Since long area of stenting the proximal LAD will favor plavix monotherapy as opposed to ASA lone, we will d/c asa and continue plavix EKG today SR, chronic ST/T changes  2. HTN - at goal, rare high home bp's but clinic bp's are primarily at goal. Her bp today is low normal, would not titrate meds  3. HLD  LDL above goal, increase atorvastatin 40mg  daily       Antoine Poche, M.D.

## 2022-11-20 NOTE — Progress Notes (Signed)
Care Guide Note  11/20/2022 Name: CHADSITY SOWDER MRN: [id number] DOB: 03-24-1945  Referred by: Rica Records, FNP Reason for referral : Care Coordination (Outreach to schedule with pharm d )   DONIECE AZURDIA is a 77 y.o. year old female who is a primary care patient of Del Nigel Berthold, FNP. ANALEYA NORTHUP was referred to the pharmacist for assistance related to HTN and HLD.    A third unsuccessful telephone outreach was attempted today to contact the patient who was referred to the pharmacy team for assistance with medication assistance. The Population Health team is pleased to engage with this patient at any time in the future upon receipt of referral and should he/she be interested in assistance from the [facility] team.   Penne Lash, RMA Care Guide [facility]  [place], [place] [address] Direct Dial: [phone] [email or web address]

## 2022-11-24 DIAGNOSIS — R7303 Prediabetes: Secondary | ICD-10-CM | POA: Diagnosis not present

## 2022-11-24 DIAGNOSIS — E538 Deficiency of other specified B group vitamins: Secondary | ICD-10-CM | POA: Diagnosis not present

## 2022-11-24 DIAGNOSIS — I10 Essential (primary) hypertension: Secondary | ICD-10-CM | POA: Diagnosis not present

## 2022-11-24 DIAGNOSIS — E782 Mixed hyperlipidemia: Secondary | ICD-10-CM | POA: Diagnosis not present

## 2022-11-25 ENCOUNTER — Other Ambulatory Visit: Payer: Self-pay | Admitting: Family Medicine

## 2022-11-25 DIAGNOSIS — R748 Abnormal levels of other serum enzymes: Secondary | ICD-10-CM

## 2022-11-25 LAB — CBC WITH DIFFERENTIAL/PLATELET
Basophils Absolute: 0 10*3/uL (ref 0.0–0.2)
Basos: 1 %
EOS (ABSOLUTE): 0.3 10*3/uL (ref 0.0–0.4)
Eos: 7 %
Hematocrit: 41.6 % (ref 34.0–46.6)
Hemoglobin: 13.3 g/dL (ref 11.1–15.9)
Immature Grans (Abs): 0 10*3/uL (ref 0.0–0.1)
Immature Granulocytes: 0 %
Lymphocytes Absolute: 1.2 10*3/uL (ref 0.7–3.1)
Lymphs: 26 %
MCH: 29.6 pg (ref 26.6–33.0)
MCHC: 32 g/dL (ref 31.5–35.7)
MCV: 93 fL (ref 79–97)
Monocytes Absolute: 0.7 10*3/uL (ref 0.1–0.9)
Monocytes: 14 %
Neutrophils Absolute: 2.5 10*3/uL (ref 1.4–7.0)
Neutrophils: 52 %
Platelets: 191 10*3/uL (ref 150–450)
RBC: 4.49 x10E6/uL (ref 3.77–5.28)
RDW: 13 % (ref 11.7–15.4)
WBC: 4.8 10*3/uL (ref 3.4–10.8)

## 2022-11-25 LAB — CMP14+EGFR
ALT: 148 IU/L — ABNORMAL HIGH (ref 0–32)
AST: 116 IU/L — ABNORMAL HIGH (ref 0–40)
Albumin: 4.2 g/dL (ref 3.8–4.8)
Alkaline Phosphatase: 200 IU/L — ABNORMAL HIGH (ref 44–121)
BUN/Creatinine Ratio: 13 (ref 12–28)
BUN: 11 mg/dL (ref 8–27)
Bilirubin Total: 0.4 mg/dL (ref 0.0–1.2)
CO2: 28 mmol/L (ref 20–29)
Calcium: 9.3 mg/dL (ref 8.7–10.3)
Chloride: 103 mmol/L (ref 96–106)
Creatinine, Ser: 0.82 mg/dL (ref 0.57–1.00)
Globulin, Total: 2.2 g/dL (ref 1.5–4.5)
Glucose: 86 mg/dL (ref 70–99)
Potassium: 3.8 mmol/L (ref 3.5–5.2)
Sodium: 144 mmol/L (ref 134–144)
Total Protein: 6.4 g/dL (ref 6.0–8.5)
eGFR: 73 mL/min/{1.73_m2} (ref >59)

## 2022-11-25 LAB — B12 AND FOLATE PANEL
Folate: >20 ng/mL (ref >3.0)
Vitamin B-12: 768 pg/mL (ref 232–1245)

## 2022-11-25 LAB — LIPID PANEL
Chol/HDL Ratio: 2.4 ratio (ref 0.0–4.4)
Cholesterol, Total: 166 mg/dL (ref 100–199)
HDL: 70 mg/dL (ref >39)
LDL Chol Calc (NIH): 83 mg/dL (ref 0–99)
Triglycerides: 66 mg/dL (ref 0–149)
VLDL Cholesterol Cal: 13 mg/dL (ref 5–40)

## 2022-11-25 LAB — HEMOGLOBIN A1C
Est. average glucose Bld gHb Est-mCnc: 120 mg/dL
Hgb A1c MFr Bld: 5.8 % — ABNORMAL HIGH (ref 4.8–5.6)

## 2022-11-30 ENCOUNTER — Ambulatory Visit (HOSPITAL_BASED_OUTPATIENT_CLINIC_OR_DEPARTMENT_OTHER): Payer: Medicare Other

## 2022-12-03 ENCOUNTER — Ambulatory Visit: Payer: Medicare Other | Admitting: Gastroenterology

## 2022-12-03 ENCOUNTER — Encounter: Payer: Self-pay | Admitting: Gastroenterology

## 2022-12-03 ENCOUNTER — Other Ambulatory Visit: Payer: Self-pay | Admitting: *Deleted

## 2022-12-03 ENCOUNTER — Other Ambulatory Visit: Payer: Self-pay | Admitting: Nurse Practitioner

## 2022-12-03 VITALS — BP 97/60 | HR 81 | Temp 97.7°F | Ht 63.0 in | Wt 147.4 lb

## 2022-12-03 DIAGNOSIS — T402X5A Adverse effect of other opioids, initial encounter: Secondary | ICD-10-CM | POA: Diagnosis not present

## 2022-12-03 DIAGNOSIS — K219 Gastro-esophageal reflux disease without esophagitis: Secondary | ICD-10-CM

## 2022-12-03 DIAGNOSIS — K5903 Drug induced constipation: Secondary | ICD-10-CM

## 2022-12-03 DIAGNOSIS — R7989 Other specified abnormal findings of blood chemistry: Secondary | ICD-10-CM | POA: Diagnosis not present

## 2022-12-03 NOTE — Patient Instructions (Addendum)
I am providing with some samples of Movantik today.  As suspected this would be fairly expensive even with good Rx.  There is a medication that has a generic form called Amitiza (lubiprostone) that you can get through good Rx for about $40/ month through [facility].  This is a good option for your constipation as well if it something you would like to consider please let me know.  Continue your fiber pills daily.  Please also continue to take MiraLAX as you are able to remember  I will discuss your rising liver enzymes with cardiology and see if they recommend reducing her dose again.  Will plan to recheck your labs in 6 weeks.  Follow up in 3 months.  It was a pleasure to see you today. I want to create trusting relationships with patients. If you receive a survey regarding your visit,  I greatly appreciate you taking time to fill this out on paper or through your MyChart. I value your feedback.  Brooke Bonito, MSN, FNP-BC, AGACNP-BC [facility]

## 2022-12-03 NOTE — Progress Notes (Signed)
GI Office Note    Referring Provider: Wylene Men* Primary Care Physician:  Rica Records, FNP Primary Gastroenterologist: Hennie Duos. Marletta Lor, DO  Date:  12/03/2022  ID:  [name], [name] 07/03/1945, MRN [id number]   Chief Complaint   Chief Complaint  Patient presents with   Follow-up    Follow up. Still having problems with constipation     History of Present Illness  [name] is a 77 y.o. female with a history of CAD s/p stents x 2 to LAD in June 2023, CHF, non-Hodgkin's lymphoma, arthritis, HTN presenting today for follow-up.  Last colonoscopy in December 2020: -3 polyps ranging 3-6 mm in the cecum, ascending, and descending colon.  -pathology revealed 1 sessile serrated adenoma and 2 tubular adenomas.   Labs 03/11/22: CBC normal. A1c 5.6. Normal Lipid panel. Hep C Ab and HIV negative. Vit D normal. TSH normal. Cr 1.1, GFR 52, Na 146, alk phos 146, AST 62, ALT 61   Last office visit 05/04/2022.  Taking smooth lax 1 capful 2-3 times per week and fiber 2-3 capsules daily.  Given when she has her other medications she has trouble remembering to take the smooth lax.  Has intermittent abdominal pain she suspects is related to her medications and improves after bowel movements.  She does use suppositories about once every 2 weeks if needed.  Reflux controlled with pantoprazole 40 mg once daily.  Stated liver enzymes were normal until atorvastatin dose doubled.  Denied any herbal supplements, tattoos, blood transfusions, or NSAIDs.  No regular alcohol use.  Advised to follow fatty liver diet.  Serologies checked including autoimmune serologies and viral hepatitis labs and complete RUQ [place].  Advise continue to hold atorvastatin for now and then consider resuming low dose in the future.  Advised to continue MiraLAX 1 capful daily and pantoprazole 40 mg daily.  Labs 05/18/2022: GFR 57, creatinine 1.01, glucose 103.  Sodium 137.  AST, ALT, and alk phos within normal  limits.  Negative for viral hepatitis.  ANA, AMA, IgG, IgA, and IgM negative. ASMA positive.   RUQ [place] 05/18/2022: -No cholelithiasis or acute cholecystitis -CBD 4.4 mm -No focal liver lesion identified     Latest Ref Rng & Units 11/24/2022    1:27 PM 07/06/2022    2:09 PM 05/18/2022   12:33 PM  Hepatic Function  Total Protein 6.0 - 8.5 g/dL 6.4  6.8  7.2   Albumin 3.8 - 4.8 g/dL 4.2  3.8  4.0   AST 0 - 40 IU/L 116  38  31   ALT 0 - 32 IU/L 148  40  21   Alk Phosphatase 44 - 121 IU/L 200  108  76   Total Bilirubin 0.0 - 1.2 mg/dL 0.4  0.4  0.6     Today: Forgets to take her miralax and does not like to take it. Thinks it because it is a high volume. Still struggling with constipation. Sometimes goes almost a week without a BM. This week she did remember to take her miralax. She does remember to take her fiber pills for the most part. Sometimes stools are formed and sometimes it is not. Her consistency depends on what she eats. Has been trying to increase fiber in her diet.   Continues to take medication for pain everyday. Has a lot of knee pain with burning, numbing, sharp pain. Takes oxycodone 3-4/day and gabapentin.   Admits to having some trouble with memory and recall  of certain things.   Restarted on atorvastatin 20 mg and just recently mid September she was placed on atorvastatin 40 due to elevated LDL.   Goes next week for special chest CT to evaluate lung nodule.    Current Outpatient Medications  Medication Sig Dispense Refill   ALPRAZolam (XANAX) 0.5 MG tablet Take 1 tablet (0.5 mg total) by mouth daily as needed. 30 tablet 0   atorvastatin (LIPITOR) 40 MG tablet Take 1 tablet (40 mg total) by mouth daily. 30 tablet 6   bisacodyl (DULCOLAX) 10 MG suppository Place 1 suppository (10 mg total) rectally daily as needed for moderate constipation. 12 suppository 0   Calcium Carb-Cholecalciferol (OYSTER SHELL CALCIUM W/D) 500-5 MG-MCG TABS Take by mouth.     clopidogrel (PLAVIX)  75 MG tablet TAKE 1 TABLET BY MOUTH DAILY 90 tablet 3   cyanocobalamin (,VITAMIN B-12,) 1000 MCG/ML injection Inject 1 mcg into the muscle every 30 (thirty) days.     dapagliflozin propanediol (FARXIGA) 10 MG TABS tablet Take 1 tablet (10 mg total) by mouth daily. 30 tablet 5   diclofenac Sodium (VOLTAREN) 1 % GEL Apply 2 g topically 4 (four) times daily as needed (pain).     DULoxetine (CYMBALTA) 30 MG capsule Take 30 mg by mouth 3 (three) times daily.     fluticasone (FLONASE) 50 MCG/ACT nasal spray Place 2 sprays into both nostrils daily as needed for allergies.     furosemide (LASIX) 40 MG tablet Take 1 tablet (40 mg total) by mouth as needed (shortness of breath or weight gain). (Patient taking differently: Take 40 mg by mouth daily as needed (shortness of breath or weight gain).) 30 tablet 3   gabapentin (NEURONTIN) 800 MG tablet Take 800 mg by mouth 3 (three) times daily.     losartan (COZAAR) 25 MG tablet Take 0.5 tablets (12.5 mg total) by mouth daily. 45 tablet 1   nitroGLYCERIN (NITROSTAT) 0.4 MG SL tablet Place 1 tablet (0.4 mg total) under the tongue every 5 (five) minutes x 3 doses as needed for chest pain. 25 tablet 12   oxycodone (ROXICODONE) 30 MG immediate release tablet Take 30 mg by mouth 3 (three) times daily as needed for pain.     pantoprazole (PROTONIX) 40 MG tablet Take 1 tablet (40 mg total) by mouth daily. 30 tablet 2   polyethylene glycol powder (GLYCOLAX/MIRALAX) 17 GM/SCOOP powder Take 1 Container by mouth once.     potassium chloride SA (KLOR-CON M) 20 MEQ tablet Take 1 tablet (20 mEq total) by mouth as needed (only on the days that you take the Lasix). 30 tablet 3   triamcinolone ointment (KENALOG) 0.5 % Apply 1 Application topically 2 (two) times daily. 30 g 0   No current facility-administered medications for this visit.    Past Medical History:  Diagnosis Date   Arthritis    CAD (coronary artery disease)    a. cath in 07/2021 showing severe multivessel CAD  --> not felt to be a CABG candidate and limited viable myocardium --> received DESx2 to LAD   CHF (congestive heart failure) (HCC)    a. EF 30-35% by echo in 07/2021, normalized to 60-65% by repeat imaging later that month.   Hx of non-Hodgkin's lymphoma    Hyperlipidemia    Hypertension    Left ventricular apical thrombus    a. noted on cMRI in 07/2021    Past Surgical History:  Procedure Laterality Date   CORONARY STENT INTERVENTION N/A 08/08/2021  Procedure: CORONARY STENT INTERVENTION;  Surgeon: Tonny Bollman, MD;  Location: [facility] INVASIVE CV LAB;  Service: Cardiovascular;  Laterality: N/A;   CORONARY ULTRASOUND/IVUS N/A 08/08/2021   Procedure: Intravascular Ultrasound/IVUS;  Surgeon: Tonny Bollman, MD;  Location: [facility] INVASIVE CV LAB;  Service: Cardiovascular;  Laterality: N/A;   LEFT HEART CATH AND CORONARY ANGIOGRAPHY N/A 08/02/2021   Procedure: LEFT HEART CATH AND CORONARY ANGIOGRAPHY;  Surgeon: Yvonne Kendall, MD;  Location: [facility] INVASIVE CV LAB;  Service: Cardiovascular;  Laterality: N/A;    Family History  Problem Relation Age of Onset   Aortic aneurysm Mother    Coronary artery disease Brother     Allergies as of 12/03/2022 - Review Complete 12/03/2022  Allergen Reaction Noted   Morphine Rash 08/22/2014    Social History   Socioeconomic History   Marital status: Widowed    Spouse name: Not on file   Number of children: Not on file   Years of education: Not on file   Highest education level: Not on file  Occupational History   Not on file  Tobacco Use   Smoking status: Every Day    Current packs/day: 0.00    Types: Cigarettes, E-cigarettes    Last attempt to quit: 07/03/2022    Years since quitting: 0.4    Passive exposure: Never   Smokeless tobacco: Never   Tobacco comments:    Quit 07/03/2022 07/08/22 [place]  Vaping Use   Vaping status: Never Used  Substance and Sexual Activity   Alcohol use: Not Currently   Drug use: Never   Sexual activity: Not on file  Other  Topics Concern   Not on file  Social History Narrative   Not on file   Social Determinants of Health   Financial Resource Strain: Low Risk  (11/11/2022)   Overall Financial Resource Strain (CARDIA)    Difficulty of Paying Living Expenses: Not hard at all  Food Insecurity: No Food Insecurity (11/11/2022)   Hunger Vital Sign    Worried About Running Out of Food in the Last Year: Never true    Ran Out of Food in the Last Year: Never true  Transportation Needs: No Transportation Needs (11/11/2022)   PRAPARE - Administrator, Civil Service (Medical): No    Lack of Transportation (Non-Medical): No  Physical Activity: Inactive (11/11/2022)   Exercise Vital Sign    Days of Exercise per Week: 0 days    Minutes of Exercise per Session: 0 min  Stress: Stress Concern Present (11/11/2022)   [employer] of Occupational Health - Occupational Stress Questionnaire    Feeling of Stress : Rather much  Social Connections: Moderately Integrated (11/11/2022)   Social Connection and Isolation Panel [NHANES]    Frequency of Communication with Friends and Family: More than three times a week    Frequency of Social Gatherings with Friends and Family: More than three times a week    Attends Religious Services: More than 4 times per year    Active Member of [employer] or Organizations: Yes    Attends Banker Meetings: More than 4 times per year    Marital Status: Widowed     Review of Systems   Gen: Denies fever, chills, anorexia. Denies fatigue, weakness, weight loss.  CV: Denies chest pain, palpitations, syncope, peripheral edema, and claudication. Resp: Denies dyspnea at rest, cough, wheezing, coughing up blood, and pleurisy. GI: See HPI Derm: Denies rash, itching, dry skin Psych: Denies depression, anxiety, memory loss, confusion. No homicidal or  suicidal ideation.  Heme: Denies bruising, bleeding, and enlarged lymph nodes.   Physical Exam   BP 97/60 (BP Location: Right  Arm, Patient Position: Sitting, Cuff Size: Normal)   Pulse 81   Temp 97.7 F (36.5 C) (Temporal)   Ht 5\' 3"  (1.6 m)   Wt 147 lb 6.4 oz (66.9 kg)   SpO2 97%   BMI 26.11 kg/m   General:   Alert and oriented. No distress noted. Pleasant and cooperative.  Head:  Normocephalic and atraumatic. Eyes:  Conjuctiva clear without scleral icterus. Mouth:  Oral mucosa pink and moist. Good dentition. No lesions. Abdomen:  +BS, soft, non-tender and non-distended. No rebound or guarding. No HSM or masses noted. Rectal: deferred Msk:  Symmetrical without gross deformities. Normal posture. Extremities:  Without edema. Neurologic:  Alert and  oriented x4 Psych:  Alert and cooperative. Normal mood and affect.   Assessment  [name] is a 77 y.o. female with a history of CAD s/p stents x 2 to LAD in June 2023, CHF, non-Hodgkin's lymphoma, arthritis, HTN presenting today for follow-up.  Opioid-induced constipation: Continues to struggle with constipation.  Sometimes can go a week without a bowel movement.  Continues to take fiber supplementation however forgets periodically to take MiraLAX that she was advised to take daily.  She reports difficulties with affording medication given she is in the donut hole with her insurance.  We will provide trial of Movantik 12.5 mg daily.  Discussed that she may be a candidate for lubiprostone which may be an affordable option for her.  GERD: Well-controlled with pantoprazole 40 mg once daily.  Denies any nausea, or dysphagia.  Elevated LFTs: Elevated LFTs in January 2024 with AP 146, AST 162, ALT 61 at this time she was on atorvastatin 80 mg once daily and was previously on simvastatin prior to that without any elevation of liver enzymes.  Workup for elevated LFTs includes normal ultrasound and negative for viral hepatitis.  Autoimmune serologies checked with negative AMA, ANA, IgG, IgM, and IgA but with positive ASMA.  Liver enzymes normalized in March and again in  May when she was off of atorvastatin at that time.  Started back on atorvastatin mid May after her labs at 20 mg daily and recently increased to 40 mg daily 9/19.  Most recent labs 9/24 again with elevated LFTs, this time increased more so than in January.  Although autoimmune hepatitis remains within the differential, it is more suspicious that this is potential side effect of atorvastatin.  Plans to discuss this with Sharlene Dory and/or Dr. Wyline Mood to see about decreasing her dose of atorvastatin and rechecking labs in about 6 weeks.  Would like to avoid liver biopsy and possible but if ongoing elevation may need to consider this versus checking additional autoimmune serologies.  PLAN   Trial Movantik 12.5 mg daily Continue fiber pills daily If not able to get Movantik then continue miralax daily.  Repeat LFT in 6 weeks Consider reducing atorvastatin, reach out to cardiology to discuss decreasing atorvastatin and repeating labs Continue pantoprazole 40 mg once daily Follow up in 3 months.     Brooke Bonito, MSN, FNP-BC, AGACNP-BC [facility]

## 2022-12-07 ENCOUNTER — Ambulatory Visit (HOSPITAL_BASED_OUTPATIENT_CLINIC_OR_DEPARTMENT_OTHER)
Admission: RE | Admit: 2022-12-07 | Discharge: 2022-12-07 | Disposition: A | Discharge status: Home / Self Care | Payer: Medicare Other | Source: Ambulatory Visit | Attending: Pulmonary Disease | Admitting: Pulmonary Disease

## 2022-12-07 DIAGNOSIS — R911 Solitary pulmonary nodule: Secondary | ICD-10-CM | POA: Diagnosis not present

## 2022-12-07 DIAGNOSIS — R918 Other nonspecific abnormal finding of lung field: Secondary | ICD-10-CM | POA: Diagnosis not present

## 2022-12-07 DIAGNOSIS — I700 Atherosclerosis of aorta: Secondary | ICD-10-CM | POA: Diagnosis not present

## 2022-12-08 ENCOUNTER — Encounter: Payer: Self-pay | Admitting: Nurse Practitioner

## 2022-12-10 ENCOUNTER — Telehealth: Payer: Self-pay | Admitting: Nurse Practitioner

## 2022-12-10 DIAGNOSIS — I2101 ST elevation (STEMI) myocardial infarction involving left main coronary artery: Secondary | ICD-10-CM

## 2022-12-10 DIAGNOSIS — E785 Hyperlipidemia, unspecified: Secondary | ICD-10-CM

## 2022-12-10 DIAGNOSIS — N179 Acute kidney failure, unspecified: Secondary | ICD-10-CM

## 2022-12-10 DIAGNOSIS — I5022 Chronic systolic (congestive) heart failure: Secondary | ICD-10-CM

## 2022-12-10 MED ORDER — ATORVASTATIN CALCIUM 20 MG PO TABS: 20.0000 mg | ORAL_TABLET | Freq: Every day | ORAL | 1 refills | Status: DC

## 2022-12-10 NOTE — Telephone Encounter (Signed)
-----   Message from Sharlene Dory sent at 12/06/2022  4:59 PM EDT ----- Reynold Bowen,   That would be fine. I agree that lowering this dose would be good to evaluate how her LFT's are doing.   I will CC my CMA to instruct patient to lower her atorvastatin to 20 mg daily and obtain FLP and LFT in 6 weeks. I will also loop in Dr. Wyline Mood so he is aware.   Thanks!   Kind Regards,  Sharlene Dory, NP ----- Message ----- From: Aida Raider, NP Sent: 12/03/2022   3:47 PM EDT To: Antoine Poche, MD; Sharlene Dory, NP  Hi guys, I just had a follow-up with Ms. Myna Bright.  She let me know that her atorvastatin dose was just recently increased to 40 mg daily.  Seeing that she was previously on 80 mg with elevated liver enzymes earlier this year and then improved after coming off medication I suspect that her most recent labs with elevated liver enzymes are secondary to her atorvastatin.   I wanted to get you guys input on decreasing her atorvastatin to 20 mg daily and rechecking her LFTs in 6 weeks.  Her only autoimmune labs that were positive with her ASMA and although this could be indicative of autoimmune hepatitis I feel like that suspicion is very low right now.  However, I would like to try to completely rule out medication side effect prior to repeating some expensive autoimmune labs and considering liver biopsy.  Please let me know what you guys think and if you feel like we can reduce her atorvastatin back to 20 mg daily then I will notify her and then we will repeat labs.  Brooke Bonito, MSN, APRN, FNP-BC, AGACNP-BC [facility]

## 2022-12-10 NOTE — Telephone Encounter (Signed)
Patient verbalized understanding and lab orders mailed to her home

## 2022-12-17 ENCOUNTER — Telehealth: Payer: Self-pay | Admitting: Acute Care

## 2022-12-17 NOTE — Telephone Encounter (Signed)
Pt has questions for Kandice Robinsons

## 2022-12-18 ENCOUNTER — Ambulatory Visit: Payer: Medicare Other | Admitting: Acute Care

## 2022-12-21 NOTE — Telephone Encounter (Signed)
Called and spoke to pt regarding the questions she has for Kandice Robinsons NP. Reviewed the results with the patient. She questioned if she would need to have a bronch. Advised patient she would discuss the results and the recommendations at her visit with Maralyn Sago on 11/8. Advised pt the nodules are stable. Patient has concerns a biopsy could cause disruption in the stability of the nodule. Advised pt these concerns and questions could be reviewed at her visit with Maralyn Sago. Patient was grateful for the call. Nothing further needed at this time.

## 2022-12-22 NOTE — Progress Notes (Signed)
Follow up appt with SG in November scheduled.   Thanks,  BLI  Jodi Igo, DO [facility] 12/22/2022 3:27 PM

## 2023-01-04 ENCOUNTER — Other Ambulatory Visit: Payer: Self-pay | Admitting: *Deleted

## 2023-01-04 DIAGNOSIS — K5903 Drug induced constipation: Secondary | ICD-10-CM

## 2023-01-04 DIAGNOSIS — R7989 Other specified abnormal findings of blood chemistry: Secondary | ICD-10-CM

## 2023-01-06 ENCOUNTER — Inpatient Hospital Stay: Payer: Medicare Other | Attending: Hematology

## 2023-01-06 DIAGNOSIS — C8205 Follicular lymphoma grade I, lymph nodes of inguinal region and lower limb: Secondary | ICD-10-CM | POA: Insufficient documentation

## 2023-01-06 LAB — CBC WITH DIFFERENTIAL/PLATELET
Abs Immature Granulocytes: 0.01 10*3/uL (ref 0.00–0.07)
Basophils Absolute: 0 10*3/uL (ref 0.0–0.1)
Basophils Relative: 1 %
Eosinophils Absolute: 0.3 10*3/uL (ref 0.0–0.5)
Eosinophils Relative: 5 %
HCT: 41.4 % (ref 36.0–46.0)
Hemoglobin: 13.4 g/dL (ref 12.0–15.0)
Immature Granulocytes: 0 %
Lymphocytes Relative: 25 %
Lymphs Abs: 1.4 10*3/uL (ref 0.7–4.0)
MCH: 30.3 pg (ref 26.0–34.0)
MCHC: 32.4 g/dL (ref 30.0–36.0)
MCV: 93.7 fL (ref 80.0–100.0)
Monocytes Absolute: 0.7 10*3/uL (ref 0.1–1.0)
Monocytes Relative: 12 %
Neutro Abs: 3.2 10*3/uL (ref 1.7–7.7)
Neutrophils Relative %: 57 %
Platelets: 149 10*3/uL — ABNORMAL LOW (ref 150–400)
RBC: 4.42 MIL/uL (ref 3.87–5.11)
RDW: 14 % (ref 11.5–15.5)
WBC: 5.6 10*3/uL (ref 4.0–10.5)
nRBC: 0 % (ref 0.0–0.2)

## 2023-01-06 LAB — COMPREHENSIVE METABOLIC PANEL
ALT: 82 U/L — ABNORMAL HIGH (ref 0–44)
AST: 80 U/L — ABNORMAL HIGH (ref 15–41)
Albumin: 3.6 g/dL (ref 3.5–5.0)
Alkaline Phosphatase: 113 U/L (ref 38–126)
Anion gap: 10 (ref 5–15)
BUN: 17 mg/dL (ref 8–23)
CO2: 25 mmol/L (ref 22–32)
Calcium: 8.6 mg/dL — ABNORMAL LOW (ref 8.9–10.3)
Chloride: 101 mmol/L (ref 98–111)
Creatinine, Ser: 0.93 mg/dL (ref 0.44–1.00)
GFR, Estimated: >60 mL/min (ref >60)
Glucose, Bld: 133 mg/dL — ABNORMAL HIGH (ref 70–99)
Potassium: 3.4 mmol/L — ABNORMAL LOW (ref 3.5–5.1)
Sodium: 136 mmol/L (ref 135–145)
Total Bilirubin: 0.6 mg/dL (ref <1.2)
Total Protein: 6.5 g/dL (ref 6.5–8.1)

## 2023-01-06 LAB — LACTATE DEHYDROGENASE: LDH: 154 U/L (ref 98–192)

## 2023-01-08 ENCOUNTER — Ambulatory Visit: Payer: Medicare Other | Admitting: Acute Care

## 2023-01-08 ENCOUNTER — Encounter: Payer: Self-pay | Admitting: Acute Care

## 2023-01-08 VITALS — BP 126/74 | HR 71 | Ht 63.5 in | Wt 153.0 lb

## 2023-01-08 DIAGNOSIS — R911 Solitary pulmonary nodule: Secondary | ICD-10-CM

## 2023-01-08 DIAGNOSIS — F17210 Nicotine dependence, cigarettes, uncomplicated: Secondary | ICD-10-CM | POA: Diagnosis not present

## 2023-01-08 NOTE — Progress Notes (Signed)
History of Present Illness [name] is a 77 y.o. female recently quit  smoker with a 40 + pack year smoking history and history of  non-Hodgkin's lymphoma referred to Dr. Tonia Brooms  07/2022 for evaluation of a lung nodule.  She cancelled a bronch, and has chosen to just have surveillance with imaging. She has been followed by Dr. Tonia Brooms.   Synopsis 77 year old female, past medical history of coronary artery disease non-Hodgkin's lymphoma, hyperlipidemia, hypertension, chronic systolic heart failure ,longstanding history of tobacco use. Patient had CT imaging of the chest that was complete during evaluation at [facility]. This showed a left upper lobe lesion groundglass subsolid in July 2023. She has not had CT imaging follow-up since then. She does have history of vascular disease on Plavix. She still is smoking and vaping. She has smoked for 40+ years. No previous history of malignancy, no family history of lung cancer. She saw Dr. Tonia Brooms 07/08/2022. Plan after that visit was for short term imaging,and PFT's, then follow up to review both. He felt if the nodule was persistent, we would move forward with consideration for bronchoscopy and biopsy. As lesion is subsolid in nature, he did not feel PET scan  would help differentiate malignancy or not in this setting.    01/08/2023 Pt. Presents for follow up after imaging as surveillance for left upper lobe lung nodule. The nodule of concern is stable to slightly decreased in size. Pt. Prefers to continue to monitor with imaging. We will do a short term follow up in 3 months and have the patient follow up in the office to review results.She has been doing well. She has no unintentional weight loss, no hemoptysis.  She has quit smoking cigarettes and does occasionally vape. We discussed that she needs to quit this also.  Test Results: CT Chest without contrast 12/07/2022 Stable to decreased size of ground-glass nodule adjacent to the aortic arch.  2.  Stable 4 mm anterior left apical nodule. 3. Dense coronary artery calcifications. 4. 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 5.  Aortic Atherosclerosis (ICD10-I70.0).  Super D CT Chest 08/28/2022 Stable non-solid nodules in the left upper lobe measuring up to 1.5 cm. Adenocarcinoma cannot be excluded. Follow up by CT is recommended in 12 months, with continued annual surveillance for a minimum of 3 years. These recommendations are taken from: Recommendations for the Management of Subsolid Pulmonary Nodules Detected at CT: A Statement from the Fleischner Society Radiology 2013; 266:1, 098-119. 2. Coronary artery calcifications. 3. Aortic Atherosclerosis (ICD10-I70.0).       Latest Ref Rng & Units 01/06/2023    3:05 PM 11/24/2022    1:27 PM 07/06/2022    2:09 PM  CBC  WBC 4.0 - 10.5 K/uL 5.6  4.8  5.4   Hemoglobin 12.0 - 15.0 g/dL 14.7  82.9  56.2   Hematocrit 36.0 - 46.0 % 41.4  41.6  43.1   Platelets 150 - 400 K/uL 149  191  177        Latest Ref Rng & Units 01/06/2023    3:05 PM 11/24/2022    1:27 PM 07/06/2022    2:09 PM  BMP  Glucose 70 - 99 mg/dL 130  86  89   BUN 8 - 23 mg/dL 17  11  15    Creatinine 0.44 - 1.00 mg/dL 8.65  7.84  6.96   BUN/Creat Ratio 12 - 28  13    Sodium 135 - 145 mmol/L  136  144  139   Potassium 3.5 - 5.1 mmol/L 3.4  3.8  3.6   Chloride 98 - 111 mmol/L 101  103  100   CO2 22 - 32 mmol/L 25  28  30    Calcium 8.9 - 10.3 mg/dL 8.6  9.3  9.1     BNP    Component Value Date/Time   BNP 513.5 (H) 08/05/2021 0148    ProBNP No results found for: "PROBNP"  PFT    Component Value Date/Time   FEV1PRE 2.08 09/01/2022 0957   FEV1POST 2.07 09/01/2022 0957   FVCPRE 2.60 09/01/2022 0957   FVCPOST 2.51 09/01/2022 0957   TLC 4.63 09/01/2022 0957   DLCOUNC 19.06 09/01/2022 0957   PREFEV1FVCRT 80 09/01/2022 0957   PSTFEV1FVCRT 83 09/01/2022 0957    No results found.   Past medical hx Past Medical History:  Diagnosis Date    Arthritis    CAD (coronary artery disease)    a. cath in 07/2021 showing severe multivessel CAD --> not felt to be a CABG candidate and limited viable myocardium --> received DESx2 to LAD   CHF (congestive heart failure) (HCC)    a. EF 30-35% by echo in 07/2021, normalized to 60-65% by repeat imaging later that month.   Hx of non-Hodgkin's lymphoma    Hyperlipidemia    Hypertension    Left ventricular apical thrombus    a. noted on cMRI in 07/2021     Social History   Tobacco Use   Smoking status: Every Day    Current packs/day: 0.00    Types: Cigarettes, E-cigarettes    Last attempt to quit: 07/03/2022    Years since quitting: 0.5    Passive exposure: Never   Smokeless tobacco: Never   Tobacco comments:    Quit 07/03/2022 07/08/22 Tay  Vaping Use   Vaping status: Never Used  Substance Use Topics   Alcohol use: Not Currently   Drug use: Never    [name] reports that she has been smoking cigarettes and e-cigarettes. She has never been exposed to tobacco smoke. She has never used smokeless tobacco. She reports that she does not currently use alcohol. She reports that she does not use drugs.  Tobacco Cessation: Ready to quit: Not Answered Counseling given: Not Answered Tobacco comments: Quit 07/03/2022 07/08/22 Tay Quit smoking two months ago Still vapes Counseled to quit.  Past surgical hx, Family hx, Social hx all reviewed.  Current Outpatient Medications on File Prior to Visit  Medication Sig   ALPRAZolam (XANAX) 0.5 MG tablet Take 1 tablet (0.5 mg total) by mouth daily as needed.   atorvastatin (LIPITOR) 20 MG tablet Take 1 tablet (20 mg total) by mouth daily.   bisacodyl (DULCOLAX) 10 MG suppository Place 1 suppository (10 mg total) rectally daily as needed for moderate constipation.   Calcium Carb-Cholecalciferol (OYSTER SHELL CALCIUM W/D) 500-5 MG-MCG TABS Take by mouth.   clopidogrel (PLAVIX) 75 MG tablet TAKE 1 TABLET BY MOUTH DAILY   cyanocobalamin (,VITAMIN B-12,) 1000  MCG/ML injection Inject 1 mcg into the muscle every 30 (thirty) days.   dapagliflozin propanediol (FARXIGA) 10 MG TABS tablet Take 1 tablet (10 mg total) by mouth daily.   diclofenac Sodium (VOLTAREN) 1 % GEL Apply 2 g topically 4 (four) times daily as needed (pain).   DULoxetine (CYMBALTA) 30 MG capsule Take 30 mg by mouth 3 (three) times daily.   fluticasone (FLONASE) 50 MCG/ACT nasal spray Place 2 sprays into both nostrils daily as  needed for allergies.   furosemide (LASIX) 40 MG tablet Take 1 tablet (40 mg total) by mouth as needed (shortness of breath or weight gain). (Patient taking differently: Take 40 mg by mouth daily as needed (shortness of breath or weight gain).)   gabapentin (NEURONTIN) 800 MG tablet Take 800 mg by mouth 3 (three) times daily.   losartan (COZAAR) 25 MG tablet TAKE 0.5 TABLETS BY MOUTH DAILY   nitroGLYCERIN (NITROSTAT) 0.4 MG SL tablet Place 1 tablet (0.4 mg total) under the tongue every 5 (five) minutes x 3 doses as needed for chest pain.   oxycodone (ROXICODONE) 30 MG immediate release tablet Take 30 mg by mouth 3 (three) times daily as needed for pain.   pantoprazole (PROTONIX) 40 MG tablet Take 1 tablet (40 mg total) by mouth daily.   polyethylene glycol powder (GLYCOLAX/MIRALAX) 17 GM/SCOOP powder Take 1 Container by mouth once.   potassium chloride SA (KLOR-CON M) 20 MEQ tablet Take 1 tablet (20 mEq total) by mouth as needed (only on the days that you take the Lasix).   triamcinolone ointment (KENALOG) 0.5 % Apply 1 Application topically 2 (two) times daily.   No current facility-administered medications on file prior to visit.     Allergies  Allergen Reactions   Morphine Rash    Review Of Systems:  Constitutional:   No  weight loss, night sweats,  Fevers, chills, fatigue, or  lassitude.  HEENT:   No headaches,  Difficulty swallowing,  Tooth/dental problems, or  Sore throat,                No sneezing, itching, ear ache, nasal congestion, post nasal  drip,   CV:  No chest pain,  Orthopnea, PND, swelling in lower extremities, anasarca, dizziness, palpitations, syncope.   GI  No heartburn, indigestion, abdominal pain, nausea, vomiting, diarrhea, change in bowel habits, loss of appetite, bloody stools.   Resp: No shortness of breath with exertion or at rest.  No excess mucus, no productive cough,  No non-productive cough,  No coughing up of blood.  No change in color of mucus.  No wheezing.  No chest wall deformity  Skin: no rash or lesions.  GU: no dysuria, change in color of urine, no urgency or frequency.  No flank pain, no hematuria   MS:  No joint pain or swelling.  No decreased range of motion.  No back pain.  Psych:  No change in mood or affect. No depression or anxiety.  No memory loss.   Vital Signs BP 126/74 (BP Location: Right Arm, Cuff Size: Normal)   Pulse 71   Ht 5' 3.5" (1.613 m)   Wt 153 lb (69.4 kg)   SpO2 96%   BMI 26.68 kg/m    Physical Exam:  General- No distress,  A&Ox3, pleasant ENT: No sinus tenderness, TM clear, pale nasal mucosa, no oral exudate,no post nasal drip, no LAN Cardiac: S1, S2, regular rate and rhythm, no murmur Chest: No wheeze/ rales/ dullness; no accessory muscle use, no nasal flaring, no sternal retractions Abd.: Soft Non-tender, ND, BS +, Body mass index is 26.68 kg/m.  Ext: No clubbing cyanosis, edema Neuro:  normal strength, MAE x 4, A&O x 3, appropriate Skin: No rashes, warm and dry, no lesions Psych: normal mood and behavior   Assessment/Plan Abnormal Left Upper Lobe Nodule Your CT scan shows the nodule we have been following in your left upper lobe has decreased in size slightly in the last 2-3 months. We will  continue to follow this nodule with CT scans. I have ordered a CT Chest for 04/2023, which is in 3 months. . You will  get a call to get this scheduled closer to the time.  You will follow up with me in the office about 2 weeks after the scan is done to review the  results. Congratulations on quitting smoking cigarettes. Keep working on quitting vaping. We will see you in 3 months to review the results of the follow up scan. .  Call if you need [place] sooner. Please contact office for sooner follow up if symptoms do not improve or worsen or seek emergency care    I spent 30 minutes dedicated to the care of this patient on the date of this encounter to include pre-visit review of records, face-to-face time with the patient discussing conditions above, post visit ordering of testing, clinical documentation with the electronic health record, making appropriate referrals as documented, and communicating necessary information to the patient's healthcare team.     Bevelyn Ngo, NP 01/08/2023  1:16 PM

## 2023-01-08 NOTE — Patient Instructions (Addendum)
It is good to see you today. Your scan shows the nodule we have been following in your left upper lobe has decreased in size slightly in the last 2-3 months. We will continue to follow this nodule with CT scans. I have ordered a CT Chest for 04/2023 . You will  get a call to get this scheduled closer to the time.  You will follow up with me in the office about 2 weeks after the scan is done to review the results. Congratulations on quitting smoking cigarettes. Keep working on quitting vaping. We will see you in 3 months.  Call if you need [place] sooner. Please contact office for sooner follow up if symptoms do not improve or worsen or seek emergency care

## 2023-01-13 ENCOUNTER — Inpatient Hospital Stay: Payer: Medicare Other | Admitting: Hematology

## 2023-01-13 ENCOUNTER — Telehealth: Payer: Self-pay | Admitting: *Deleted

## 2023-01-13 NOTE — Progress Notes (Incomplete)
[facility] [address] [place] [address]   Clinic Day:  01/13/2023  Referring physician: Wylene Men*  Patient Care Team: Del Newman Nip, Tenna Child, FNP as PCP - General (Family Medicine) Wyline Mood Dorothe Pea, MD as PCP - Cardiology (Cardiology) Doreatha Massed, MD as Medical Oncologist (Medical Oncology)   ASSESSMENT & PLAN:   Assessment:  1.  Stage I grade 1 follicular lymphoma: - Diagnosed with follicular lymphoma of the left groin lymph node on 10/27/2013.  Lymph node was incidentally found on Doppler of the lower extremity for swelling. - Status post XRT completed in December 2015 at [facility]. - Recently had right groin mass biopsy which was benign.  2.  Social/family history: - She is currently living with her daughter and is independent of ADLs and IADLs.  She moved back from [place] to [place] in the last 1 year.  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  Quit smoking last week, smoked half pack per day to 3/4 pack/day for 60 years.  She will only smoked 3 to 4 cigarettes/day in the last 12 years. - Sister died of stomach cancer.  Paternal aunt died of breast cancer.  Plan:  1.  Stage I follicular lymphoma: - She does not report any B symptoms. - Physical exam today reveals no palpable adenopathy or splenomegaly. - Labs today reviewed by me shows normal LFTs, LDH and CBC. - Recommend follow-up in 6 months with labs and exam.  Will do CT scan if clinical condition dictates.  No orders of the defined types were placed in this encounter.     Alben Deeds Teague,acting as a Neurosurgeon for Doreatha Massed, MD.,have documented all relevant documentation on the behalf of Doreatha Massed, MD,as directed by  Doreatha Massed, MD while in the presence of Doreatha Massed, MD.  ***   [place] R Teague   11/13/202411:02 AM  CHIEF COMPLAINT/PURPOSE OF CONSULT:   Diagnosis: low grade  follicular lymphoma  Prior Therapy: radiation to left inguinal nodes, completed 02/27/14  Current Therapy:  surveillance  HISTORY OF PRESENT ILLNESS:   [name] is a 77 y.o. female presenting to clinic today for evaluation of low grade follicular lymphoma at the request of Dr. Janece Canterbury.  She was initially diagnosed with low grade follicular lymphoma in 09/2013 after presenting with enlarged inguinal lymph nodes. She was referred to Dr. Gypsy Lore in hematology and Dr. Donne Hazel in radiation oncology. She completed radiation to the left inguinal nodes on 02/27/14. She has been in remission since that time with no evidence of recurrence.  Today, she states that she is doing well overall. Her appetite level is at 100%. Her energy level is at 50%.  INTERVAL HISTORY:   [name] is a 77 y.o. female presenting to the clinic for follow-up of low grade follicular lymphoma. She was last seen by me on 07/06/22 in consultation.  Today, she states that she is doing well overall. Her appetite level is at ***%. Her energy level is at ***%.   PAST MEDICAL HISTORY:   Past Medical History: Past Medical History:  Diagnosis Date   Arthritis    CAD (coronary artery disease)    a. cath in 07/2021 showing severe multivessel CAD --> not felt to be a CABG candidate and limited viable myocardium --> received DESx2 to LAD   CHF (congestive heart failure) (HCC)    a. EF 30-35% by echo in  07/2021, normalized to 60-65% by repeat imaging later that month.   Hx of non-Hodgkin's lymphoma    Hyperlipidemia    Hypertension    Left ventricular apical thrombus    a. noted on cMRI in 07/2021    Surgical History: Past Surgical History:  Procedure Laterality Date   CORONARY STENT INTERVENTION N/A 08/08/2021   Procedure: CORONARY STENT INTERVENTION;  Surgeon: Tonny Bollman, MD;  Location: [facility] INVASIVE CV LAB;  Service: Cardiovascular;  Laterality: N/A;   CORONARY ULTRASOUND/IVUS N/A 08/08/2021   Procedure:  Intravascular Ultrasound/IVUS;  Surgeon: Tonny Bollman, MD;  Location: [facility] INVASIVE CV LAB;  Service: Cardiovascular;  Laterality: N/A;   LEFT HEART CATH AND CORONARY ANGIOGRAPHY N/A 08/02/2021   Procedure: LEFT HEART CATH AND CORONARY ANGIOGRAPHY;  Surgeon: Yvonne Kendall, MD;  Location: [facility] INVASIVE CV LAB;  Service: Cardiovascular;  Laterality: N/A;    Social History: Social History   Socioeconomic History   Marital status: Widowed    Spouse name: Not on file   Number of children: Not on file   Years of education: Not on file   Highest education level: Not on file  Occupational History   Not on file  Tobacco Use   Smoking status: Every Day    Current packs/day: 0.00    Types: Cigarettes, E-cigarettes    Last attempt to quit: 07/03/2022    Years since quitting: 0.5    Passive exposure: Never   Smokeless tobacco: Never   Tobacco comments:    Quit 07/03/2022 07/08/22 Tay  Vaping Use   Vaping status: Never Used  Substance and Sexual Activity   Alcohol use: Not Currently   Drug use: Never   Sexual activity: Not on file  Other Topics Concern   Not on file  Social History Narrative   Not on file   Social Determinants of Health   Financial Resource Strain: Low Risk  (11/11/2022)   Overall Financial Resource Strain (CARDIA)    Difficulty of Paying Living Expenses: Not hard at all  Food Insecurity: No Food Insecurity (11/11/2022)   Hunger Vital Sign    Worried About Running Out of Food in the Last Year: Never true    Ran Out of Food in the Last Year: Never true  Transportation Needs: No Transportation Needs (11/11/2022)   PRAPARE - Administrator, Civil Service (Medical): No    Lack of Transportation (Non-Medical): No  Physical Activity: Inactive (11/11/2022)   Exercise Vital Sign    Days of Exercise per Week: 0 days    Minutes of Exercise per Session: 0 min  Stress: Stress Concern Present (11/11/2022)   [employer] of Occupational Health - Occupational Stress  Questionnaire    Feeling of Stress : Rather much  Social Connections: Moderately Integrated (11/11/2022)   Social Connection and Isolation Panel [NHANES]    Frequency of Communication with Friends and Family: More than three times a week    Frequency of Social Gatherings with Friends and Family: More than three times a week    Attends Religious Services: More than 4 times per year    Active Member of [employer] or Organizations: Yes    Attends Banker Meetings: More than 4 times per year    Marital Status: Widowed  Intimate Partner Violence: Not At Risk (11/11/2022)   Humiliation, Afraid, Rape, and Kick questionnaire    Fear of Current or Ex-Partner: No    Emotionally Abused: No    Physically Abused: No    Sexually  Abused: No    Family History: Family History  Problem Relation Age of Onset   Aortic aneurysm Mother    Coronary artery disease Brother     Current Medications:  Current Outpatient Medications:    ALPRAZolam (XANAX) 0.5 MG tablet, Take 1 tablet (0.5 mg total) by mouth daily as needed., Disp: 30 tablet, Rfl: 0   atorvastatin (LIPITOR) 20 MG tablet, Take 1 tablet (20 mg total) by mouth daily., Disp: 90 tablet, Rfl: 1   bisacodyl (DULCOLAX) 10 MG suppository, Place 1 suppository (10 mg total) rectally daily as needed for moderate constipation., Disp: 12 suppository, Rfl: 0   Calcium Carb-Cholecalciferol (OYSTER SHELL CALCIUM W/D) 500-5 MG-MCG TABS, Take by mouth., Disp: , Rfl:    clopidogrel (PLAVIX) 75 MG tablet, TAKE 1 TABLET BY MOUTH DAILY, Disp: 90 tablet, Rfl: 3   cyanocobalamin (,VITAMIN B-12,) 1000 MCG/ML injection, Inject 1 mcg into the muscle every 30 (thirty) days., Disp: , Rfl:    dapagliflozin propanediol (FARXIGA) 10 MG TABS tablet, Take 1 tablet (10 mg total) by mouth daily., Disp: 30 tablet, Rfl: 5   diclofenac Sodium (VOLTAREN) 1 % GEL, Apply 2 g topically 4 (four) times daily as needed (pain)., Disp: , Rfl:    DULoxetine (CYMBALTA) 30 MG capsule,  Take 30 mg by mouth 3 (three) times daily., Disp: , Rfl:    fluticasone (FLONASE) 50 MCG/ACT nasal spray, Place 2 sprays into both nostrils daily as needed for allergies., Disp: , Rfl:    furosemide (LASIX) 40 MG tablet, Take 1 tablet (40 mg total) by mouth as needed (shortness of breath or weight gain). (Patient taking differently: Take 40 mg by mouth daily as needed (shortness of breath or weight gain).), Disp: 30 tablet, Rfl: 3   gabapentin (NEURONTIN) 800 MG tablet, Take 800 mg by mouth 3 (three) times daily., Disp: , Rfl:    losartan (COZAAR) 25 MG tablet, TAKE 0.5 TABLETS BY MOUTH DAILY, Disp: 45 tablet, Rfl: 1   nitroGLYCERIN (NITROSTAT) 0.4 MG SL tablet, Place 1 tablet (0.4 mg total) under the tongue every 5 (five) minutes x 3 doses as needed for chest pain., Disp: 25 tablet, Rfl: 12   oxycodone (ROXICODONE) 30 MG immediate release tablet, Take 30 mg by mouth 3 (three) times daily as needed for pain., Disp: , Rfl:    pantoprazole (PROTONIX) 40 MG tablet, Take 1 tablet (40 mg total) by mouth daily., Disp: 30 tablet, Rfl: 2   polyethylene glycol powder (GLYCOLAX/MIRALAX) 17 GM/SCOOP powder, Take 1 Container by mouth once., Disp: , Rfl:    potassium chloride SA (KLOR-CON M) 20 MEQ tablet, Take 1 tablet (20 mEq total) by mouth as needed (only on the days that you take the Lasix)., Disp: 30 tablet, Rfl: 3   triamcinolone ointment (KENALOG) 0.5 %, Apply 1 Application topically 2 (two) times daily., Disp: 30 g, Rfl: 0   Allergies: Allergies  Allergen Reactions   Morphine Rash    REVIEW OF SYSTEMS:   Review of Systems  Constitutional:  Negative for chills, fatigue and fever.  HENT:   Negative for lump/mass, mouth sores, nosebleeds, sore throat and trouble swallowing.   Eyes:  Negative for eye problems.  Respiratory:  Negative for cough and shortness of breath.   Cardiovascular:  Negative for chest pain, leg swelling and palpitations.  Gastrointestinal:  Negative for abdominal pain,  constipation, diarrhea, nausea and vomiting.  Genitourinary:  Negative for bladder incontinence, difficulty urinating, dysuria, frequency, hematuria and nocturia.   Musculoskeletal:  Negative  for arthralgias, back pain, flank pain, myalgias and neck pain.  Skin:  Negative for itching and rash.  Neurological:  Negative for dizziness, headaches and numbness.  Hematological:  Does not bruise/bleed easily.  Psychiatric/Behavioral:  Negative for depression, sleep disturbance and suicidal ideas. The patient is not nervous/anxious.   All other systems reviewed and are negative.    VITALS:   There were no vitals taken for this visit.  Wt Readings from Last 3 Encounters:  01/08/23 153 lb (69.4 kg)  12/03/22 147 lb 6.4 oz (66.9 kg)  11/19/22 149 lb 3.2 oz (67.7 kg)    There is no height or weight on file to calculate BMI.   PHYSICAL EXAM:   Physical Exam Vitals and nursing note reviewed. Exam conducted with a chaperone present.  Constitutional:      Appearance: Normal appearance.  Cardiovascular:     Rate and Rhythm: Normal rate and regular rhythm.     Pulses: Normal pulses.     Heart sounds: Normal heart sounds.  Pulmonary:     Effort: Pulmonary effort is normal.     Breath sounds: Normal breath sounds.  Abdominal:     Palpations: Abdomen is soft. There is no hepatomegaly, splenomegaly or mass.     Tenderness: There is no abdominal tenderness.  Musculoskeletal:     Right lower leg: No edema.     Left lower leg: No edema.  Lymphadenopathy:     Cervical: No cervical adenopathy.     Right cervical: No superficial, deep or posterior cervical adenopathy.    Left cervical: No superficial, deep or posterior cervical adenopathy.     Upper Body:     Right upper body: No supraclavicular or axillary adenopathy.     Left upper body: No supraclavicular or axillary adenopathy.  Neurological:     General: No focal deficit present.     Mental Status: She is alert and oriented to person,  place, and time.  Psychiatric:        Mood and Affect: Mood normal.        Behavior: Behavior normal.     LABS:      Latest Ref Rng & Units 01/06/2023    3:05 PM 11/24/2022    1:27 PM 07/06/2022    2:09 PM  CBC  WBC 4.0 - 10.5 K/uL 5.6  4.8  5.4   Hemoglobin 12.0 - 15.0 g/dL 40.9  81.1  91.4   Hematocrit 36.0 - 46.0 % 41.4  41.6  43.1   Platelets 150 - 400 K/uL 149  191  177       Latest Ref Rng & Units 01/06/2023    3:05 PM 11/24/2022    1:27 PM 07/06/2022    2:09 PM  CMP  Glucose 70 - 99 mg/dL 782  86  89   BUN 8 - 23 mg/dL 17  11  15    Creatinine 0.44 - 1.00 mg/dL 9.56  2.13  0.86   Sodium 135 - 145 mmol/L 136  144  139   Potassium 3.5 - 5.1 mmol/L 3.4  3.8  3.6   Chloride 98 - 111 mmol/L 101  103  100   CO2 22 - 32 mmol/L 25  28  30    Calcium 8.9 - 10.3 mg/dL 8.6  9.3  9.1   Total Protein 6.5 - 8.1 g/dL 6.5  6.4  6.8   Total Bilirubin <1.2 mg/dL 0.6  0.4  0.4   Alkaline Phos 38 - 126 U/L 113  200  108   AST 15 - 41 U/L 80  116  38   ALT 0 - 44 U/L 82  148  40      No results found for: "CEA1", "CEA" / No results found for: "CEA1", "CEA" No results found for: "PSA1" No results found for: "YQM578" No results found for: "CAN125"  No results found for: "TOTALPROTELP", "ALBUMINELP", "A1GS", "A2GS", "BETS", "BETA2SER", "GAMS", "MSPIKE", "SPEI" Lab Results  Component Value Date   TIBC 273 08/26/2021   FERRITIN 29 08/26/2021   IRONPCTSAT 24 08/26/2021   Lab Results  Component Value Date   LDH 154 01/06/2023   LDH 150 07/06/2022     STUDIES:   No results found.

## 2023-01-13 NOTE — Telephone Encounter (Signed)
Patient called to cancel appointments for today due to leg swelling and pain that kept her up all night.  This was obtained by VM message.  Have attempted to contact her 3 times and received no answer.  Message left on VM for a call back to assess symptoms further.

## 2023-01-22 DIAGNOSIS — R079 Chest pain, unspecified: Secondary | ICD-10-CM | POA: Diagnosis not present

## 2023-01-22 DIAGNOSIS — I252 Old myocardial infarction: Secondary | ICD-10-CM | POA: Diagnosis not present

## 2023-01-22 DIAGNOSIS — M1990 Unspecified osteoarthritis, unspecified site: Secondary | ICD-10-CM | POA: Diagnosis not present

## 2023-01-22 DIAGNOSIS — N289 Disorder of kidney and ureter, unspecified: Secondary | ICD-10-CM | POA: Diagnosis not present

## 2023-01-22 DIAGNOSIS — E785 Hyperlipidemia, unspecified: Secondary | ICD-10-CM | POA: Diagnosis not present

## 2023-01-22 DIAGNOSIS — M549 Dorsalgia, unspecified: Secondary | ICD-10-CM | POA: Diagnosis not present

## 2023-01-22 DIAGNOSIS — R0789 Other chest pain: Secondary | ICD-10-CM | POA: Diagnosis not present

## 2023-01-22 DIAGNOSIS — R7989 Other specified abnormal findings of blood chemistry: Secondary | ICD-10-CM | POA: Diagnosis not present

## 2023-01-22 DIAGNOSIS — F17210 Nicotine dependence, cigarettes, uncomplicated: Secondary | ICD-10-CM | POA: Diagnosis not present

## 2023-01-22 DIAGNOSIS — I10 Essential (primary) hypertension: Secondary | ICD-10-CM | POA: Diagnosis not present

## 2023-01-22 DIAGNOSIS — I6523 Occlusion and stenosis of bilateral carotid arteries: Secondary | ICD-10-CM | POA: Diagnosis not present

## 2023-01-22 DIAGNOSIS — R202 Paresthesia of skin: Secondary | ICD-10-CM | POA: Diagnosis not present

## 2023-01-22 DIAGNOSIS — R6889 Other general symptoms and signs: Secondary | ICD-10-CM | POA: Diagnosis not present

## 2023-01-22 DIAGNOSIS — Z743 Need for continuous supervision: Secondary | ICD-10-CM | POA: Diagnosis not present

## 2023-01-22 DIAGNOSIS — G8929 Other chronic pain: Secondary | ICD-10-CM | POA: Diagnosis not present

## 2023-02-01 ENCOUNTER — Telehealth: Payer: Self-pay | Admitting: Cardiology

## 2023-02-01 NOTE — Telephone Encounter (Signed)
Patient states she misplaced the written copies of her lab orders and she would like new ones if possible.

## 2023-02-02 ENCOUNTER — Other Ambulatory Visit: Payer: Self-pay | Admitting: Family Medicine

## 2023-02-02 NOTE — Telephone Encounter (Signed)
Lab orders have been reprinted and mailed to patient home address

## 2023-02-08 DIAGNOSIS — M961 Postlaminectomy syndrome, not elsewhere classified: Secondary | ICD-10-CM | POA: Diagnosis not present

## 2023-02-08 DIAGNOSIS — Z79891 Long term (current) use of opiate analgesic: Secondary | ICD-10-CM | POA: Diagnosis not present

## 2023-02-08 DIAGNOSIS — M51362 Other intervertebral disc degeneration, lumbar region with discogenic back pain and lower extremity pain: Secondary | ICD-10-CM | POA: Diagnosis not present

## 2023-02-08 DIAGNOSIS — M25522 Pain in left elbow: Secondary | ICD-10-CM | POA: Diagnosis not present

## 2023-02-08 DIAGNOSIS — M25562 Pain in left knee: Secondary | ICD-10-CM | POA: Diagnosis not present

## 2023-02-08 DIAGNOSIS — M545 Low back pain, unspecified: Secondary | ICD-10-CM | POA: Diagnosis not present

## 2023-02-08 DIAGNOSIS — G894 Chronic pain syndrome: Secondary | ICD-10-CM | POA: Diagnosis not present

## 2023-02-08 DIAGNOSIS — Z9189 Other specified personal risk factors, not elsewhere classified: Secondary | ICD-10-CM | POA: Diagnosis not present

## 2023-02-09 ENCOUNTER — Telehealth: Payer: Self-pay | Admitting: *Deleted

## 2023-02-09 NOTE — Telephone Encounter (Signed)
Patient called in requesting to her lab orders from November mailed to her so she can have done. I have done so.

## 2023-02-12 ENCOUNTER — Encounter: Payer: Self-pay | Admitting: Gastroenterology

## 2023-02-15 ENCOUNTER — Other Ambulatory Visit (HOSPITAL_COMMUNITY)
Admission: RE | Admit: 2023-02-15 | Discharge: 2023-02-15 | Disposition: A | Discharge status: Home / Self Care | Payer: Medicare Other | Source: Ambulatory Visit | Attending: Gastroenterology | Admitting: Gastroenterology

## 2023-02-15 ENCOUNTER — Other Ambulatory Visit (HOSPITAL_COMMUNITY)
Admission: RE | Admit: 2023-02-15 | Discharge: 2023-02-15 | Disposition: A | Discharge status: Home / Self Care | Payer: Medicare Other | Source: Ambulatory Visit | Attending: Nurse Practitioner | Admitting: Nurse Practitioner

## 2023-02-15 DIAGNOSIS — K5903 Drug induced constipation: Secondary | ICD-10-CM | POA: Insufficient documentation

## 2023-02-15 DIAGNOSIS — I2101 ST elevation (STEMI) myocardial infarction involving left main coronary artery: Secondary | ICD-10-CM | POA: Diagnosis not present

## 2023-02-15 DIAGNOSIS — N179 Acute kidney failure, unspecified: Secondary | ICD-10-CM | POA: Insufficient documentation

## 2023-02-15 DIAGNOSIS — R7989 Other specified abnormal findings of blood chemistry: Secondary | ICD-10-CM | POA: Insufficient documentation

## 2023-02-15 DIAGNOSIS — E785 Hyperlipidemia, unspecified: Secondary | ICD-10-CM | POA: Diagnosis not present

## 2023-02-15 DIAGNOSIS — I5022 Chronic systolic (congestive) heart failure: Secondary | ICD-10-CM | POA: Diagnosis not present

## 2023-02-15 LAB — COMPREHENSIVE METABOLIC PANEL
ALT: 210 U/L — ABNORMAL HIGH (ref 0–44)
AST: 178 U/L — ABNORMAL HIGH (ref 15–41)
Albumin: 3.8 g/dL (ref 3.5–5.0)
Alkaline Phosphatase: 112 U/L (ref 38–126)
Anion gap: 7 (ref 5–15)
BUN: 15 mg/dL (ref 8–23)
CO2: 28 mmol/L (ref 22–32)
Calcium: 9.1 mg/dL (ref 8.9–10.3)
Chloride: 104 mmol/L (ref 98–111)
Creatinine, Ser: 0.97 mg/dL (ref 0.44–1.00)
GFR, Estimated: 60 mL/min — ABNORMAL LOW (ref >60)
Glucose, Bld: 91 mg/dL (ref 70–99)
Potassium: 4.4 mmol/L (ref 3.5–5.1)
Sodium: 139 mmol/L (ref 135–145)
Total Bilirubin: 0.7 mg/dL (ref <1.2)
Total Protein: 7 g/dL (ref 6.5–8.1)

## 2023-02-15 LAB — LIPID PANEL
Cholesterol: 144 mg/dL (ref 0–200)
HDL: 58 mg/dL (ref >40)
LDL Cholesterol: 77 mg/dL (ref 0–99)
Total CHOL/HDL Ratio: 2.5 {ratio}
Triglycerides: 45 mg/dL (ref <150)
VLDL: 9 mg/dL (ref 0–40)

## 2023-02-15 LAB — HEPATIC FUNCTION PANEL
ALT: 207 U/L — ABNORMAL HIGH (ref 0–44)
AST: 180 U/L — ABNORMAL HIGH (ref 15–41)
Albumin: 3.8 g/dL (ref 3.5–5.0)
Alkaline Phosphatase: 112 U/L (ref 38–126)
Bilirubin, Direct: 0.1 mg/dL (ref 0.0–0.2)
Indirect Bilirubin: 0.5 mg/dL (ref 0.3–0.9)
Total Bilirubin: 0.6 mg/dL (ref <1.2)
Total Protein: 6.8 g/dL (ref 6.5–8.1)

## 2023-02-17 ENCOUNTER — Telehealth: Payer: Self-pay | Admitting: Family Medicine

## 2023-02-17 NOTE — Telephone Encounter (Signed)
DISABILITY PLACARD   Noted  Copied Sleeved  Original in PCP box Copy front desk folder

## 2023-02-25 ENCOUNTER — Other Ambulatory Visit: Payer: Self-pay | Admitting: *Deleted

## 2023-02-25 ENCOUNTER — Other Ambulatory Visit: Payer: Self-pay

## 2023-02-25 DIAGNOSIS — E785 Hyperlipidemia, unspecified: Secondary | ICD-10-CM

## 2023-02-25 DIAGNOSIS — R7989 Other specified abnormal findings of blood chemistry: Secondary | ICD-10-CM

## 2023-02-26 NOTE — Progress Notes (Unsigned)
   Established Patient Office Visit   Subjective  Patient ID: [name], female    DOB: 09-27-1945  Age: 77 y.o. MRN: [id number]  No chief complaint on file.   She  has a past medical history of Arthritis, CAD (coronary artery disease), CHF (congestive heart failure) (HCC), non-Hodgkin's lymphoma, Hyperlipidemia, Hypertension, and Left ventricular apical thrombus.  HPI  ROS    Objective:     There were no vitals taken for this visit. {Vitals History (Optional):23777}  Physical Exam   No results found for any visits on 03/01/23.  The ASCVD Risk score (Arnett DK, et al., 2019) failed to calculate for the following reasons:   Risk score cannot be calculated because patient has a medical history suggesting prior/existing ASCVD    Assessment & Plan:  There are no diagnoses linked to this encounter.  No follow-ups on file.   Jodi Lederer Newman Nip, FNP

## 2023-02-26 NOTE — Patient Instructions (Signed)

## 2023-03-01 ENCOUNTER — Ambulatory Visit (INDEPENDENT_AMBULATORY_CARE_PROVIDER_SITE_OTHER): Payer: Medicare Other | Admitting: Family Medicine

## 2023-03-01 ENCOUNTER — Encounter: Payer: Self-pay | Admitting: Family Medicine

## 2023-03-01 VITALS — BP 102/66 | HR 87 | Ht 63.5 in | Wt 146.0 lb

## 2023-03-01 DIAGNOSIS — R52 Pain, unspecified: Secondary | ICD-10-CM

## 2023-03-01 DIAGNOSIS — R4701 Aphasia: Secondary | ICD-10-CM | POA: Insufficient documentation

## 2023-03-01 DIAGNOSIS — Z23 Encounter for immunization: Secondary | ICD-10-CM

## 2023-03-01 LAB — MISC LABCORP TEST (SEND OUT): Labcorp test code: 1612

## 2023-03-01 NOTE — Assessment & Plan Note (Signed)
Referral placed to Neurology for further evaluation Discussed Practice Communication Daily, Use simple words or phrases to express yourself., write down key points to aid communication, practice naming objects or forming sentences regularly.

## 2023-03-02 ENCOUNTER — Inpatient Hospital Stay: Payer: Medicare Other | Attending: Hematology | Admitting: Hematology

## 2023-03-02 VITALS — BP 121/81 | HR 80 | Temp 98.0°F | Resp 18 | Wt 147.8 lb

## 2023-03-02 DIAGNOSIS — C8205 Follicular lymphoma grade I, lymph nodes of inguinal region and lower limb: Secondary | ICD-10-CM

## 2023-03-02 DIAGNOSIS — F17210 Nicotine dependence, cigarettes, uncomplicated: Secondary | ICD-10-CM | POA: Diagnosis not present

## 2023-03-02 NOTE — Patient Instructions (Signed)
 [facility] at [facility] Discharge Instructions   You were seen and examined today by Dr. Ellin Saba.  He reviewed the results of your lab work which are normal/stable.   We will see you back in 1 year. We will repeat lab work prior to this visit.   Return as scheduled.    Thank you for choosing [facility] at [facility] to provide your oncology and hematology care.  To afford each patient quality time with our provider, please arrive at least 15 minutes before your scheduled appointment time.   If you have a lab appointment with the Cancer Center please come in thru the Main Entrance and check in at the main information desk.  You need to re-schedule your appointment should you arrive 10 or more minutes late.  We strive to give you quality time with our providers, and arriving late affects you and other patients whose appointments are after yours.  Also, if you no show three or more times for appointments you may be dismissed from the clinic at the providers discretion.     Again, thank you for choosing [facility]  Our hope is that these requests will decrease the amount of time that you wait before being seen by our physicians.       _____________________________________________________________  Should you have questions after your visit to [facility], please contact our office at [phone] and follow the prompts.  Our office hours are 8:00 a.m. and 4:30 p.m. Monday - Friday.  Please note that voicemails left after 4:00 p.m. may not be returned until the following business day.  We are closed weekends and major holidays.  You do have access to a nurse 24-7, just call the main number to the clinic [phone] and do not press any options, hold on the line and a nurse will answer the phone.    For prescription refill requests, have your pharmacy contact our office and allow 72 hours.    Due to Covid, you will  need to wear a mask upon entering the hospital. If you do not have a mask, a mask will be given to you at the Main Entrance upon arrival. For doctor visits, patients may have 1 support person age 63 or older with them. For treatment visits, patients can not have anyone with them due to social distancing guidelines and our immunocompromised population.

## 2023-03-02 NOTE — Progress Notes (Signed)
 [facility] [address] [place] [address]   Clinic Day:  03/02/2023  Referring physician: [name], Ilian*  Patient Care Team: Del Wilhelmena Falter, Hilario, FNP as PCP - General (Family Medicine) Alvan Dorn FALCON, MD as PCP - Cardiology (Cardiology) Rogers Hai, MD as Medical Oncologist (Medical Oncology)   ASSESSMENT & PLAN:   Assessment:  1.  Stage I grade 1 follicular lymphoma: - Diagnosed with follicular lymphoma of the left groin lymph node on 10/27/2013.  Lymph node was incidentally found on Doppler of the lower extremity for swelling. - Status post XRT completed in December 2015 at [facility]. - Recently had right groin mass biopsy which was benign.  2.  Social/family history: - She is currently living with her daughter and is independent of ADLs and IADLs.  She moved back from [place] to [place] in the last 1 year.  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  Quit smoking last week, smoked half pack per day to 3/4 pack/day for 60 years.  She will only smoked 3 to 4 cigarettes/day in the last 12 years. - Sister died of stomach cancer.  Paternal aunt died of breast cancer.  Plan:  1.  Stage I follicular lymphoma: - She denies any fevers, night sweats or weight loss about 6 months. - Denies any infections since last visit. - Examination today was benign with no adenopathy or splenomegaly. - Reviewed labs from 01/06/2023: AST and ALT are elevated in the 80s.  Rest of LFTs are normal.  LDH is normal.  CBC was normal. - She is having workup done for elevated LFTs including labs and ultrasound which were ordered.  She is also on atorvastatin . - No evidence of recurrence at this time.  Follow-up in 1 year with labs and exam.  Orders Placed This Encounter  Procedures   CBC with Differential    Standing Status:   Future    Expected Date:   02/28/2024    Expiration Date:   03/01/2024    Comprehensive metabolic panel    Standing Status:   Future    Expected Date:   02/28/2024    Expiration Date:   03/01/2024   Lactate dehydrogenase    Standing Status:   Future    Expected Date:   02/28/2024    Expiration Date:   03/01/2024      LILLETTE Hummingbird R Teague,acting as a scribe for Hai Rogers, MD.,have documented all relevant documentation on the behalf of Hai Rogers, MD,as directed by  Hai Rogers, MD while in the presence of Hai Rogers, MD.  I, Hai Rogers MD, have reviewed the above documentation for accuracy and completeness, and I agree with the above.    Hai Rogers, MD   12/31/20242:55 PM  CHIEF COMPLAINT/PURPOSE OF CONSULT:   Diagnosis: low grade follicular lymphoma  Prior Therapy: radiation to left inguinal nodes, completed 02/27/14  Current Therapy:  surveillance  HISTORY OF PRESENT ILLNESS:   [name] is a 77 y.o. female presenting to clinic today for evaluation of low grade follicular lymphoma at the request of Dr. Terry Dempsey Falter.  She was initially diagnosed with low grade follicular lymphoma in 09/2013 after presenting with enlarged inguinal lymph nodes. She was referred to Dr. Chinnasami in hematology and Dr. Blake in radiation oncology. She completed radiation to the left inguinal nodes on 02/27/14. She has been in remission since that time with no evidence of recurrence.  Today, she states that she is doing well overall. Her appetite level is at 100%. Her energy level is at 50%.  INTERVAL HISTORY:   [name] is a 77 y.o. female presenting to the clinic today for follow-up of low grade follicular lymphoma. She was last seen by me on 07/06/22 in consultation.  Since her last visit, she was seen in the ED on 01/22/23 for chest pain and elevated LFTs and was told to hold atorvastatin .  Today, she states that she is doing well overall. Her appetite level is at 90%. Her energy level is at 80%. She  denies any fevers, night sweats, or unexpected weight loss. She restarted atorvastatin  20 mg after she was discharged. She has a follow up for elevated liver enzymes with labs and US  ordered.   PAST MEDICAL HISTORY:   Past Medical History: Past Medical History:  Diagnosis Date   Arthritis    CAD (coronary artery disease)    a. cath in 07/2021 showing severe multivessel CAD --> not felt to be a CABG candidate and limited viable myocardium --> received DESx2 to LAD   CHF (congestive heart failure) (HCC)    a. EF 30-35% by echo in 07/2021, normalized to 60-65% by repeat imaging later that month.   Chronic pain disorder 05/10/2017   Hx of non-Hodgkin's lymphoma    Hyperlipidemia    Hypertension    Left ventricular apical thrombus    a. noted on cMRI in 07/2021   Low back pain 08/28/2022    Surgical History: Past Surgical History:  Procedure Laterality Date   CORONARY STENT INTERVENTION N/A 08/08/2021   Procedure: CORONARY STENT INTERVENTION;  Surgeon: Wonda Sharper, MD;  Location: [facility] INVASIVE CV LAB;  Service: Cardiovascular;  Laterality: N/A;   CORONARY ULTRASOUND/IVUS N/A 08/08/2021   Procedure: Intravascular Ultrasound/IVUS;  Surgeon: Wonda Sharper, MD;  Location: [facility] INVASIVE CV LAB;  Service: Cardiovascular;  Laterality: N/A;   LEFT HEART CATH AND CORONARY ANGIOGRAPHY N/A 08/02/2021   Procedure: LEFT HEART CATH AND CORONARY ANGIOGRAPHY;  Surgeon: Mady Bruckner, MD;  Location: [facility] INVASIVE CV LAB;  Service: Cardiovascular;  Laterality: N/A;    Social History: Social History   Socioeconomic History   Marital status: Widowed    Spouse name: Not on file   Number of children: Not on file   Years of education: Not on file   Highest education level: Not on file  Occupational History   Not on file  Tobacco Use   Smoking status: Every Day    Current packs/day: 0.00    Types: Cigarettes, E-cigarettes    Last attempt to quit: 07/03/2022    Years since quitting: 0.6    Passive  exposure: Never   Smokeless tobacco: Never   Tobacco comments:    Quit 07/03/2022 07/08/22 Tay  Vaping Use   Vaping status: Never Used  Substance and Sexual Activity   Alcohol use: Not Currently   Drug use: Never   Sexual activity: Not on file  Other Topics Concern   Not on file  Social History Narrative   Not on file   Social Drivers of Health   Financial Resource Strain: Low Risk  (11/11/2022)   Overall Financial Resource Strain (CARDIA)    Difficulty of Paying Living Expenses: Not hard at all  Food Insecurity: No Food Insecurity (11/11/2022)   Hunger Vital Sign    Worried About Running Out of Food in the Last Year: Never true    Ran Out of Food in the  Last Year: Never true  Transportation Needs: No Transportation Needs (11/11/2022)   PRAPARE - Administrator, Civil Service (Medical): No    Lack of Transportation (Non-Medical): No  Physical Activity: Inactive (11/11/2022)   Exercise Vital Sign    Days of Exercise per Week: 0 days    Minutes of Exercise per Session: 0 min  Stress: Stress Concern Present (11/11/2022)   [employer] of Occupational Health - Occupational Stress Questionnaire    Feeling of Stress : Rather much  Social Connections: Moderately Integrated (11/11/2022)   Social Connection and Isolation Panel [NHANES]    Frequency of Communication with Friends and Family: More than three times a week    Frequency of Social Gatherings with Friends and Family: More than three times a week    Attends Religious Services: More than 4 times per year    Active Member of [employer] or Organizations: Yes    Attends Banker Meetings: More than 4 times per year    Marital Status: Widowed  Intimate Partner Violence: Not At Risk (11/11/2022)   Humiliation, Afraid, Rape, and Kick questionnaire    Fear of Current or Ex-Partner: No    Emotionally Abused: No    Physically Abused: No    Sexually Abused: No    Family History: Family History  Problem Relation  Age of Onset   Aortic aneurysm Mother    Coronary artery disease Brother     Current Medications:  Current Outpatient Medications:    ALPRAZolam  (XANAX ) 0.5 MG tablet, Take 1 tablet (0.5 mg total) by mouth daily as needed., Disp: 30 tablet, Rfl: 0   atorvastatin  (LIPITOR ) 20 MG tablet, Take 1 tablet (20 mg total) by mouth daily., Disp: 90 tablet, Rfl: 1   bisacodyl  (DULCOLAX) 10 MG suppository, Place 1 suppository (10 mg total) rectally daily as needed for moderate constipation., Disp: 12 suppository, Rfl: 0   Calcium  Carb-Cholecalciferol (OYSTER SHELL CALCIUM  W/D) 500-5 MG-MCG TABS, Take by mouth., Disp: , Rfl:    clopidogrel  (PLAVIX ) 75 MG tablet, TAKE 1 TABLET BY MOUTH DAILY, Disp: 90 tablet, Rfl: 3   cyanocobalamin  (,VITAMIN B-12,) 1000 MCG/ML injection, Inject 1 mcg into the muscle every 30 (thirty) days., Disp: , Rfl:    dapagliflozin  propanediol (FARXIGA ) 10 MG TABS tablet, Take 1 tablet (10 mg total) by mouth daily., Disp: 30 tablet, Rfl: 5   diclofenac Sodium (VOLTAREN) 1 % GEL, Apply 2 g topically 4 (four) times daily as needed (pain)., Disp: , Rfl:    DULoxetine  (CYMBALTA ) 30 MG capsule, Take 30 mg by mouth 3 (three) times daily., Disp: , Rfl:    fluticasone  (FLONASE ) 50 MCG/ACT nasal spray, Place 2 sprays into both nostrils daily as needed for allergies., Disp: , Rfl:    furosemide  (LASIX ) 40 MG tablet, Take 1 tablet (40 mg total) by mouth as needed (shortness of breath or weight gain). (Patient taking differently: Take 40 mg by mouth daily as needed (shortness of breath or weight gain).), Disp: 30 tablet, Rfl: 3   gabapentin  (NEURONTIN ) 800 MG tablet, Take 800 mg by mouth 3 (three) times daily., Disp: , Rfl:    losartan  (COZAAR ) 25 MG tablet, TAKE 0.5 TABLETS BY MOUTH DAILY, Disp: 45 tablet, Rfl: 1   nitroGLYCERIN  (NITROSTAT ) 0.4 MG SL tablet, Place 1 tablet (0.4 mg total) under the tongue every 5 (five) minutes x 3 doses as needed for chest pain., Disp: 25 tablet, Rfl: 12    oxycodone  (ROXICODONE ) 30 MG immediate release tablet,  Take 30 mg by mouth 3 (three) times daily as needed for pain., Disp: , Rfl:    pantoprazole  (PROTONIX ) 40 MG tablet, TAKE 1 TABLET BY MOUTH DAILY, Disp: 90 tablet, Rfl: 3   polyethylene glycol powder (GLYCOLAX /MIRALAX ) 17 GM/SCOOP powder, Take 1 Container by mouth once., Disp: , Rfl:    potassium chloride  SA (KLOR-CON  M) 20 MEQ tablet, Take 1 tablet (20 mEq total) by mouth as needed (only on the days that you take the Lasix )., Disp: 30 tablet, Rfl: 3   triamcinolone  ointment (KENALOG ) 0.5 %, Apply 1 Application topically 2 (two) times daily., Disp: 30 g, Rfl: 0   Allergies: Allergies  Allergen Reactions   Morphine Rash    REVIEW OF SYSTEMS:   Review of Systems  Constitutional:  Negative for chills, fatigue and fever.  HENT:   Negative for lump/mass, mouth sores, nosebleeds, sore throat and trouble swallowing.   Eyes:  Negative for eye problems.  Respiratory:  Positive for shortness of breath. Negative for cough.   Cardiovascular:  Positive for chest pain. Negative for leg swelling and palpitations.  Gastrointestinal:  Positive for constipation. Negative for abdominal pain, diarrhea, nausea and vomiting.  Genitourinary:  Negative for bladder incontinence, difficulty urinating, dysuria, frequency, hematuria and nocturia.   Musculoskeletal:  Positive for arthralgias (in bilateral knees, 6-7/10 severity) and back pain (lower back, 6-7/10 severity). Negative for flank pain, myalgias and neck pain.  Skin:  Negative for itching and rash.  Neurological:  Positive for numbness (in fingers). Negative for dizziness and headaches.  Hematological:  Does not bruise/bleed easily.  Psychiatric/Behavioral:  Positive for depression and sleep disturbance. Negative for suicidal ideas. The patient is nervous/anxious.   All other systems reviewed and are negative.    VITALS:   Blood pressure 121/81, pulse 80, temperature 98 F (36.7 C), temperature  source Oral, resp. rate 18, weight 147 lb 12.8 oz (67 kg), SpO2 100%.  Wt Readings from Last 3 Encounters:  03/02/23 147 lb 12.8 oz (67 kg)  03/01/23 146 lb (66.2 kg)  01/08/23 153 lb (69.4 kg)    Body mass index is 25.77 kg/m.   PHYSICAL EXAM:   Physical Exam Vitals and nursing note reviewed. Exam conducted with a chaperone present.  Constitutional:      Appearance: Normal appearance.  Cardiovascular:     Rate and Rhythm: Normal rate and regular rhythm.     Pulses: Normal pulses.     Heart sounds: Normal heart sounds.  Pulmonary:     Effort: Pulmonary effort is normal.     Breath sounds: Normal breath sounds.  Abdominal:     Palpations: Abdomen is soft. There is no hepatomegaly, splenomegaly or mass.     Tenderness: There is no abdominal tenderness.  Musculoskeletal:     Right lower leg: No edema.     Left lower leg: No edema.  Lymphadenopathy:     Cervical: No cervical adenopathy.     Right cervical: No superficial, deep or posterior cervical adenopathy.    Left cervical: No superficial, deep or posterior cervical adenopathy.     Upper Body:     Right upper body: No supraclavicular or axillary adenopathy.     Left upper body: No supraclavicular or axillary adenopathy.  Neurological:     General: No focal deficit present.     Mental Status: She is alert and oriented to person, place, and time.  Psychiatric:        Mood and Affect: Mood normal.  Behavior: Behavior normal.     LABS:      Latest Ref Rng & Units 01/06/2023    3:05 PM 11/24/2022    1:27 PM 07/06/2022    2:09 PM  CBC  WBC 4.0 - 10.5 K/uL 5.6  4.8  5.4   Hemoglobin 12.0 - 15.0 g/dL 86.5  86.6  86.3   Hematocrit 36.0 - 46.0 % 41.4  41.6  43.1   Platelets 150 - 400 K/uL 149  191  177       Latest Ref Rng & Units 02/15/2023    3:38 PM 02/15/2023    3:37 PM 01/06/2023    3:05 PM  CMP  Glucose 70 - 99 mg/dL  91  866   BUN 8 - 23 mg/dL  15  17   Creatinine 9.55 - 1.00 mg/dL  9.02  9.06   Sodium  864 - 145 mmol/L  139  136   Potassium 3.5 - 5.1 mmol/L  4.4  3.4   Chloride 98 - 111 mmol/L  104  101   CO2 22 - 32 mmol/L  28  25   Calcium  8.9 - 10.3 mg/dL  9.1  8.6   Total Protein 6.5 - 8.1 g/dL 6.8  7.0  6.5   Total Bilirubin <1.2 mg/dL 0.6  0.7  0.6   Alkaline Phos 38 - 126 U/L 112  112  113   AST 15 - 41 U/L 180  178  80   ALT 0 - 44 U/L 207  210  82      No results found for: CEA1, CEA / No results found for: CEA1, CEA No results found for: PSA1 No results found for: CAN199 No results found for: CAN125  No results found for: TOTALPROTELP, ALBUMINELP, A1GS, A2GS, BETS, BETA2SER, GAMS, MSPIKE, SPEI Lab Results  Component Value Date   TIBC 273 08/26/2021   FERRITIN 29 08/26/2021   IRONPCTSAT 24 08/26/2021   Lab Results  Component Value Date   LDH 154 01/06/2023   LDH 150 07/06/2022     STUDIES:   No results found.

## 2023-03-09 ENCOUNTER — Ambulatory Visit (HOSPITAL_COMMUNITY): Payer: Medicare Other

## 2023-03-10 ENCOUNTER — Ambulatory Visit (HOSPITAL_COMMUNITY): Payer: Medicare Other

## 2023-03-17 ENCOUNTER — Ambulatory Visit (HOSPITAL_COMMUNITY)
Admission: RE | Admit: 2023-03-17 | Discharge: 2023-03-17 | Disposition: A | Discharge status: Home / Self Care | Payer: Medicare Other | Source: Ambulatory Visit | Attending: Gastroenterology | Admitting: Gastroenterology

## 2023-03-17 DIAGNOSIS — R7989 Other specified abnormal findings of blood chemistry: Secondary | ICD-10-CM | POA: Diagnosis not present

## 2023-03-17 DIAGNOSIS — K828 Other specified diseases of gallbladder: Secondary | ICD-10-CM | POA: Diagnosis not present

## 2023-03-22 ENCOUNTER — Encounter: Payer: Self-pay | Admitting: *Deleted

## 2023-04-07 ENCOUNTER — Telehealth: Payer: Self-pay

## 2023-04-07 NOTE — Telephone Encounter (Signed)
 Hey I placed a referral on 03/10/2023 and it says unable to contact are able to proved the number for her to call

## 2023-04-07 NOTE — Telephone Encounter (Signed)
 Copied from CRM [id number]. Topic: Referral - Status >> Apr 06, 2023  4:29 PM Mosetta Putt H wrote: Reason for CRM: Patient is requesting referral to neurologist

## 2023-04-09 ENCOUNTER — Ambulatory Visit (HOSPITAL_COMMUNITY)
Admission: RE | Admit: 2023-04-09 | Discharge: 2023-04-09 | Disposition: A | Discharge status: Home / Self Care | Payer: Medicare Other | Source: Ambulatory Visit | Attending: Acute Care | Admitting: Acute Care

## 2023-04-09 DIAGNOSIS — R911 Solitary pulmonary nodule: Secondary | ICD-10-CM | POA: Insufficient documentation

## 2023-04-09 DIAGNOSIS — R918 Other nonspecific abnormal finding of lung field: Secondary | ICD-10-CM | POA: Diagnosis not present

## 2023-04-09 DIAGNOSIS — I700 Atherosclerosis of aorta: Secondary | ICD-10-CM | POA: Diagnosis not present

## 2023-04-12 NOTE — Telephone Encounter (Signed)
 No referral is showing in my system for that date. Could you please include who you sent the referral to?

## 2023-04-13 NOTE — Telephone Encounter (Signed)
Hey, I placed the referral on 03/01/23 at [facility]

## 2023-04-20 NOTE — Telephone Encounter (Signed)
 Hey  I called [facility] They stated they approved the Referral on 12/31 They attempted to contact pt. On three separate occasions.  12/31 01/07 01/08   They were not able to make contact.  Ma contacted pt. Today and was not able to make contact.  I left location and contact number on pt. Vm for her to contact to schedule  informed her that they have exhausted their contact attempts, that if she wanted to be seen she needed to call them for scheduling.

## 2023-04-22 ENCOUNTER — Encounter: Payer: Self-pay | Admitting: Neurology

## 2023-04-27 ENCOUNTER — Other Ambulatory Visit: Payer: Self-pay | Admitting: Nurse Practitioner

## 2023-05-05 ENCOUNTER — Other Ambulatory Visit (HOSPITAL_COMMUNITY)
Admission: RE | Admit: 2023-05-05 | Discharge: 2023-05-05 | Disposition: A | Discharge status: Home / Self Care | Source: Ambulatory Visit | Attending: Gastroenterology | Admitting: Gastroenterology

## 2023-05-05 ENCOUNTER — Other Ambulatory Visit (HOSPITAL_COMMUNITY)
Admission: RE | Admit: 2023-05-05 | Discharge: 2023-05-05 | Disposition: A | Discharge status: Home / Self Care | Source: Ambulatory Visit | Attending: Family Medicine | Admitting: Family Medicine

## 2023-05-05 DIAGNOSIS — R52 Pain, unspecified: Secondary | ICD-10-CM | POA: Diagnosis not present

## 2023-05-05 DIAGNOSIS — R7989 Other specified abnormal findings of blood chemistry: Secondary | ICD-10-CM | POA: Diagnosis not present

## 2023-05-05 DIAGNOSIS — E785 Hyperlipidemia, unspecified: Secondary | ICD-10-CM | POA: Diagnosis not present

## 2023-05-06 LAB — ANTI-SMOOTH MUSCLE ANTIBODY, IGG: F-Actin IgG: 62 U — ABNORMAL HIGH (ref 0–19)

## 2023-05-06 LAB — ALPHA-1-ANTITRYPSIN: A-1 Antitrypsin, Ser: 184 mg/dL (ref 101–187)

## 2023-05-06 LAB — RHEUMATOID FACTOR: Rheumatoid fact SerPl-aCnc: 12.1 [IU]/mL (ref <14.0)

## 2023-05-06 LAB — ANTI-MICROSOMAL ANTIBODY LIVER / KIDNEY: LKM1 Ab: 0.5 U (ref 0.0–20.0)

## 2023-05-07 ENCOUNTER — Other Ambulatory Visit: Payer: Self-pay | Admitting: Family Medicine

## 2023-05-07 LAB — MISC LABCORP TEST (SEND OUT)
Labcorp test code: 550659
Labcorp test code: 550960

## 2023-05-13 ENCOUNTER — Telehealth: Payer: Self-pay | Admitting: Gastroenterology

## 2023-05-13 ENCOUNTER — Other Ambulatory Visit: Payer: Self-pay | Admitting: Nurse Practitioner

## 2023-05-13 NOTE — Telephone Encounter (Signed)
 Patient scheduled an appt but says she is running out of the medication for constipation and asked if you could send a refill to [facility]... [phone] (fax #)

## 2023-05-17 ENCOUNTER — Ambulatory Visit (INDEPENDENT_AMBULATORY_CARE_PROVIDER_SITE_OTHER): Admitting: Internal Medicine

## 2023-05-17 ENCOUNTER — Encounter: Payer: Self-pay | Admitting: Internal Medicine

## 2023-05-17 ENCOUNTER — Encounter: Payer: Self-pay | Admitting: *Deleted

## 2023-05-17 VITALS — BP 136/74 | HR 70 | Ht 63.0 in | Wt 149.2 lb

## 2023-05-17 DIAGNOSIS — R21 Rash and other nonspecific skin eruption: Secondary | ICD-10-CM | POA: Diagnosis not present

## 2023-05-17 DIAGNOSIS — L989 Disorder of the skin and subcutaneous tissue, unspecified: Secondary | ICD-10-CM

## 2023-05-17 MED ORDER — TRIAMCINOLONE ACETONIDE 0.5 % EX OINT: 1.0000 | TOPICAL_OINTMENT | Freq: Two times a day (BID) | CUTANEOUS | 2 refills | Status: DC

## 2023-05-17 MED ORDER — FLUTICASONE PROPIONATE 50 MCG/ACT NA SUSP: 2.0000 | Freq: Every day | NASAL | 2 refills | Status: DC | PRN

## 2023-05-17 NOTE — Assessment & Plan Note (Signed)
 Presenting today for acute visit endorsing a rash on her right lower extremity.  As otherwise documented, she reports a recurring rash on her lower extremities for many years that she ultimately attributes to a staph infection 30 years ago.  On exam there are scattered macules noted on the right lower extremity.  There is no rash present on her back but there is evidence of excoriation.  Etiology of the rash is unclear but has typically resolved with topical corticosteroids. -Treatment options reviewed.  Patient declines referral to dermatology.  I have refilled Kenalog 0.5% cream for as needed application.  She will return to care if symptoms worsen or fail to improve.  Otherwise, PCP follow-up scheduled for July.

## 2023-05-17 NOTE — Patient Instructions (Signed)
 It was a pleasure to see you today.  Thank you for giving [place] the opportunity to be involved in your care.  Below is a brief recap of your visit and next steps.  We will plan to see you again in July.  Summary Triamcinolone cream refilled today I recommend using a moisturizing cream on your legs as we discussed. Follow up with Tenna Child as scheduled in July

## 2023-05-17 NOTE — Progress Notes (Signed)
 Acute Office Visit  Subjective:     Patient ID: [name], female    DOB: [birth year], 78 y.o.   MRN: [id number]  Chief Complaint  Patient presents with   Rash    Pt. States rash coming up all over her body, thinks its related to a staph infection about 30 years ago and a rash comes up in the area every now and then she states she wants a cream it itches real bad.    Ms. Hurlbutt presents today for an acute visit in the setting of a recurring rash on her lower extremities.  She states that she periodically develops a nonspecific rash that is itching in nature.  The rash resolves with application of steroid cream.  She attributes the rash to a staph infection 30 years ago.  Most recently evaluated for a rash on the left leg in April 2024.  Treated with triamcinolone cream.  Today she endorses a rash on her right shin and is concerned about an itching rash on her back.  The rash on her leg does not significantly itch, but her back does itch significantly.  Review of Systems  Skin:  Positive for itching and rash.      Objective:    BP 136/74   Pulse 70   Ht 5\' 3"  (1.6 m)   Wt 149 lb 3.2 oz (67.7 kg)   SpO2 (!) 88%   BMI 26.43 kg/m  BP Readings from Last 3 Encounters:  05/17/23 136/74  03/02/23 121/81  03/01/23 102/66   Physical Exam Vitals reviewed.  Constitutional:      General: She is not in acute distress.    Appearance: Normal appearance. She is not toxic-appearing.  HENT:     Head: Normocephalic and atraumatic.     Right Ear: External ear normal.     Left Ear: External ear normal.     Nose: Nose normal. No congestion or rhinorrhea.     Mouth/Throat:     Mouth: Mucous membranes are moist.     Pharynx: Oropharynx is clear. No oropharyngeal exudate or posterior oropharyngeal erythema.  Eyes:     General: No scleral icterus.    Extraocular Movements: Extraocular movements intact.     Conjunctiva/sclera: Conjunctivae normal.     Pupils: Pupils are equal, round, and  reactive to light.  Cardiovascular:     Rate and Rhythm: Normal rate and regular rhythm.     Pulses: Normal pulses.     Heart sounds: Normal heart sounds. No murmur heard.    No friction rub. No gallop.  Pulmonary:     Effort: Pulmonary effort is normal.     Breath sounds: Normal breath sounds. No wheezing, rhonchi or rales.  Abdominal:     General: Abdomen is flat. Bowel sounds are normal. There is no distension.     Palpations: Abdomen is soft.     Tenderness: There is no abdominal tenderness.  Musculoskeletal:        General: No swelling. Normal range of motion.     Cervical back: Normal range of motion.     Right lower leg: No edema.     Left lower leg: No edema.  Lymphadenopathy:     Cervical: No cervical adenopathy.  Skin:    General: Skin is warm and dry.     Capillary Refill: Capillary refill takes less than 2 seconds.     Coloration: Skin is not jaundiced.     Findings: Rash (Scattered macules over the  right shin with no evidence of excoriation.  No rash identified on the patient's back.) present.  Neurological:     General: No focal deficit present.     Mental Status: She is alert and oriented to person, place, and time.  Psychiatric:        Mood and Affect: Mood normal.        Behavior: Behavior normal.       Assessment & Plan:   Problem List Items Addressed This Visit       Rash and nonspecific skin eruption - Primary   Presenting today for acute visit endorsing a rash on her right lower extremity.  As otherwise documented, she reports a recurring rash on her lower extremities for many years that she ultimately attributes to a staph infection 30 years ago.  On exam there are scattered macules noted on the right lower extremity.  There is no rash present on her back but there is evidence of excoriation.  Etiology of the rash is unclear but has typically resolved with topical corticosteroids. -Treatment options reviewed.  Patient declines referral to dermatology.  I  have refilled Kenalog 0.5% cream for as needed application.  She will return to care if symptoms worsen or fail to improve.  Otherwise, PCP follow-up scheduled for July.      Meds ordered this encounter  Medications   triamcinolone ointment (KENALOG) 0.5 %    Sig: Apply 1 Application topically 2 (two) times daily.    Dispense:  30 g    Refill:  2   fluticasone (FLONASE) 50 MCG/ACT nasal spray    Sig: Place 2 sprays into both nostrils daily as needed for allergies.    Dispense:  9.9 mL    Refill:  2    Return if symptoms worsen or fail to improve.  Billie Lade, MD

## 2023-05-19 ENCOUNTER — Other Ambulatory Visit: Payer: Self-pay | Admitting: Gastroenterology

## 2023-05-19 ENCOUNTER — Other Ambulatory Visit: Payer: Self-pay | Admitting: Family Medicine

## 2023-05-19 MED ORDER — NALOXEGOL OXALATE 25 MG PO TABS: 25.0000 mg | ORAL_TABLET | Freq: Every day | ORAL | 2 refills | Status: DC

## 2023-05-19 NOTE — Telephone Encounter (Signed)
 Pt called and states that [employer]antik is working for her. She would like to know if there is a stronger dose, if so she would like it sent to her pharmacy. Either way send Movantik to her pharmacy.

## 2023-05-19 NOTE — Telephone Encounter (Signed)
 Copied from CRM [id number]. Topic: Clinical - Medication Refill >> May 19, 2023  9:39 AM [name] wrote:  Most Recent Primary Care Visit:  Provider: Billie Lade  Department: [facility]  Visit Type: OFFICE VISIT  Date: 05/17/2023  Medication:  bisacodyl (DULCOLAX) 30 MG capsules. Patient states she gets 3 of them a day. Patient forgot to ask Dr. Durwin Nora for refill for this prescription and now requesting for PCP to take over this prescription. Requesting 90 day supply.  Patient requesting all prescriptions to be sent to pharmacy below.   Has the patient contacted their pharmacy? Yes  Is this the correct pharmacy for this prescription? Yes  This is the patient's preferred pharmacy:  [facility] - [place] - [address] [place] [address] Phone: [phone] Fax: [phone]   Has the prescription been filled recently? No  Is the patient out of the medication? Yes  Has the patient been seen for an appointment in the last year OR does the patient have an upcoming appointment? Yes  Can we respond through MyChart? Yes  Agent: Please be advised that Rx refills may take up to 3 business days. We ask that you follow-up with your pharmacy.

## 2023-05-19 NOTE — Telephone Encounter (Signed)
 Not our patient

## 2023-05-21 NOTE — Telephone Encounter (Signed)
Noted. Informed pt.  

## 2023-05-24 ENCOUNTER — Encounter: Payer: Self-pay | Admitting: *Deleted

## 2023-05-24 NOTE — Progress Notes (Deleted)
 GI Office Note    Referring Provider: Wylene Men* Primary Care Physician:  Rica Records, FNP Primary Gastroenterologist: *** Date:  05/24/2023  ID:  [name], [name] 1946-02-28, MRN [id number]   Chief Complaint   No chief complaint on file.   History of Present Illness  [name] is a 78 y.o. female with a history of *** presenting today with complaint of   Colonoscopy in December 2020: -3 polyps ranging 3-6 mm in the cecum, ascending, and descending colon.  -pathology revealed 1 sessile serrated adenoma and 2 tubular adenomas.   Last office visit 12/03/2022.***.     Latest Ref Rng & Units 02/15/2023    3:38 PM 02/15/2023    3:37 PM 01/06/2023    3:05 PM 11/24/2022    1:27 PM  CMP  Glucose 70 - 99 mg/dL  91  409  86   BUN 8 - 23 mg/dL  15  17  11    Creatinine 0.44 - 1.00 mg/dL  8.11  9.14  7.82   Sodium 135 - 145 mmol/L  139  136  144   Potassium 3.5 - 5.1 mmol/L  4.4  3.4  3.8   Chloride 98 - 111 mmol/L  104  101  103   CO2 22 - 32 mmol/L  28  25  28    Calcium 8.9 - 10.3 mg/dL  9.1  8.6  9.3   Total Protein 6.5 - 8.1 g/dL 6.8  7.0  6.5  6.4   Total Bilirubin <1.2 mg/dL 0.6  0.7  0.6  0.4   Alkaline Phos 38 - 126 U/L 112  112  113  200   AST 15 - 41 U/L 180  178  80  116   ALT 0 - 44 U/L 207  210  82  148    [place] with elastography 03/17/2023: - kPa 3.7 with good IQR -No evidence of mass -Distended gallbladder without evidence of cholelithiasis or biliary dilation  Labs 05/05/2023: Rheumatoid factor negative.  Elf 11.61 (H), FibroSure with F1-F2 fibrosis, S2/S3 moderate to severe steatosis, and 3 severe NASH again with elevated ALT to 81, AST 150, GGT 116 with normal triglycerides.  ASMA elevated to 62 (60 previously).  Normal antiliver kidney antibody and A1a.  Per discussion with medical science liaison with Rezdiffra given conflicting NIT results including ultrasound elastography, neck step would be liver biopsy to determine cirrhosis  versus advanced to see if she would be a candidate for Rezdiffra.  The recommendation for liver biopsy given conflicting NIT results as given by the [facility].   Patient called back recently last week and reported Movantik is working well for her, requested new prescription and stronger dose if possible.  Today:    Wt Readings from Last 3 Encounters:  05/17/23 149 lb 3.2 oz (67.7 kg)  03/02/23 147 lb 12.8 oz (67 kg)  03/01/23 146 lb (66.2 kg)    Current Outpatient Medications  Medication Sig Dispense Refill   ALPRAZolam (XANAX) 0.5 MG tablet Take 1 tablet (0.5 mg total) by mouth daily as needed. 30 tablet 0   atorvastatin (LIPITOR) 20 MG tablet Take 1 tablet (20 mg total) by mouth daily. 90 tablet 1   bisacodyl (DULCOLAX) 10 MG suppository Place 1 suppository (10 mg total) rectally daily as needed for moderate constipation. 12 suppository 0   Calcium Carb-Cholecalciferol (OYSTER SHELL CALCIUM W/D) 500-5 MG-MCG TABS Take by mouth.     clopidogrel (PLAVIX) 75 MG  tablet TAKE 1 TABLET BY MOUTH DAILY 90 tablet 3   cyanocobalamin (,VITAMIN B-12,) 1000 MCG/ML injection Inject 1 mcg into the muscle every 30 (thirty) days.     diclofenac Sodium (VOLTAREN) 1 % GEL Apply 2 g topically 4 (four) times daily as needed (pain).     DULoxetine (CYMBALTA) 30 MG capsule Take 30 mg by mouth 3 (three) times daily.     FARXIGA 10 MG TABS tablet TAKE ONE TABLET BY MOUTH EVERY DAY 30 tablet 5   fluticasone (FLONASE) 50 MCG/ACT nasal spray Place 2 sprays into both nostrils daily as needed for allergies. 9.9 mL 2   furosemide (LASIX) 40 MG tablet Take 1 tablet (40 mg total) by mouth as needed (shortness of breath or weight gain). (Patient taking differently: Take 40 mg by mouth daily as needed (shortness of breath or weight gain).) 30 tablet 3   gabapentin (NEURONTIN) 800 MG tablet Take 800 mg by mouth 3 (three) times daily.     losartan (COZAAR) 25 MG tablet TAKE 0.5 TABLETS BY MOUTH DAILY 45 tablet 1   naloxegol  oxalate (MOVANTIK) 25 MG TABS tablet Take 1 tablet (25 mg total) by mouth daily. 30 tablet 2   nitroGLYCERIN (NITROSTAT) 0.4 MG SL tablet Place 1 tablet (0.4 mg total) under the tongue every 5 (five) minutes x 3 doses as needed for chest pain. 25 tablet 12   oxycodone (ROXICODONE) 30 MG immediate release tablet Take 30 mg by mouth 3 (three) times daily as needed for pain.     pantoprazole (PROTONIX) 40 MG tablet TAKE 1 TABLET BY MOUTH DAILY 90 tablet 3   polyethylene glycol powder (GLYCOLAX/MIRALAX) 17 GM/SCOOP powder Take 1 Container by mouth once.     potassium chloride SA (KLOR-CON M) 20 MEQ tablet Take 1 tablet (20 mEq total) by mouth as needed (only on the days that you take the Lasix). 30 tablet 3   triamcinolone ointment (KENALOG) 0.5 % Apply 1 Application topically 2 (two) times daily. 30 g 2   No current facility-administered medications for this visit.    Past Medical History:  Diagnosis Date   Arthritis    CAD (coronary artery disease)    a. cath in 07/2021 showing severe multivessel CAD --> not felt to be a CABG candidate and limited viable myocardium --> received DESx2 to LAD   CHF (congestive heart failure) (HCC)    a. EF 30-35% by echo in 07/2021, normalized to 60-65% by repeat imaging later that month.   Chronic pain disorder 05/10/2017   Hx of non-Hodgkin's lymphoma    Hyperlipidemia    Hypertension    Left ventricular apical thrombus    a. noted on cMRI in 07/2021   Low back pain 08/28/2022    Past Surgical History:  Procedure Laterality Date   CORONARY STENT INTERVENTION N/A 08/08/2021   Procedure: CORONARY STENT INTERVENTION;  Surgeon: Tonny Bollman, MD;  Location: [facility] INVASIVE CV LAB;  Service: Cardiovascular;  Laterality: N/A;   CORONARY ULTRASOUND/IVUS N/A 08/08/2021   Procedure: Intravascular Ultrasound/IVUS;  Surgeon: Tonny Bollman, MD;  Location: [facility] INVASIVE CV LAB;  Service: Cardiovascular;  Laterality: N/A;   LEFT HEART CATH AND CORONARY ANGIOGRAPHY N/A  08/02/2021   Procedure: LEFT HEART CATH AND CORONARY ANGIOGRAPHY;  Surgeon: Yvonne Kendall, MD;  Location: [facility] INVASIVE CV LAB;  Service: Cardiovascular;  Laterality: N/A;    Family History  Problem Relation Age of Onset   Aortic aneurysm Mother    Coronary artery disease Brother  Allergies as of 05/25/2023 - Review Complete 05/17/2023  Allergen Reaction Noted   Morphine Rash 08/22/2014    Social History   Socioeconomic History   Marital status: Widowed    Spouse name: Not on file   Number of children: Not on file   Years of education: Not on file   Highest education level: Not on file  Occupational History   Not on file  Tobacco Use   Smoking status: Every Day    Current packs/day: 0.00    Types: Cigarettes, E-cigarettes    Last attempt to quit: 07/03/2022    Years since quitting: 0.8    Passive exposure: Never   Smokeless tobacco: Never   Tobacco comments:    Quit 07/03/2022 07/08/22 [place]  Vaping Use   Vaping status: Never Used  Substance and Sexual Activity   Alcohol use: Not Currently   Drug use: Never   Sexual activity: Not on file  Other Topics Concern   Not on file  Social History Narrative   Not on file   Social Drivers of Health   Financial Resource Strain: Low Risk  (11/11/2022)   Overall Financial Resource Strain (CARDIA)    Difficulty of Paying Living Expenses: Not hard at all  Food Insecurity: No Food Insecurity (11/11/2022)   Hunger Vital Sign    Worried About Running Out of Food in the Last Year: Never true    Ran Out of Food in the Last Year: Never true  Transportation Needs: No Transportation Needs (11/11/2022)   PRAPARE - Administrator, Civil Service (Medical): No    Lack of Transportation (Non-Medical): No  Physical Activity: Inactive (11/11/2022)   Exercise Vital Sign    Days of Exercise per Week: 0 days    Minutes of Exercise per Session: 0 min  Stress: Stress Concern Present (11/11/2022)   [employer] of Occupational Health  - Occupational Stress Questionnaire    Feeling of Stress : Rather much  Social Connections: Moderately Integrated (11/11/2022)   Social Connection and Isolation Panel [NHANES]    Frequency of Communication with Friends and Family: More than three times a week    Frequency of Social Gatherings with Friends and Family: More than three times a week    Attends Religious Services: More than 4 times per year    Active Member of [employer] or Organizations: Yes    Attends Banker Meetings: More than 4 times per year    Marital Status: Widowed     Review of Systems   Gen: Denies fever, chills, anorexia. Denies fatigue, weakness, weight loss.  CV: Denies chest pain, palpitations, syncope, peripheral edema, and claudication. Resp: Denies dyspnea at rest, cough, wheezing, coughing up blood, and pleurisy. GI: See HPI Derm: Denies rash, itching, dry skin Psych: Denies depression, anxiety, memory loss, confusion. No homicidal or suicidal ideation.  Heme: Denies bruising, bleeding, and enlarged lymph nodes.  Physical Exam   There were no vitals taken for this visit.  General:   Alert and oriented. No distress noted. Pleasant and cooperative.  Head:  Normocephalic and atraumatic. Eyes:  Conjuctiva clear without scleral icterus. Mouth:  Oral mucosa pink and moist. Good dentition. No lesions. Lungs:  Clear to auscultation bilaterally. No wheezes, rales, or rhonchi. No distress.  Heart:  S1, S2 present without murmurs appreciated.  Abdomen:  +BS, soft, non-tender and non-distended. No rebound or guarding. No HSM or masses noted. Rectal: *** Msk:  Symmetrical without gross deformities. Normal posture. Extremities:  Without edema. Neurologic:  Alert and  oriented x4 Psych:  Alert and cooperative. Normal mood and affect.  Assessment  [name] is a 78 y.o. female with a history of *** presenting today with   MASH, elevated LFTs:  GERD:  Opioid-induced constipation:  PLAN    *** Consider liver biopsy LFTs in 3 months Continue Movantik 25 mg daily Continue fiber daily Continue pantoprazole 40 mg once daily    Brooke Bonito, MSN, FNP-BC, AGACNP-BC [facility]

## 2023-05-25 ENCOUNTER — Ambulatory Visit: Admitting: Gastroenterology

## 2023-05-26 ENCOUNTER — Ambulatory Visit: Payer: Medicare Other | Admitting: Cardiology

## 2023-05-26 NOTE — Progress Notes (Deleted)
 Clinical Summary [name] is a 78 y.o.female  1.CAD -admit 07/2021 with late presenting STEMI received DES xt to LAD, residual disease managed medically. If recurrent symptoms could consider intervention   - no recent chest pains. Mild infrequent SOB     2. 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    - farxiga is too expensive, she has stopped taking - has lasix 40mg  prn, takes about once a month     4. HLD 08/2022 TC 174 TG 47 HDL 70 LDL 95  - elevated LFTs on atorva 40mg , tolerates 20mg  daily -    5. HTN - after MI issues with low bp's. Her coreg, entersto, aldactone, chlorthalidone - home bp's variable, had a day of SBP 170 but otherwise 100-130s. 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 cath in 07/2021 showing severe multivessel CAD --> not felt to be a CABG candidate and limited viable myocardium --> received DESx2 to LAD   CHF (congestive heart failure) (HCC)    a. EF 30-35% by echo in 07/2021, normalized to 60-65% by repeat imaging later that month.   Chronic pain disorder 05/10/2017   Hx of non-Hodgkin's lymphoma    Hyperlipidemia    Hypertension    Left ventricular apical thrombus    a. noted on cMRI in 07/2021   Low back pain 08/28/2022     Allergies  Allergen Reactions   Morphine Rash     Current Outpatient Medications  Medication Sig Dispense Refill   ALPRAZolam (XANAX) 0.5 MG tablet Take 1 tablet (0.5 mg total) by mouth daily as needed. 30 tablet 0   atorvastatin (LIPITOR) 20 MG  tablet Take 1 tablet (20 mg total) by mouth daily. 90 tablet 1   bisacodyl (DULCOLAX) 10 MG suppository Place 1 suppository (10 mg total) rectally daily as needed for moderate constipation. 12 suppository 0   Calcium Carb-Cholecalciferol (OYSTER SHELL CALCIUM W/D) 500-5 MG-MCG TABS Take by mouth.     clopidogrel (PLAVIX) 75 MG tablet TAKE 1 TABLET BY MOUTH DAILY 90 tablet 3   cyanocobalamin (,VITAMIN B-12,) 1000 MCG/ML injection Inject 1 mcg into the muscle every 30 (thirty) days.     diclofenac Sodium (VOLTAREN) 1 % GEL Apply 2 g topically 4 (four) times daily as needed (pain).     DULoxetine (CYMBALTA) 30 MG capsule Take 30 mg by mouth 3 (three) times daily.     FARXIGA 10 MG TABS tablet TAKE ONE TABLET BY MOUTH EVERY DAY 30 tablet 5   fluticasone (FLONASE) 50 MCG/ACT nasal spray Place 2 sprays into both nostrils daily as needed for allergies. 9.9 mL 2   furosemide (LASIX) 40 MG tablet Take 1 tablet (40 mg total) by mouth as needed (shortness of breath or weight gain). (Patient taking differently: Take 40 mg by mouth daily as needed (shortness of breath or weight gain).) 30 tablet 3   gabapentin (NEURONTIN) 800 MG tablet Take 800 mg by mouth 3 (three) times daily.  losartan (COZAAR) 25 MG tablet TAKE 0.5 TABLETS BY MOUTH DAILY 45 tablet 1   naloxegol oxalate (MOVANTIK) 25 MG TABS tablet Take 1 tablet (25 mg total) by mouth daily. 30 tablet 2   nitroGLYCERIN (NITROSTAT) 0.4 MG SL tablet Place 1 tablet (0.4 mg total) under the tongue every 5 (five) minutes x 3 doses as needed for chest pain. 25 tablet 12   oxycodone (ROXICODONE) 30 MG immediate release tablet Take 30 mg by mouth 3 (three) times daily as needed for pain.     pantoprazole (PROTONIX) 40 MG tablet TAKE 1 TABLET BY MOUTH DAILY 90 tablet 3   polyethylene glycol powder (GLYCOLAX/MIRALAX) 17 GM/SCOOP powder Take 1 Container by mouth once.     potassium chloride SA (KLOR-CON M) 20 MEQ tablet Take 1 tablet (20 mEq total) by mouth as needed  (only on the days that you take the Lasix). 30 tablet 3   triamcinolone ointment (KENALOG) 0.5 % Apply 1 Application topically 2 (two) times daily. 30 g 2   No current facility-administered medications for this visit.     Past Surgical History:  Procedure Laterality Date   CORONARY STENT INTERVENTION N/A 08/08/2021   Procedure: CORONARY STENT INTERVENTION;  Surgeon: Tonny Bollman, MD;  Location: [facility] INVASIVE CV LAB;  Service: Cardiovascular;  Laterality: N/A;   CORONARY ULTRASOUND/IVUS N/A 08/08/2021   Procedure: Intravascular Ultrasound/IVUS;  Surgeon: Tonny Bollman, MD;  Location: [facility] INVASIVE CV LAB;  Service: Cardiovascular;  Laterality: N/A;   LEFT HEART CATH AND CORONARY ANGIOGRAPHY N/A 08/02/2021   Procedure: LEFT HEART CATH AND CORONARY ANGIOGRAPHY;  Surgeon: Yvonne Kendall, MD;  Location: [facility] INVASIVE CV LAB;  Service: Cardiovascular;  Laterality: N/A;     Allergies  Allergen Reactions   Morphine Rash      Family History  Problem Relation Age of Onset   Aortic aneurysm Mother    Coronary artery disease Brother      Social History [name] reports that she has been smoking cigarettes and e-cigarettes. She has never been exposed to tobacco smoke. She has never used smokeless tobacco. [name] reports that she does not currently use alcohol.   Review of Systems CONSTITUTIONAL: No weight loss, fever, chills, weakness or fatigue.  HEENT: Eyes: No visual loss, blurred vision, double vision or yellow sclerae.No hearing loss, sneezing, congestion, runny nose or sore throat.  SKIN: No rash or itching.  CARDIOVASCULAR:  RESPIRATORY: No shortness of breath, cough or sputum.  GASTROINTESTINAL: No anorexia, nausea, vomiting or diarrhea. No abdominal pain or blood.  GENITOURINARY: No burning on urination, no polyuria NEUROLOGICAL: No headache, dizziness, syncope, paralysis, ataxia, numbness or tingling in the extremities. No change in bowel or bladder control.   MUSCULOSKELETAL: No muscle, back pain, joint pain or stiffness.  LYMPHATICS: No enlarged nodes. No history of splenectomy.  PSYCHIATRIC: No history of depression or anxiety.  ENDOCRINOLOGIC: No reports of sweating, cold or heat intolerance. No polyuria or polydipsia.  Marland Kitchen   Physical Examination There were no vitals filed for this visit. There were no vitals filed for this visit.  Gen: resting comfortably, no acute distress HEENT: no scleral icterus, pupils equal round and reactive, no palptable cervical adenopathy,  CV Resp: Clear to auscultation bilaterally GI: abdomen is soft, non-tender, non-distended, normal bowel sounds, no hepatosplenomegaly MSK: extremities are warm, no edema.  Skin: warm, no rash Neuro:  no focal deficits Psych: appropriate affect   Diagnostic Studies     Assessment and Plan   1.CAD No recent symptoms -  over 1 year since stent. Since long area of stenting the proximal LAD will favor plavix monotherapy as opposed to ASA lone, we will d/c asa and continue plavix EKG today SR, chronic ST/T changes   2. HTN - at goal, rare high home bp's but clinic bp's are primarily at goal. Her bp today is low normal, would not titrate meds   3. HLD  LDL above goal, increase atorvastatin 40mg  daily           Antoine Poche, M.D., F.A.C.C.

## 2023-06-04 ENCOUNTER — Other Ambulatory Visit: Payer: Self-pay | Admitting: Nurse Practitioner

## 2023-06-07 ENCOUNTER — Ambulatory Visit: Admitting: Gastroenterology

## 2023-06-07 ENCOUNTER — Encounter: Payer: Self-pay | Admitting: Gastroenterology

## 2023-06-07 VITALS — BP 178/78 | HR 61 | Temp 98.4°F | Ht 63.0 in | Wt 148.4 lb

## 2023-06-07 DIAGNOSIS — K5903 Drug induced constipation: Secondary | ICD-10-CM

## 2023-06-07 DIAGNOSIS — K219 Gastro-esophageal reflux disease without esophagitis: Secondary | ICD-10-CM

## 2023-06-07 DIAGNOSIS — T402X5A Adverse effect of other opioids, initial encounter: Secondary | ICD-10-CM

## 2023-06-07 DIAGNOSIS — R7989 Other specified abnormal findings of blood chemistry: Secondary | ICD-10-CM | POA: Diagnosis not present

## 2023-06-07 NOTE — Progress Notes (Signed)
 GI Office Note    Referring Provider: [name], Ilian* Primary Care Physician:  Rosanna Comment, FNP Primary Gastroenterologist: Rolando Cliche. [name], DO  Date:  06/07/2023  ID:  [name], DOB Oct 29, 1945, MRN [id number]   Chief Complaint   Chief Complaint  Patient presents with   Follow-up    Follow up on constipation with the medication   History of Present Illness  [name] is a 78 y.o. female with a history of CAD s/p stenting x 2 to LAD in June 2023, CHF, non-Hodgkin's lymphoma, arthritis, HTN, and constipation presenting today for follow-up on constipation.  Last colonoscopy in December 2020: -3 polyps ranging 3-6 mm in the cecum, ascending, and descending colon.  -pathology revealed 1 sessile serrated adenoma and 2 tubular adenomas.    Labs 03/11/22: CBC normal. A1c 5.6. Normal Lipid panel. Hep C Ab and HIV negative. Vit D normal. TSH normal. Cr 1.1, GFR 52, Na 146, alk phos 146, AST 62, ALT 61    Last office visit 05/04/2022.  Taking smooth lax 1 capful 2-3 times per week and fiber 2-3 capsules daily.  Given when she has her other medications she has trouble remembering to take the smooth lax.  Has intermittent abdominal pain she suspects is related to her medications and improves after bowel movements.  She does use suppositories about once every 2 weeks if needed.  Reflux controlled with pantoprazole 40 mg once daily.  Stated liver enzymes were normal until atorvastatin dose doubled.  Denied any herbal supplements, tattoos, blood transfusions, or NSAIDs.  No regular alcohol use.  Advised to follow fatty liver diet.  Serologies checked including autoimmune serologies and viral hepatitis labs and complete RUQ US .  Advise continue to hold atorvastatin for now and then consider resuming low dose in the future.  Advised to continue MiraLAX 1 capful daily and pantoprazole 40 mg daily.   Labs 05/18/2022: GFR 57, creatinine 1.01, glucose 103.  Sodium 137.  AST,  ALT, and alk phos within normal limits.  Negative for viral hepatitis.  ANA, AMA, IgG, IgA, and IgM negative. ASMA positive.    RUQ US  05/18/2022: -No cholelithiasis or acute cholecystitis -CBD 4.4 mm -No focal liver lesion identified  Last office visit 12/03/22.  Admittedly forgetting to take her MiraLAX as she does not like to take it because of the volume.  Continues to struggle with constipation.  Going at least a week without a bowel movement on average.  Usually remembers to take fiber pills.  Sometimes stools are formed sometimes not, consistency based on dietary choice.  Continues to take chronic pain medication, usually 3-4/day as well as her gabapentin.  Also admits to having trouble with memory issues recently.  Had been restarted on atorvastatin just recently due to elevated LDL.  Given trial of Movantik 12.5 mg daily.  Advised to continue fiber pills.  Advise repeat LFTs in 6 weeks.  Consider reducing atorvastatin dose.  Continue pantoprazole 40 mg once daily.  RUQ US  with elastography 03/17/2023: - Mild gallbladder distention without evidence of gallstones - K PA 3.7 - No liver mass present.     Latest Ref Rng & Units 02/15/2023    3:38 PM 02/15/2023    3:37 PM 01/06/2023    3:05 PM 11/24/2022    1:27 PM  CMP  Glucose 70 - 99 mg/dL  91  563  86   BUN 8 - 23 mg/dL  15  17  11    Creatinine  0.44 - 1.00 mg/dL  8.29  5.62  1.30   Sodium 135 - 145 mmol/L  139  136  144   Potassium 3.5 - 5.1 mmol/L  4.4  3.4  3.8   Chloride 98 - 111 mmol/L  104  101  103   CO2 22 - 32 mmol/L  28  25  28    Calcium 8.9 - 10.3 mg/dL  9.1  8.6  9.3   Total Protein 6.5 - 8.1 g/dL 6.8  7.0  6.5  6.4   Total Bilirubin <1.2 mg/dL 0.6  0.7  0.6  0.4   Alkaline Phos 38 - 126 U/L 112  112  113  200   AST 15 - 41 U/L 180  178  80  116   ALT 0 - 44 U/L 207  210  82  148    Labs in March with elevated ELF score and mild fibrosis on FibroSure.  ASMA also elevated but can be present with fatty liver.   Mediterranean diet recommended.  Given conflicting results on NITs, per AASLD guidelines, in order to determine fibrosis and rule out cirrhosis, liver biopsy is recommended.  3/17 patient reported that Movantik was working well for her but requested a stronger dose if possible.  Today:  So far the medication  (movantik) is working well for her thus far. Has been on the medication for about 2 weeks. Sometimes goes everyday and sometimes may skip a day. Can tell a difference with the medication however as it is easier to go. Denies melena or brbpr. No overt abdominal pain.   Blood pressures have been high more and more recently - did not feel nervous. Does not need her lasix often. Tries to watch her sodium intake.   Reflux well controlled. No N/V, dysphagia.   Wt Readings from Last 3 Encounters:  06/07/23 148 lb 6.4 oz (67.3 kg)  05/17/23 149 lb 3.2 oz (67.7 kg)  03/02/23 147 lb 12.8 oz (67 kg)   Current Outpatient Medications  Medication Sig Dispense Refill   ALPRAZolam (XANAX) 0.5 MG tablet Take 1 tablet (0.5 mg total) by mouth daily as needed. 30 tablet 0   atorvastatin (LIPITOR) 20 MG tablet Take 1 tablet (20 mg total) by mouth daily. 90 tablet 1   bisacodyl (DULCOLAX) 10 MG suppository Place 1 suppository (10 mg total) rectally daily as needed for moderate constipation. 12 suppository 0   Calcium Carb-Cholecalciferol (OYSTER SHELL CALCIUM W/D) 500-5 MG-MCG TABS Take by mouth.     clopidogrel (PLAVIX) 75 MG tablet TAKE 1 TABLET BY MOUTH DAILY 90 tablet 1   cyanocobalamin (,VITAMIN B-12,) 1000 MCG/ML injection Inject 1 mcg into the muscle every 30 (thirty) days.     diclofenac Sodium (VOLTAREN) 1 % GEL Apply 2 g topically 4 (four) times daily as needed (pain).     DULoxetine (CYMBALTA) 30 MG capsule Take 30 mg by mouth 3 (three) times daily.     FARXIGA 10 MG TABS tablet TAKE ONE TABLET BY MOUTH EVERY DAY 30 tablet 5   fluticasone (FLONASE) 50 MCG/ACT nasal spray Place 2 sprays into  both nostrils daily as needed for allergies. 9.9 mL 2   furosemide (LASIX) 40 MG tablet Take 1 tablet (40 mg total) by mouth as needed (shortness of breath or weight gain). (Patient taking differently: Take 40 mg by mouth daily as needed (shortness of breath or weight gain).) 30 tablet 3   gabapentin (NEURONTIN) 800 MG tablet Take 800 mg by mouth  3 (three) times daily.     losartan (COZAAR) 25 MG tablet TAKE 0.5 TABLETS BY MOUTH DAILY 45 tablet 1   naloxegol oxalate (MOVANTIK) 25 MG TABS tablet Take 1 tablet (25 mg total) by mouth daily. 30 tablet 2   nitroGLYCERIN (NITROSTAT) 0.4 MG SL tablet Place 1 tablet (0.4 mg total) under the tongue every 5 (five) minutes x 3 doses as needed for chest pain. 25 tablet 12   oxycodone (ROXICODONE) 30 MG immediate release tablet Take 30 mg by mouth 3 (three) times daily as needed for pain.     pantoprazole (PROTONIX) 40 MG tablet TAKE 1 TABLET BY MOUTH DAILY 90 tablet 3   polyethylene glycol powder (GLYCOLAX/MIRALAX) 17 GM/SCOOP powder Take 1 Container by mouth once.     potassium chloride SA (KLOR-CON M) 20 MEQ tablet Take 1 tablet (20 mEq total) by mouth as needed (only on the days that you take the Lasix). 30 tablet 3   triamcinolone ointment (KENALOG) 0.5 % Apply 1 Application topically 2 (two) times daily. 30 g 2   No current facility-administered medications for this visit.    Past Medical History:  Diagnosis Date   Arthritis    CAD (coronary artery disease)    a. cath in 07/2021 showing severe multivessel CAD --> not felt to be a CABG candidate and limited viable myocardium --> received DESx2 to LAD   CHF (congestive heart failure) (HCC)    a. EF 30-35% by echo in 07/2021, normalized to 60-65% by repeat imaging later that month.   Chronic pain disorder 05/10/2017   Hx of non-Hodgkin's lymphoma    Hyperlipidemia    Hypertension    Left ventricular apical thrombus    a. noted on cMRI in 07/2021   Low back pain 08/28/2022    Past Surgical  History:  Procedure Laterality Date   CORONARY STENT INTERVENTION N/A 08/08/2021   Procedure: CORONARY STENT INTERVENTION;  Surgeon: Arnoldo Lapping, MD;  Location: [facility] INVASIVE CV LAB;  Service: Cardiovascular;  Laterality: N/A;   CORONARY ULTRASOUND/IVUS N/A 08/08/2021   Procedure: Intravascular Ultrasound/IVUS;  Surgeon: Arnoldo Lapping, MD;  Location: [facility] INVASIVE CV LAB;  Service: Cardiovascular;  Laterality: N/A;   LEFT HEART CATH AND CORONARY ANGIOGRAPHY N/A 08/02/2021   Procedure: LEFT HEART CATH AND CORONARY ANGIOGRAPHY;  Surgeon: Sammy Crisp, MD;  Location: [facility] INVASIVE CV LAB;  Service: Cardiovascular;  Laterality: N/A;    Family History  Problem Relation Age of Onset   Aortic aneurysm Mother    Coronary artery disease Brother     Allergies as of 06/07/2023 - Review Complete 06/07/2023  Allergen Reaction Noted   Morphine Rash 08/22/2014    Social History   Socioeconomic History   Marital status: Widowed    Spouse name: Not on file   Number of children: Not on file   Years of education: Not on file   Highest education level: Not on file  Occupational History   Not on file  Tobacco Use   Smoking status: Every Day    Current packs/day: 0.00    Types: Cigarettes, E-cigarettes    Last attempt to quit: 07/03/2022    Years since quitting: 0.9    Passive exposure: Never   Smokeless tobacco: Never   Tobacco comments:    Quit 07/03/2022 07/08/22 Tay  Vaping Use   Vaping status: Never Used  Substance and Sexual Activity   Alcohol use: Not Currently   Drug use: Never   Sexual activity: Not on file  Other Topics Concern   Not on file  Social History Narrative   Not on file   Social Drivers of Health   Financial Resource Strain: Low Risk  (11/11/2022)   Overall Financial Resource Strain (CARDIA)    Difficulty of Paying Living Expenses: Not hard at all  Food Insecurity: No Food Insecurity (11/11/2022)   Hunger Vital Sign    Worried About Running Out of Food in the Last  Year: Never true    Ran Out of Food in the Last Year: Never true  Transportation Needs: No Transportation Needs (11/11/2022)   PRAPARE - Administrator, Civil Service (Medical): No    Lack of Transportation (Non-Medical): No  Physical Activity: Inactive (11/11/2022)   Exercise Vital Sign    Days of Exercise per Week: 0 days    Minutes of Exercise per Session: 0 min  Stress: Stress Concern Present (11/11/2022)   [employer] of Occupational Health - Occupational Stress Questionnaire    Feeling of Stress : Rather much  Social Connections: Moderately Integrated (11/11/2022)   Social Connection and Isolation Panel [NHANES]    Frequency of Communication with Friends and Family: More than three times a week    Frequency of Social Gatherings with Friends and Family: More than three times a week    Attends Religious Services: More than 4 times per year    Active Member of [employer] or Organizations: Yes    Attends Banker Meetings: More than 4 times per year    Marital Status: Widowed     Review of Systems   Gen: Denies fever, chills, anorexia. Denies fatigue, weakness, weight loss.  CV: Denies chest pain, palpitations, syncope, peripheral edema, and claudication. Resp: Denies dyspnea at rest, cough, wheezing, coughing up blood, and pleurisy. GI: See HPI Derm: Denies rash, itching, dry skin Psych: + Memory loss and forgetfulness.  Denies depression, anxiety. No homicidal or suicidal ideation.  Heme: Denies bruising, bleeding, and enlarged lymph nodes.  Physical Exam   BP (!) 178/78   Pulse 61   Temp 98.4 F (36.9 C)   Ht 5\' 3"  (1.6 m)   Wt 148 lb 6.4 oz (67.3 kg)   BMI 26.29 kg/m   General:   Alert and oriented. No distress noted. Pleasant and cooperative.  Head:  Normocephalic and atraumatic. Eyes:  Conjuctiva clear without scleral icterus. Mouth:  Oral mucosa pink and moist. Good dentition. No lesions. Lungs:  Clear to auscultation bilaterally. No  wheezes, rales, or rhonchi. No distress.  Heart:  S1, S2 present without murmurs appreciated.  Abdomen:  +BS, soft, non-tender and non-distended. No rebound or guarding. No HSM or masses noted. Rectal: deferred Msk:  Symmetrical without gross deformities. Normal posture. Extremities:  Without edema. Neurologic:  Alert and  oriented x4 Psych:  Alert and cooperative. Normal mood and affect.  Assessment  [name] is a 78 y.o. female with a history of CAD s/p stenting x 2 to LAD in June 2023, CHF, non-Hodgkin's lymphoma, arthritis, HTN, and constipation presenting today for follow-up on constipation and discuss further evaluation of elevated liver enzymes.  Opioid induced Constipation: Maintained on chronic pain medication.  Forgot to take MiraLAX on a daily basis, found this difficult to continue.  Taking fiber supplementation daily.  Currently doing well on Movantik, dose increased from 12.5 mg to 25 mg prior to office visit.  Has been taking for 2 weeks, going at least every other day at minimum.  Definitely easier to go,  less straining.  Denies abdominal pain, melena, or BRBPR.  GERD: Well-controlled on pantoprazole 40 mg once daily.  Denies frequent breakthrough symptoms.  Denies nausea, vomiting, or dysphagia.  Elevated LFTs: Etiology unclear. Elevated ELF score (11.61)  with F1-F2 fibrosis on fibrosure with ongoing elevation in LFTs.  ASMA also positive which can be seen in the setting of fatty liver.  Previous evaluation of elevated alk phos with 73% being liver, 27% bone, and 0% from intestine.  Ultrasound with elastography with kPA low. Discussed guideline recommendations regarding liver biopsy to further assess for etiology of elevated LFTs, separate written education provided.  Advised patient to consider this in the future.  Will repeat LFTs in 3 months.  Discussed Mediterranean diet.  Discussed with patient that in order to consider raise differential, need to pursue liver biopsy to  further assess degree of fibrosis and rule out cirrhosis.  Given her hypertension today in the office, advised her to follow-up with cardiology.  PLAN   Consider liver biopsy - separate education provided Mediterranean diet Follow up with cardiology regarding BP Continue Movantik 25 mg daily Dulcolax as needed Continue pantoprazole 40 mg once daily Repeat LFTs in 3 months Follow up 3 months  Julian Obey, MSN, FNP-BC, AGACNP-BC [facility]

## 2023-06-07 NOTE — Patient Instructions (Addendum)
 Please look over the information I am providing for you about liver biopsy.  This is the best way for [place] to determine the degree of fatty liver that you have and make sure you do not having underlying cirrhosis.  This will also help [place] determine whether or not you are a candidate for the medication that could help reverse fatty liver.  After reviewing the education attached today and what we discussed in the office visit, please let me know if you would like to proceed with liver biopsy.  Continue pantoprazole 40 mg once daily.  I would like to recheck your liver enzymes again in 3 months.  Follow a Mediterranean diet is most helpful for your liver.  Continue taking Movantik 25 mg once daily.  You may use Dulcolax suppositories if needed.  If you are not having more regular bowel movements with Movantik or having some abdominal discomfort you can add MiraLAX to your regimen along with Movantik, otherwise continue with just oral medication.  Follow up in 3 months.   It was a pleasure to see you today. I want to create trusting relationships with patients. If you receive a survey regarding your visit,  I greatly appreciate you taking time to fill this out on paper or through your MyChart. I value your feedback.  Brooke Bonito, MSN, FNP-BC, AGACNP-BC [facility]

## 2023-06-08 DIAGNOSIS — Z79891 Long term (current) use of opiate analgesic: Secondary | ICD-10-CM | POA: Diagnosis not present

## 2023-06-08 DIAGNOSIS — M545 Low back pain, unspecified: Secondary | ICD-10-CM | POA: Diagnosis not present

## 2023-06-08 DIAGNOSIS — M25522 Pain in left elbow: Secondary | ICD-10-CM | POA: Diagnosis not present

## 2023-06-08 DIAGNOSIS — Z9189 Other specified personal risk factors, not elsewhere classified: Secondary | ICD-10-CM | POA: Diagnosis not present

## 2023-06-08 DIAGNOSIS — G90529 Complex regional pain syndrome I of unspecified lower limb: Secondary | ICD-10-CM | POA: Diagnosis not present

## 2023-06-08 DIAGNOSIS — M961 Postlaminectomy syndrome, not elsewhere classified: Secondary | ICD-10-CM | POA: Diagnosis not present

## 2023-06-08 DIAGNOSIS — M25562 Pain in left knee: Secondary | ICD-10-CM | POA: Diagnosis not present

## 2023-06-08 DIAGNOSIS — M51362 Other intervertebral disc degeneration, lumbar region with discogenic back pain and lower extremity pain: Secondary | ICD-10-CM | POA: Diagnosis not present

## 2023-06-08 DIAGNOSIS — G894 Chronic pain syndrome: Secondary | ICD-10-CM | POA: Diagnosis not present

## 2023-06-10 ENCOUNTER — Ambulatory Visit: Payer: Medicare Other | Admitting: Neurology

## 2023-06-17 NOTE — Telephone Encounter (Unsigned)
 Copied from CRM [id number]. Topic: Clinical - Medication Refill >> Jun 17, 2023 12:16 PM Tiffany S wrote: Most Recent Primary Care Visit:  Provider: DIXON, PHILLIP E  Department: [facility]  Visit Type: OFFICE VISIT  Date: 05/17/2023  Medication: gabapentin (NEURONTIN) 800 MG tablet [[id number]   Has the patient contacted their pharmacy? Yes (Agent: If no, request that the patient contact the pharmacy for the refill. If patient does not wish to contact the pharmacy document the reason why and proceed with request.) (Agent: If yes, when and what did the pharmacy advise?)  Is this the correct pharmacy for this prescription? Yes If no, delete pharmacy and type the correct one.  This is the patient's preferred pharmacy:   [facility] - [place], [place] - [address] [place] [address] Phone: [phone] Fax: [phone]   Has the prescription been filled recently? Yes  Is the patient out of the medication? Yes  Has the patient been seen for an appointment in the last year OR does the patient have an upcoming appointment? Yes  Can we respond through MyChart? Yes  Agent: Please be advised that Rx refills may take up to 3 business days. We ask that you follow-up with your pharmacy.

## 2023-06-28 ENCOUNTER — Other Ambulatory Visit: Payer: Self-pay | Admitting: Family Medicine

## 2023-06-28 NOTE — Telephone Encounter (Signed)
 Copied from CRM [id number]. Topic: Clinical - Medication Refill >> Jun 28, 2023  4:09 PM Crispin Dolphin wrote: Most Recent Primary Care Visit:  Provider: DIXON, PHILLIP E  Department: [facility] CARE  Visit Type: OFFICE VISIT  Date: 05/17/2023  Medication: switched pcp when moved to [facility]  DULoxetine  (CYMBALTA ) 30 MG capsule gabapentin  (NEURONTIN ) 800 MG tablet - previously requested has not received response    Has the patient contacted their pharmacy? Yes - new provider (Agent: If no, request that the patient contact the pharmacy for the refill. If patient does not wish to contact the pharmacy document the reason why and proceed with request.) (Agent: If yes, when and what did the pharmacy advise?)  Is this the correct pharmacy for this prescription? Yes If no, delete pharmacy and type the correct one.  This is the patient's preferred pharmacy:  [facility] - [place], [place] - [address] [place] [address] Phone: [phone] Fax: [phone]   Has the prescription been filled recently? No - last refilled by previous provider just running out   Is the patient out of the medication? No - almost   Has the patient been seen for an appointment in the last year OR does the patient have an upcoming appointment? Yes  Can we respond through MyChart? Yes  Agent: Please be advised that Rx refills may take up to 3 business days. We ask that you follow-up with your pharmacy.

## 2023-06-29 MED ORDER — GABAPENTIN 800 MG PO TABS: 800.0000 mg | ORAL_TABLET | Freq: Three times a day (TID) | ORAL | 0 refills | Status: DC

## 2023-06-29 MED ORDER — DULOXETINE HCL 30 MG PO CPEP: 30.0000 mg | ORAL_CAPSULE | Freq: Three times a day (TID) | ORAL | 0 refills | Status: DC

## 2023-06-30 ENCOUNTER — Telehealth: Payer: Self-pay

## 2023-06-30 NOTE — Telephone Encounter (Signed)
 Copied from CRM [id number]. Topic: Clinical - Prescription Issue >> Jun 29, 2023  2:03 PM Carlatta H wrote: Reason for CRM: Please call [phone] for [facility]//They have questions regarding DULoxetine  (CYMBALTA ) 30 MG capsule [[id number]] prescription//

## 2023-07-02 NOTE — Telephone Encounter (Signed)
 Spoke to pharmacy , medication sent to patient

## 2023-07-13 ENCOUNTER — Other Ambulatory Visit: Payer: Self-pay | Admitting: Family Medicine

## 2023-07-23 ENCOUNTER — Other Ambulatory Visit: Payer: Self-pay | Admitting: Family Medicine

## 2023-08-13 ENCOUNTER — Other Ambulatory Visit: Payer: Self-pay | Admitting: Nurse Practitioner

## 2023-08-18 ENCOUNTER — Telehealth: Payer: Self-pay | Admitting: Nurse Practitioner

## 2023-08-18 MED ORDER — CLOPIDOGREL BISULFATE 75 MG PO TABS: 75.0000 mg | ORAL_TABLET | Freq: Every day | ORAL | 0 refills | Status: DC

## 2023-08-18 NOTE — Telephone Encounter (Signed)
 Pt's medication was sent to pt's pharmacy as requested. Confirmation received.

## 2023-08-18 NOTE — Telephone Encounter (Signed)
*  STAT* If patient is at the pharmacy, call can be transferred to refill team.   1. Which medications need to be refilled? (please list name of each medication and dose if known) clopidogrel (PLAVIX) 75 MG tablet   2. Which pharmacy/location (including street and city if local pharmacy) is medication to be sent to? [facility] - [place], [place] - [address]   3. Do they need a 30 day or 90 day supply? 90

## 2023-08-20 ENCOUNTER — Telehealth: Payer: Self-pay | Admitting: Family Medicine

## 2023-08-20 NOTE — Telephone Encounter (Signed)
 Placard form  Copied Noted Sleeved Original placed in provider box Copy at front desk folder  Copied from CRM [id number]. Topic: General - Other >> Aug 19, 2023  4:05 PM [name] wrote: Reason for CRM: Patient is requesting a new form to be filled out for a handicap placard, she would like to request 2 of them if she can. She states that she needs a new one because the one she got before is from 2024 and it is not accepted. Please call her when it is available for pickup or if there are any issues per her request.

## 2023-08-23 ENCOUNTER — Encounter: Payer: Self-pay | Admitting: Gastroenterology

## 2023-08-24 ENCOUNTER — Telehealth: Payer: Self-pay | Admitting: Cardiology

## 2023-08-24 NOTE — Telephone Encounter (Signed)
 Spoke with patient and ran by Almarie Crate. Patient needed to contact PCP or if she wanted to have bruise evaluated she can go ER.

## 2023-08-24 NOTE — Telephone Encounter (Signed)
 Pt states she has a bruise on her inner arm and she's not sure if she should do anything to help it go away. Please advise

## 2023-08-26 ENCOUNTER — Telehealth: Payer: Self-pay | Admitting: Family Medicine

## 2023-08-26 ENCOUNTER — Other Ambulatory Visit: Payer: Self-pay | Admitting: Family Medicine

## 2023-08-26 NOTE — Telephone Encounter (Signed)
 Copied from CRM [id number]. Topic: General - Other >> Aug 26, 2023 11:33 AM Thersia C wrote: Reason for CRM: Patient called in regarding handicapped sticker, would like to know if forms are completed today so she can pick those pickup today, would like for someone to give her a call back regarding those,and if unable to get her to leave a voicemail

## 2023-08-26 NOTE — Telephone Encounter (Signed)
 Patient came by office to collect form

## 2023-08-26 NOTE — Telephone Encounter (Signed)
 Completed and in fax/mail box in fax room. Copy sent to scan. Pls have patient collect

## 2023-08-27 NOTE — Telephone Encounter (Signed)
 Pt has collected form

## 2023-09-01 ENCOUNTER — Telehealth: Payer: Self-pay | Admitting: Cardiology

## 2023-09-01 NOTE — Telephone Encounter (Signed)
 Pt c/o BP issue: STAT if pt c/o blurred vision, one-sided weakness or slurred speech.  STAT if BP is GREATER than 180/120 TODAY.  STAT if BP is LESS than 90/60 and SYMPTOMATIC TODAY  1. What is your BP concern? BP elevated   2. Have you taken any BP medication today? Yes  3. What are your last 5 BP readings? 11am:209/97 12pm:162/83 Right now: 169/81   4. Are you having any other symptoms (ex. Dizziness, headache, blurred vision, passed out)? No

## 2023-09-02 ENCOUNTER — Ambulatory Visit: Payer: Medicare Other | Admitting: Family Medicine

## 2023-09-02 NOTE — Telephone Encounter (Signed)
 Discussed BP with patient - suggested that she check her BP 2 hours after her morning BP medication with sitting 5-10 minutes prior.  May check twice a day & bring readings to her visit on Monday with Dr. Alvan.    No other c/o chest pain, dizziness or sob.

## 2023-09-03 DIAGNOSIS — Z7902 Long term (current) use of antithrombotics/antiplatelets: Secondary | ICD-10-CM | POA: Diagnosis not present

## 2023-09-03 DIAGNOSIS — G8929 Other chronic pain: Secondary | ICD-10-CM | POA: Diagnosis not present

## 2023-09-03 DIAGNOSIS — Z79899 Other long term (current) drug therapy: Secondary | ICD-10-CM | POA: Diagnosis not present

## 2023-09-03 DIAGNOSIS — F17210 Nicotine dependence, cigarettes, uncomplicated: Secondary | ICD-10-CM | POA: Diagnosis not present

## 2023-09-03 DIAGNOSIS — R233 Spontaneous ecchymoses: Secondary | ICD-10-CM | POA: Diagnosis not present

## 2023-09-03 DIAGNOSIS — E785 Hyperlipidemia, unspecified: Secondary | ICD-10-CM | POA: Diagnosis not present

## 2023-09-03 DIAGNOSIS — M1990 Unspecified osteoarthritis, unspecified site: Secondary | ICD-10-CM | POA: Diagnosis not present

## 2023-09-03 DIAGNOSIS — Z7982 Long term (current) use of aspirin: Secondary | ICD-10-CM | POA: Diagnosis not present

## 2023-09-03 DIAGNOSIS — M549 Dorsalgia, unspecified: Secondary | ICD-10-CM | POA: Diagnosis not present

## 2023-09-03 DIAGNOSIS — Z885 Allergy status to narcotic agent status: Secondary | ICD-10-CM | POA: Diagnosis not present

## 2023-09-03 DIAGNOSIS — I10 Essential (primary) hypertension: Secondary | ICD-10-CM | POA: Diagnosis not present

## 2023-09-03 DIAGNOSIS — M79605 Pain in left leg: Secondary | ICD-10-CM | POA: Diagnosis not present

## 2023-09-06 ENCOUNTER — Encounter: Payer: Self-pay | Admitting: Cardiology

## 2023-09-06 ENCOUNTER — Ambulatory Visit: Attending: Cardiology | Admitting: Cardiology

## 2023-09-06 VITALS — BP 145/75 | HR 81 | Ht 63.0 in | Wt 146.2 lb

## 2023-09-06 DIAGNOSIS — I2510 Atherosclerotic heart disease of native coronary artery without angina pectoris: Secondary | ICD-10-CM

## 2023-09-06 DIAGNOSIS — I10 Essential (primary) hypertension: Secondary | ICD-10-CM | POA: Diagnosis not present

## 2023-09-06 DIAGNOSIS — R7989 Other specified abnormal findings of blood chemistry: Secondary | ICD-10-CM | POA: Diagnosis not present

## 2023-09-06 DIAGNOSIS — Z79899 Other long term (current) drug therapy: Secondary | ICD-10-CM

## 2023-09-06 DIAGNOSIS — E782 Mixed hyperlipidemia: Secondary | ICD-10-CM | POA: Diagnosis not present

## 2023-09-06 MED ORDER — LOSARTAN POTASSIUM 25 MG PO TABS: 25.0000 mg | ORAL_TABLET | Freq: Every day | ORAL | 1 refills | Status: DC

## 2023-09-06 NOTE — Patient Instructions (Signed)
 Medication Instructions:   Increase Losartan  to 25mg  daily  Continue all other medications.     Labwork:  CMET, FLP - orders given today Please do in 2 weeks  Office will contact with results via phone, letter or mychart.     Testing/Procedures:  none  Follow-Up:  6 months   Any Other Special Instructions Will Be Listed Below (If Applicable).  Please update the office on your BP readings in 2 weeks   If you need a refill on your cardiac medications before your next appointment, please call your pharmacy.

## 2023-09-06 NOTE — Progress Notes (Signed)
 Clinical Summary Ms. Stemler is a 78 y.o.female  1.CAD -admit 07/2021 with late presenting STEMI received DES xt to LAD, residual disease managed medically. If recurrent symptoms could consider intervention   - no chest pains, no SOB/DOE - compliant with meds     2. 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    - farxiga  is too expensive, she has stopped taking - has lasix  40mg  prn, takes about once a month - chronic LE edema     4. HLD 08/2022 TC 174 TG 47 HDL 70 LDL 95 01/2023 TC 144 TG 45 HDL 58 LDL 77 - ongoing workup for elevated LFTs, we have not titrated statin   5. HTN - home bp's 130s-150s/60s-80s   Past Medical History:  Diagnosis Date   Arthritis    CAD (coronary artery disease)    a. cath in 07/2021 showing severe multivessel CAD --> not felt to be a CABG candidate and limited viable myocardium --> received DESx2 to LAD   CHF (congestive heart failure) (HCC)    a. EF 30-35% by echo in 07/2021, normalized to 60-65% by repeat imaging later that month.   Chronic pain disorder 05/10/2017   Hx of non-Hodgkin's lymphoma    Hyperlipidemia    Hypertension    Left ventricular apical thrombus    a. noted on cMRI in 07/2021   Low back pain 08/28/2022     Allergies  Allergen Reactions   Morphine Rash     Current Outpatient Medications  Medication Sig Dispense Refill   ALPRAZolam  (XANAX ) 0.5 MG tablet Take 1 tablet (0.5 mg total) by mouth daily as needed. 30 tablet 0   atorvastatin  (LIPITOR ) 20 MG tablet Take 1 tablet (20 mg total) by mouth daily. 90 tablet 1   bisacodyl  (DULCOLAX) 10 MG suppository Place 1 suppository (10 mg total) rectally daily as needed for moderate constipation. 12  suppository 0   Calcium  Carb-Cholecalciferol (OYSTER SHELL CALCIUM  W/D) 500-5 MG-MCG TABS Take by mouth.     clopidogrel  (PLAVIX ) 75 MG tablet Take 1 tablet (75 mg total) by mouth daily. 100 tablet 0   cyanocobalamin  (,VITAMIN B-12,) 1000 MCG/ML injection Inject 1 mcg into the muscle every 30 (thirty) days.     diclofenac Sodium (VOLTAREN) 1 % GEL Apply 2 g topically 4 (four) times daily as needed (pain).     DULoxetine  (CYMBALTA ) 30 MG capsule TAKE 1 CAPSULE BY MOUTH 3 TIMES  DAILY 90 capsule 11   FARXIGA  10 MG TABS tablet TAKE ONE TABLET BY MOUTH EVERY DAY 30 tablet 5   fluticasone  (FLONASE ) 50 MCG/ACT nasal spray Place 2 sprays into both nostrils daily as needed for allergies. 9.9 mL 2   furosemide  (LASIX ) 40 MG tablet Take 1 tablet (40 mg total) by mouth as needed (shortness of breath or weight gain). (Patient taking differently: Take 40 mg by mouth daily as needed (shortness of breath or weight gain).) 30 tablet 3   gabapentin  (NEURONTIN ) 800 MG tablet TAKE 1 TABLET BY MOUTH 3 TIMES  DAILY 90 tablet 11   losartan  (COZAAR ) 25 MG tablet TAKE 0.5 TABLETS BY MOUTH DAILY 45 tablet 1   naloxegol  oxalate (MOVANTIK ) 25 MG TABS tablet Take  1 tablet (25 mg total) by mouth daily. 30 tablet 2   nitroGLYCERIN  (NITROSTAT ) 0.4 MG SL tablet Place 1 tablet (0.4 mg total) under the tongue every 5 (five) minutes x 3 doses as needed for chest pain. 25 tablet 12   oxycodone  (ROXICODONE ) 30 MG immediate release tablet Take 30 mg by mouth 3 (three) times daily as needed for pain.     pantoprazole  (PROTONIX ) 40 MG tablet TAKE 1 TABLET BY MOUTH DAILY 90 tablet 3   polyethylene glycol powder (GLYCOLAX /MIRALAX ) 17 GM/SCOOP powder Take 1 Container by mouth once.     potassium chloride  SA (KLOR-CON  M) 20 MEQ tablet Take 1 tablet (20 mEq total) by mouth as needed (only on the days that you take the Lasix ). 30 tablet 3   triamcinolone  ointment (KENALOG ) 0.5 % Apply 1 Application topically 2 (two) times daily. 30 g 2   No  current facility-administered medications for this visit.     Past Surgical History:  Procedure Laterality Date   CORONARY STENT INTERVENTION N/A 08/08/2021   Procedure: CORONARY STENT INTERVENTION;  Surgeon: Wonda Sharper, MD;  Location: [facility] INVASIVE CV LAB;  Service: Cardiovascular;  Laterality: N/A;   CORONARY ULTRASOUND/IVUS N/A 08/08/2021   Procedure: Intravascular Ultrasound/IVUS;  Surgeon: Wonda Sharper, MD;  Location: [facility] INVASIVE CV LAB;  Service: Cardiovascular;  Laterality: N/A;   LEFT HEART CATH AND CORONARY ANGIOGRAPHY N/A 08/02/2021   Procedure: LEFT HEART CATH AND CORONARY ANGIOGRAPHY;  Surgeon: Mady Bruckner, MD;  Location: [facility] INVASIVE CV LAB;  Service: Cardiovascular;  Laterality: N/A;     Allergies  Allergen Reactions   Morphine Rash      Family History  Problem Relation Age of Onset   Aortic aneurysm Mother    Coronary artery disease Brother      Social History Ms. Siefken reports that she has been smoking cigarettes and e-cigarettes. She has never been exposed to tobacco smoke. She has never used smokeless tobacco. Ms. Seay reports that she does not currently use alcohol.     Physical Examination Today's Vitals   09/06/23 1444 09/06/23 1450  BP: (!) 142/82 (!) 142/86  Pulse: 81   SpO2: 97%   Weight: 146 lb 3.2 oz (66.3 kg)   Height: 5' 3 (1.6 m)    Body mass index is 25.9 kg/m.  Gen: resting comfortably, no acute distress HEENT: no scleral icterus, pupils equal round and reactive, no palptable cervical adenopathy,  CV: RRR, no m/rg, no jvd Resp: Clear to auscultation bilaterally GI: abdomen is soft, non-tender, non-distended, normal bowel sounds, no hepatosplenomegaly MSK: extremities are warm, no edema.  Skin: warm, no rash Neuro:  no focal deficits Psych: appropriate affect   Assessment and Plan   1.CAD Since long area of stenting the proximal LAD have committed to plavix  monotherapy as opposed to ASA alone, - no recent symptoms,  continue current meds   2. HTN - above goal, increase losartan  to 25mg  daily. Update us  on bp's 2 weeks  3. HLD LDL above goal. Ongoing workup for mildly elevated LFTs, would not titrate statin at this time - repeat lipid panel  4. Elevated LFTs - per GI. There last not mentioned repeating LFTs at this time, we will order    Dorn PHEBE Ross, M.D.

## 2023-09-13 ENCOUNTER — Other Ambulatory Visit: Payer: Self-pay | Admitting: Nurse Practitioner

## 2023-09-15 ENCOUNTER — Other Ambulatory Visit: Payer: Self-pay | Admitting: *Deleted

## 2023-09-15 DIAGNOSIS — K219 Gastro-esophageal reflux disease without esophagitis: Secondary | ICD-10-CM

## 2023-09-15 DIAGNOSIS — R7989 Other specified abnormal findings of blood chemistry: Secondary | ICD-10-CM

## 2023-09-15 NOTE — Progress Notes (Signed)
 Error

## 2023-09-20 ENCOUNTER — Other Ambulatory Visit: Payer: Self-pay | Admitting: Nurse Practitioner

## 2023-09-20 ENCOUNTER — Other Ambulatory Visit (HOSPITAL_COMMUNITY)
Admission: RE | Admit: 2023-09-20 | Discharge: 2023-09-20 | Disposition: A | Discharge status: Home / Self Care | Source: Ambulatory Visit | Attending: Cardiology | Admitting: Cardiology

## 2023-09-20 DIAGNOSIS — R7989 Other specified abnormal findings of blood chemistry: Secondary | ICD-10-CM | POA: Diagnosis not present

## 2023-09-20 DIAGNOSIS — E782 Mixed hyperlipidemia: Secondary | ICD-10-CM | POA: Insufficient documentation

## 2023-09-20 DIAGNOSIS — I2510 Atherosclerotic heart disease of native coronary artery without angina pectoris: Secondary | ICD-10-CM | POA: Diagnosis not present

## 2023-09-20 DIAGNOSIS — I10 Essential (primary) hypertension: Secondary | ICD-10-CM | POA: Insufficient documentation

## 2023-09-20 DIAGNOSIS — Z79899 Other long term (current) drug therapy: Secondary | ICD-10-CM | POA: Insufficient documentation

## 2023-09-20 LAB — COMPREHENSIVE METABOLIC PANEL WITH GFR
ALT: 91 U/L — ABNORMAL HIGH (ref 0–44)
AST: 98 U/L — ABNORMAL HIGH (ref 15–41)
Albumin: 3.7 g/dL (ref 3.5–5.0)
Alkaline Phosphatase: 90 U/L (ref 38–126)
Anion gap: 6 (ref 5–15)
BUN: 15 mg/dL (ref 8–23)
CO2: 27 mmol/L (ref 22–32)
Calcium: 9 mg/dL (ref 8.9–10.3)
Chloride: 105 mmol/L (ref 98–111)
Creatinine, Ser: 0.88 mg/dL (ref 0.44–1.00)
GFR, Estimated: >60 mL/min (ref >60)
Glucose, Bld: 101 mg/dL — ABNORMAL HIGH (ref 70–99)
Potassium: 4 mmol/L (ref 3.5–5.1)
Sodium: 138 mmol/L (ref 135–145)
Total Bilirubin: 0.5 mg/dL (ref 0.0–1.2)
Total Protein: 7.1 g/dL (ref 6.5–8.1)

## 2023-09-20 LAB — LIPID PANEL
Cholesterol: 146 mg/dL (ref 0–200)
HDL: 60 mg/dL (ref >40)
LDL Cholesterol: 78 mg/dL (ref 0–99)
Total CHOL/HDL Ratio: 2.4 ratio
Triglycerides: 38 mg/dL (ref <150)
VLDL: 8 mg/dL (ref 0–40)

## 2023-09-28 ENCOUNTER — Other Ambulatory Visit: Payer: Self-pay | Admitting: Cardiology

## 2023-09-29 ENCOUNTER — Telehealth: Payer: Self-pay | Admitting: Cardiology

## 2023-09-29 NOTE — Telephone Encounter (Signed)
*  STAT* If patient is at the pharmacy, call can be transferred to refill team.   1. Which medications need to be refilled? (please list name of each medication and dose if known)   furosemide  (LASIX ) 40 MG tablet  potassium chloride  SA (KLOR-CON  M) 20 MEQ tablet    2. Would you like to learn more about the convenience, safety, & potential cost savings by using the [facility]? No   3. Are you open to using the [facility] (Type [facility] No   4. Which pharmacy/location (including street and city if local pharmacy) is medication to be sent to? [facility] - [place], [place] - [address]    5. Do they need a 30 day or 90 day supply? 30 day

## 2023-09-30 ENCOUNTER — Ambulatory Visit: Admitting: Gastroenterology

## 2023-09-30 ENCOUNTER — Other Ambulatory Visit (HOSPITAL_BASED_OUTPATIENT_CLINIC_OR_DEPARTMENT_OTHER): Payer: Self-pay

## 2023-09-30 ENCOUNTER — Encounter: Payer: Self-pay | Admitting: Gastroenterology

## 2023-09-30 MED ORDER — FUROSEMIDE 40 MG PO TABS: 40.0000 mg | ORAL_TABLET | ORAL | 3 refills | Status: AC | PRN

## 2023-09-30 MED ORDER — POTASSIUM CHLORIDE CRYS ER 20 MEQ PO TBCR: 20.0000 meq | EXTENDED_RELEASE_TABLET | ORAL | 3 refills | Status: AC | PRN

## 2023-10-01 ENCOUNTER — Other Ambulatory Visit (HOSPITAL_COMMUNITY)
Admission: RE | Admit: 2023-10-01 | Discharge: 2023-10-01 | Disposition: A | Discharge status: Home / Self Care | Source: Ambulatory Visit | Attending: Gastroenterology | Admitting: Gastroenterology

## 2023-10-01 DIAGNOSIS — K219 Gastro-esophageal reflux disease without esophagitis: Secondary | ICD-10-CM | POA: Diagnosis not present

## 2023-10-01 DIAGNOSIS — R7989 Other specified abnormal findings of blood chemistry: Secondary | ICD-10-CM | POA: Insufficient documentation

## 2023-10-01 DIAGNOSIS — R945 Abnormal results of liver function studies: Secondary | ICD-10-CM | POA: Diagnosis not present

## 2023-10-01 LAB — COMPREHENSIVE METABOLIC PANEL WITH GFR
ALT: 114 U/L — ABNORMAL HIGH (ref 0–44)
AST: 134 U/L — ABNORMAL HIGH (ref 15–41)
Albumin: 3.5 g/dL (ref 3.5–5.0)
Alkaline Phosphatase: 112 U/L (ref 38–126)
Anion gap: 12 (ref 5–15)
BUN: 22 mg/dL (ref 8–23)
CO2: 26 mmol/L (ref 22–32)
Calcium: 8.5 mg/dL — ABNORMAL LOW (ref 8.9–10.3)
Chloride: 105 mmol/L (ref 98–111)
Creatinine, Ser: 1.07 mg/dL — ABNORMAL HIGH (ref 0.44–1.00)
GFR, Estimated: 53 mL/min — ABNORMAL LOW (ref >60)
Glucose, Bld: 108 mg/dL — ABNORMAL HIGH (ref 70–99)
Potassium: 3.9 mmol/L (ref 3.5–5.1)
Sodium: 143 mmol/L (ref 135–145)
Total Bilirubin: 0.7 mg/dL (ref 0.0–1.2)
Total Protein: 6.6 g/dL (ref 6.5–8.1)

## 2023-10-01 LAB — PROTIME-INR
INR: 0.9 (ref 0.8–1.2)
Prothrombin Time: 12.7 s (ref 11.4–15.2)

## 2023-10-02 ENCOUNTER — Ambulatory Visit: Payer: Self-pay | Admitting: Gastroenterology

## 2023-10-18 ENCOUNTER — Telehealth: Payer: Self-pay

## 2023-10-18 ENCOUNTER — Other Ambulatory Visit: Payer: Self-pay | Admitting: *Deleted

## 2023-10-18 DIAGNOSIS — M961 Postlaminectomy syndrome, not elsewhere classified: Secondary | ICD-10-CM | POA: Diagnosis not present

## 2023-10-18 DIAGNOSIS — R7989 Other specified abnormal findings of blood chemistry: Secondary | ICD-10-CM

## 2023-10-18 DIAGNOSIS — M542 Cervicalgia: Secondary | ICD-10-CM | POA: Diagnosis not present

## 2023-10-18 DIAGNOSIS — Z79891 Long term (current) use of opiate analgesic: Secondary | ICD-10-CM | POA: Diagnosis not present

## 2023-10-18 DIAGNOSIS — Z79899 Other long term (current) drug therapy: Secondary | ICD-10-CM | POA: Diagnosis not present

## 2023-10-18 DIAGNOSIS — G90522 Complex regional pain syndrome I of left lower limb: Secondary | ICD-10-CM | POA: Diagnosis not present

## 2023-10-18 DIAGNOSIS — Z5181 Encounter for therapeutic drug level monitoring: Secondary | ICD-10-CM | POA: Diagnosis not present

## 2023-10-18 NOTE — Telephone Encounter (Signed)
 Pt called stating that she is ready to move forward with scheduling biopsy.

## 2023-10-18 NOTE — Telephone Encounter (Signed)
   Pre-operative Risk Assessment    Patient Name: [name]  DOB: [birth year] MRN: [id number]   Date of last office visit: 09/06/23 Date of next office visit: Not scheduled   Request for Surgical Clearance    Procedure:  US  Biopsy (LIVER)   Date of Surgery:  Clearance TBD                                Surgeon:  Not provided Surgeon's Group or Practice Name:  [facility] Phone number:  [phone] Fax number:  [phone]   Type of Clearance Requested:   - Medical  - Pharmacy:  Hold Clopidogrel  (Plavix ) x5 days prior   Type of Anesthesia:  Not Indicated(moderate sedation)   Additional requests/questions:    Bonney Ival LOISE Gerome   10/18/2023, 3:59 PM

## 2023-10-20 ENCOUNTER — Other Ambulatory Visit: Payer: Self-pay

## 2023-10-20 MED ORDER — FLUTICASONE PROPIONATE 50 MCG/ACT NA SUSP: 2.0000 | Freq: Every day | NASAL | 2 refills | Status: DC | PRN

## 2023-10-20 NOTE — Telephone Encounter (Signed)
   Patient Name: [name]  DOB: Jun 17, 1945 MRN: [id number]  Primary Cardiologist: Alvan Carrier, MD  Chart reviewed as part of pre-operative protocol coverage. Given past medical history and time since last visit, based on ACC/AHA guidelines, [name] is at acceptable risk for the planned procedure without further cardiovascular testing.   Per Dr. Alvan 10/20/2023: Ok to hold plavix  for procedure   The patient was advised that if she develops new symptoms prior to surgery to contact our office to arrange for a follow-up visit, and she verbalized understanding.  I will route this recommendation to the requesting party via Epic fax function and remove from pre-op pool.  Please call with questions.  Lamarr Satterfield, NP 10/20/2023, 1:31 PM

## 2023-10-20 NOTE — Telephone Encounter (Signed)
Ok to hold plavix for procedure   J Tinnie Kunin MD 

## 2023-10-22 ENCOUNTER — Ambulatory Visit (INDEPENDENT_AMBULATORY_CARE_PROVIDER_SITE_OTHER): Admitting: Family Medicine

## 2023-10-22 VITALS — BP 121/72 | HR 70 | Wt 151.0 lb

## 2023-10-22 DIAGNOSIS — R21 Rash and other nonspecific skin eruption: Secondary | ICD-10-CM | POA: Diagnosis not present

## 2023-10-22 DIAGNOSIS — I10 Essential (primary) hypertension: Secondary | ICD-10-CM

## 2023-10-22 DIAGNOSIS — E559 Vitamin D deficiency, unspecified: Secondary | ICD-10-CM

## 2023-10-22 DIAGNOSIS — E038 Other specified hypothyroidism: Secondary | ICD-10-CM

## 2023-10-22 DIAGNOSIS — R7303 Prediabetes: Secondary | ICD-10-CM | POA: Diagnosis not present

## 2023-10-22 DIAGNOSIS — E538 Deficiency of other specified B group vitamins: Secondary | ICD-10-CM | POA: Diagnosis not present

## 2023-10-22 DIAGNOSIS — R399 Unspecified symptoms and signs involving the genitourinary system: Secondary | ICD-10-CM

## 2023-10-22 MED ORDER — TRIAMCINOLONE ACETONIDE 0.5 % EX OINT: 1.0000 | TOPICAL_OINTMENT | Freq: Two times a day (BID) | CUTANEOUS | 2 refills | Status: AC

## 2023-10-22 MED ORDER — TRIAMCINOLONE ACETONIDE 0.1 % EX CREA: 1.0000 | TOPICAL_CREAM | Freq: Two times a day (BID) | CUTANEOUS | 0 refills | Status: AC

## 2023-10-22 MED ORDER — FLUTICASONE PROPIONATE 50 MCG/ACT NA SUSP: 2.0000 | Freq: Every day | NASAL | 2 refills | Status: DC | PRN

## 2023-10-22 NOTE — Assessment & Plan Note (Signed)
 Last Hemoglobin A1c: 5.8 Labs: Ordered today, results pending; will follow up accordingly. Reviewed non-pharmacological interventions, including a balanced diet rich in lean proteins, healthy fats, whole grains, and high-fiber vegetables. Emphasized reducing refined sugars and processed carbohydrates, and incorporating more fruits, leafy greens, and legumes.

## 2023-10-22 NOTE — Patient Instructions (Addendum)
        Great to see you today.  I have refilled the medication(s) we provide.   If labs were collected, we will inform you of lab results once received either by echart message or telephone call.   - echart message- for normal results that have been seen by the patient already.   - telephone call: abnormal results or if patient has not viewed results in their echart.   - Please take medications as prescribed. - Follow up with your primary health provider if any health concerns arises. - If symptoms worsen please contact your primary care provider and/or visit the emergency department.    Dermatology # [phone]

## 2023-10-22 NOTE — Progress Notes (Signed)
 Established Patient Office Visit   Subjective  Patient ID: [name], female    DOB: 02-17-1946  Age: 78 y.o. MRN: [id number]  Chief Complaint  Patient presents with   Follow-up    6 months, Rash, left leg, has become better Would like to discuss vit. B12   Medication Refill    Dulcolax, oyster calcium , fluticasone ,     She  has a past medical history of Arthritis, CAD (coronary artery disease), CHF (congestive heart failure) (HCC), Chronic pain disorder (05/10/2017), non-Hodgkin's lymphoma, Hyperlipidemia, Hypertension, Left ventricular apical thrombus, and Low back pain (08/28/2022).  HPI Patient presents to the clinic for chronic follow up. For the details of today's visit, please refer to assessment and plan.   Review of Systems  Constitutional:  Negative for chills and fever.  Eyes:  Negative for blurred vision.  Respiratory:  Negative for shortness of breath.   Cardiovascular:  Negative for chest pain.  Genitourinary:  Negative for dysuria.  Neurological:  Negative for dizziness.      Objective:     BP 121/72 (BP Location: Left Arm, Patient Position: Sitting, Cuff Size: Large)   Pulse 70   Wt 151 lb 0.6 oz (68.5 kg)   BMI 26.76 kg/m  BP Readings from Last 3 Encounters:  10/22/23 121/72  09/06/23 (!) 145/75  06/07/23 (!) 178/78      Physical Exam Vitals reviewed.  Constitutional:      General: She is not in acute distress.    Appearance: Normal appearance. She is not ill-appearing, toxic-appearing or diaphoretic.  HENT:     Head: Normocephalic.  Eyes:     General:        Right eye: No discharge.        Left eye: No discharge.     Conjunctiva/sclera: Conjunctivae normal.  Cardiovascular:     Rate and Rhythm: Normal rate.     Pulses: Normal pulses.     Heart sounds: Normal heart sounds.  Pulmonary:     Effort: Pulmonary effort is normal. No respiratory distress.     Breath sounds: Normal breath sounds.  Abdominal:     General: Bowel sounds are  normal.     Palpations: Abdomen is soft.     Tenderness: There is no abdominal tenderness. There is no right CVA tenderness, left CVA tenderness or guarding.  Skin:    General: Skin is warm and dry.     Capillary Refill: Capillary refill takes less than 2 seconds.     Findings: Rash present.  Neurological:     Mental Status: She is alert.  Psychiatric:        Mood and Affect: Mood normal.        Behavior: Behavior normal.      No results found for any visits on 10/22/23.  The ASCVD Risk score (Arnett DK, et al., 2019) failed to calculate for the following reasons:   Risk score cannot be calculated because patient has a medical history suggesting prior/existing ASCVD    Assessment & Plan:  Prediabetes Assessment & Plan: Last Hemoglobin A1c: 5.8 Labs: Ordered today, results pending; will follow up accordingly. Reviewed non-pharmacological interventions, including a balanced diet rich in lean proteins, healthy fats, whole grains, and high-fiber vegetables. Emphasized reducing refined sugars and processed carbohydrates, and incorporating more fruits, leafy greens, and legumes.   Orders: -     Hemoglobin A1c  TSH (thyroid-stimulating hormone deficiency) -     TSH + free T4  Vitamin D  deficiency -  VITAMIN D  25 Hydroxy (Vit-D Deficiency, Fractures)  Vitamin B12 deficiency -     Vitamin B12  Primary hypertension Assessment & Plan: Vitals:   10/22/23 1636  BP: 121/72   Blood pressure controlled in today's visit, Labs ordered Continue Losartan  25 mg once daily Continued discussion on DASH diet, low sodium diet and maintain a exercise routine for 150 minutes per week.   Orders: -     CBC with Differential/Platelet  Urinary symptom or sign -     UA/M w/rflx Culture, Routine  Rash of unknown etiology -     Triamcinolone  Acetonide; Apply 1 Application topically 2 (two) times daily.  Dispense: 30 g; Refill: 2 -     Ambulatory referral to Dermatology  Other orders -      Fluticasone  Propionate; Place 2 sprays into both nostrils daily as needed for allergies.  Dispense: 9.9 mL; Refill: 2 -     Triamcinolone  Acetonide; Apply 1 Application topically 2 (two) times daily.  Dispense: 30 g; Refill: 0    Return in about 6 months (around 04/23/2024), or if symptoms worsen or fail to improve, for chronic follow-up.   Jodi Kidd Wilhelmena Falter, FNP

## 2023-10-22 NOTE — Assessment & Plan Note (Signed)
 Vitals:   10/22/23 1636  BP: 121/72   Blood pressure controlled in today's visit, Labs ordered Continue Losartan  25 mg once daily Continued discussion on DASH diet, low sodium diet and maintain a exercise routine for 150 minutes per week.

## 2023-10-26 ENCOUNTER — Ambulatory Visit: Admitting: Neurology

## 2023-10-26 ENCOUNTER — Encounter: Payer: Self-pay | Admitting: Neurology

## 2023-10-26 VITALS — BP 136/74 | Ht 63.0 in | Wt 150.0 lb

## 2023-10-26 DIAGNOSIS — R269 Unspecified abnormalities of gait and mobility: Secondary | ICD-10-CM | POA: Diagnosis not present

## 2023-10-26 DIAGNOSIS — R3915 Urgency of urination: Secondary | ICD-10-CM

## 2023-10-26 DIAGNOSIS — R413 Other amnesia: Secondary | ICD-10-CM | POA: Diagnosis not present

## 2023-10-26 DIAGNOSIS — M549 Dorsalgia, unspecified: Secondary | ICD-10-CM

## 2023-10-26 MED ORDER — OXCARBAZEPINE 150 MG PO TABS
150.0000 mg | ORAL_TABLET | Freq: Two times a day (BID) | ORAL | 6 refills | Status: AC
Start: 2023-10-26 — End: ?
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
## 2023-10-27 ENCOUNTER — Telehealth: Payer: Self-pay | Admitting: Cardiology

## 2023-10-27 ENCOUNTER — Ambulatory Visit: Payer: Self-pay | Admitting: Cardiology

## 2023-10-27 ENCOUNTER — Telehealth: Payer: Self-pay | Admitting: Neurology

## 2023-10-27 MED ORDER — NITROGLYCERIN 0.4 MG SL SUBL: 0.4000 mg | SUBLINGUAL_TABLET | SUBLINGUAL | 12 refills | Status: AC | PRN

## 2023-10-27 NOTE — Telephone Encounter (Signed)
*  STAT* If patient is at the pharmacy, call can be transferred to refill team.   1. Which medications need to be refilled? (please list name of each medication and dose if known)   nitroGLYCERIN  (NITROSTAT ) 0.4 MG SL tablet   2. Would you like to learn more about the convenience, safety, & potential cost savings by using the [facility]?   3. Are you open to using the [facility] (Type [facility].)   4. Which pharmacy/location (including street and city if local pharmacy) is medication to be sent to?  [facility] [place] [address]   5. Do they need a 30 day or 90 day supply?    Patient stated her current medication has expired.

## 2023-10-27 NOTE — Telephone Encounter (Signed)
MRI orders sent to [facility] [phone]

## 2023-10-27 NOTE — Telephone Encounter (Signed)
 Reports her home BP monitor broke about 2 weeks ago. Reports she does have home BP readings from July 2025 and apologize for not bringing them to the office already. Says she will bring them to the office for review by provider.

## 2023-10-27 NOTE — Telephone Encounter (Signed)
 Pt c/o BP issue: STAT if pt c/o blurred vision, one-sided weakness or slurred speech.  STAT if BP is GREATER than 180/120 TODAY.  STAT if BP is LESS than 90/60 and SYMPTOMATIC TODAY  1. What is your BP concern?   Patient stated her systolic pressure goes up periodically at night and her diastolic is not bad  2. Have you taken any BP medication today?  Yes  3. What are your last 5 BP readings?  Not available as her BP machine is broken  4. Are you having any other symptoms (ex. Dizziness, headache, blurred vision, passed out)?   No   Patient wants to know if she should continue to record her BP readings and noted she still had the list of her BP readings she was supposed to turn in from July.

## 2023-10-28 ENCOUNTER — Other Ambulatory Visit: Payer: Self-pay | Admitting: Cardiology

## 2023-10-29 ENCOUNTER — Encounter: Payer: Self-pay | Admitting: *Deleted

## 2023-11-16 ENCOUNTER — Telehealth: Payer: Self-pay | Admitting: Neurology

## 2023-11-16 ENCOUNTER — Other Ambulatory Visit: Payer: Self-pay | Admitting: Nurse Practitioner

## 2023-11-16 NOTE — Telephone Encounter (Signed)
 Patient requesting a Prior Authorization with [employer] for [employer] due to no longer carry diclofenac Sodium (VOLTAREN) 1 % GEL on their formulary.

## 2023-11-17 ENCOUNTER — Telehealth: Payer: Self-pay | Admitting: Family Medicine

## 2023-11-17 ENCOUNTER — Other Ambulatory Visit (HOSPITAL_BASED_OUTPATIENT_CLINIC_OR_DEPARTMENT_OTHER): Payer: Self-pay

## 2023-11-17 ENCOUNTER — Ambulatory Visit: Payer: Medicare Other

## 2023-11-17 VITALS — Ht 63.0 in | Wt 150.0 lb

## 2023-11-17 DIAGNOSIS — Z0000 Encounter for general adult medical examination without abnormal findings: Secondary | ICD-10-CM | POA: Diagnosis not present

## 2023-11-17 DIAGNOSIS — Z1231 Encounter for screening mammogram for malignant neoplasm of breast: Secondary | ICD-10-CM

## 2023-11-17 DIAGNOSIS — I25810 Atherosclerosis of coronary artery bypass graft(s) without angina pectoris: Secondary | ICD-10-CM

## 2023-11-17 DIAGNOSIS — I10 Essential (primary) hypertension: Secondary | ICD-10-CM

## 2023-11-17 MED ORDER — BLOOD PRESSURE MONITOR 3 DEVI
0 refills | Status: AC
  Filled 2023-11-17 – 2023-12-06 (×2): qty 1, 30d supply, fill #0

## 2023-11-17 NOTE — Telephone Encounter (Signed)
 Copied from CRM [id number]. Topic: General - Call Back - No Documentation >> Nov 17, 2023  4:30 PM Avram MATSU wrote: Reason for CRM: Patient called because she had a recent visit with Abby, Patient stated abby sent her some resources to look at. She stated somehow it got deleted off phone and would like a callback to discuss. Please advise [phone] ok to leave detail vm. If unable to reach patient, please leave all the information in [place].

## 2023-11-17 NOTE — Patient Instructions (Addendum)
 [name],  Thank you for taking the time for your Medicare Wellness Visit. I appreciate your continued commitment to your health goals. Please review the care plan we discussed, and feel free to reach out if I can assist you further.    Ongoing Care Seeing your primary care provider every 3 to 6 months helps us  monitor your health and provide consistent, personalized care.     Referrals  If a referral was made during today's visit and you haven't received any updates within two weeks, please contact the referred provider directly to check on the status.  Call to schedule your mammogram and/or osteoporosis screening: [facility]- [phone]    Wishing you good health and many blessings for the year to come  -[place]hattan Mccuen    Medicare recommends these wellness visits once per year to help you and your care team stay ahead of potential health issues. These visits are designed to focus on prevention, allowing your provider to concentrate on managing your acute and chronic conditions during your regular appointments.     Please note that Annual Wellness Visits do not include a physical exam. Some assessments may be limited, especially if the visit was conducted virtually. If needed, we may recommend a separate in-person follow-up with your provider.  Recommended Screenings:  Health Maintenance  Topic Date Due   Breast Cancer Screening  03/26/2010   Flu Shot  10/01/2023   COVID-19 Vaccine (5 - 2025-26 season) 11/01/2023   DEXA scan (bone density measurement)  03/11/2024   Medicare Annual Wellness Visit  11/16/2024   DTaP/Tdap/Td vaccine (2 - Td or Tdap) 01/29/2025   Pneumococcal Vaccine for age over 15  Completed   Hepatitis C Screening  Completed   Zoster (Shingles) Vaccine  Completed   HPV Vaccine  Aged Out   Meningitis B Vaccine  Aged Out       11/17/2023    1:28 PM  Advanced Directives  Does Patient Have a Medical Advance Directive? No  Would patient like information on creating  a medical advance directive? No - Patient declined    Advance Care Planning is important because it: Ensures you receive medical care that aligns with your values, goals, and preferences. Provides guidance to your family and loved ones, reducing the emotional burden of decision-making during critical moments.    Vision: Annual vision screenings are recommended for early detection of glaucoma, cataracts, and diabetic retinopathy. These exams can also reveal signs of chronic conditions such as diabetes and high blood pressure.    Dental: Annual dental screenings help detect early signs of oral cancer, gum disease, and other conditions linked to overall health, including heart disease and diabetes.  Please see the attached documents for additional preventive care recommendations.

## 2023-11-17 NOTE — Progress Notes (Signed)
 Please attest and cosign this visit due to patients primary care provider not being immediately available at the time the visit was completed.   Subjective:   [name] is a 78 y.o. who presents for a Medicare Wellness preventive visit. As a reminder, Annual Wellness Visits don't include a physical exam, and some assessments may be limited, especially if this visit is performed virtually. We may recommend an in-person follow-up visit with your provider if needed.  Visit Complete: Virtual I connected with  [name] on 11/17/23 by a video and audio enabled telemedicine application and verified that I am speaking with the correct person using two identifiers.  Patient Location: Home  Provider Location: Home Office  I discussed the limitations of evaluation and management by telemedicine. The patient expressed understanding and agreed to proceed.  Vital Signs: Because this visit was a virtual/telehealth visit, some criteria may be missing or patient reported. Any vitals not documented were not able to be obtained and vitals that have been documented are patient reported.  Persons Participating in Visit: Patient.  AWV Questionnaire: No: Patient Medicare AWV questionnaire was not completed prior to this visit. Cardiac Risk Factors include: advanced age (>71men, >10 women);dyslipidemia;hypertension;sedentary lifestyle;Other (see comment), Risk factor comments: CAD, CHF     Objective:    Today's Vitals   11/17/23 1329  Weight: 150 lb (68 kg)  Height: 5' 3 (1.6 m)   Body mass index is 26.57 kg/m.    11/17/2023    1:28 PM 03/02/2023    2:30 PM 11/11/2022    2:44 PM 07/06/2022    1:20 PM 08/25/2021    8:17 PM 08/02/2021    6:40 PM  Advanced Directives  Does Patient Have a Medical Advance Directive? No No No No No No  Would patient like information on creating a medical advance directive? No - Patient declined No - Patient declined No - Patient declined No - Patient declined No  - Patient declined No - Patient declined    Current Medications (verified) Outpatient Encounter Medications as of 11/17/2023  Medication Sig   ALPRAZolam  (XANAX ) 0.5 MG tablet Take 1 tablet (0.5 mg total) by mouth daily as needed.   atorvastatin  (LIPITOR ) 20 MG tablet Take 1 tablet (20 mg total) by mouth daily.   bisacodyl  (DULCOLAX) 10 MG suppository Place 1 suppository (10 mg total) rectally daily as needed for moderate constipation.   Blood Pressure Monitoring (BLOOD PRESSURE MONITOR 3) DEVI Check blood pressure once daily 1 hour after taking blood pressure medication   Calcium  Carb-Cholecalciferol (OYSTER SHELL CALCIUM  W/D) 500-5 MG-MCG TABS Take by mouth.   clopidogrel  (PLAVIX ) 75 MG tablet TAKE 1 TABLET BY MOUTH DAILY   cyanocobalamin  (,VITAMIN B-12,) 1000 MCG/ML injection Inject 1 mcg into the muscle every 30 (thirty) days.   dapagliflozin  propanediol (FARXIGA ) 10 MG TABS tablet TAKE ONE TABLET BY MOUTH EVERY DAY   diclofenac Sodium (VOLTAREN) 1 % GEL Apply 2 g topically 4 (four) times daily as needed (pain).   DULoxetine  (CYMBALTA ) 30 MG capsule TAKE 1 CAPSULE BY MOUTH 3 TIMES  DAILY   fluticasone  (FLONASE ) 50 MCG/ACT nasal spray Place 2 sprays into both nostrils daily as needed for allergies.   furosemide  (LASIX ) 40 MG tablet Take 1 tablet (40 mg total) by mouth as needed (shortness of breath or weight gain).   gabapentin  (NEURONTIN ) 800 MG tablet TAKE 1 TABLET BY MOUTH 3 TIMES  DAILY   losartan  (COZAAR ) 25 MG tablet Take 1 tablet (25 mg  total) by mouth daily.   naloxegol  oxalate (MOVANTIK ) 25 MG TABS tablet Take 1 tablet (25 mg total) by mouth daily.   nitroGLYCERIN  (NITROSTAT ) 0.4 MG SL tablet Place 1 tablet (0.4 mg total) under the tongue every 5 (five) minutes x 3 doses as needed for chest pain.   OXcarbazepine  (TRILEPTAL ) 150 MG tablet Take 1 tablet (150 mg total) by mouth 2 (two) times daily.   oxycodone  (ROXICODONE ) 30 MG immediate release tablet Take 30 mg by mouth 3 (three)  times daily as needed for pain.   pantoprazole  (PROTONIX ) 40 MG tablet TAKE 1 TABLET BY MOUTH DAILY   polyethylene glycol powder (GLYCOLAX /MIRALAX ) 17 GM/SCOOP powder Take 1 Container by mouth once.   potassium chloride  SA (KLOR-CON  M) 20 MEQ tablet Take 1 tablet (20 mEq total) by mouth as needed (only on the days that you take the Lasix ).   triamcinolone  cream (KENALOG ) 0.1 % Apply 1 Application topically 2 (two) times daily.   triamcinolone  ointment (KENALOG ) 0.5 % Apply 1 Application topically 2 (two) times daily.   No facility-administered encounter medications on file as of 11/17/2023.    Allergies (verified) Morphine   History: Past Medical History:  Diagnosis Date   Arthritis    CAD (coronary artery disease)    a. cath in 07/2021 showing severe multivessel CAD --> not felt to be a CABG candidate and limited viable myocardium --> received DESx2 to LAD   CHF (congestive heart failure) (HCC)    a. EF 30-35% by echo in 07/2021, normalized to 60-65% by repeat imaging later that month.   Chronic pain disorder 05/10/2017   Hx of non-Hodgkin's lymphoma    Hyperlipidemia    Hypertension    Left ventricular apical thrombus    a. noted on cMRI in 07/2021   Low back pain 08/28/2022   Past Surgical History:  Procedure Laterality Date   CORONARY STENT INTERVENTION N/A 08/08/2021   Procedure: CORONARY STENT INTERVENTION;  Surgeon: Wonda Sharper, MD;  [facility]: [facility] INVASIVE CV LAB;  Service: Cardiovascular;  Laterality: N/A;   CORONARY ULTRASOUND/IVUS N/A 08/08/2021   Procedure: Intravascular Ultrasound/IVUS;  Surgeon: Wonda Sharper, MD;  Location: [facility] INVASIVE CV LAB;  Service: Cardiovascular;  Laterality: N/A;   LEFT HEART CATH AND CORONARY ANGIOGRAPHY N/A 08/02/2021   Procedure: LEFT HEART CATH AND CORONARY ANGIOGRAPHY;  Surgeon: Mady Bruckner, MD;  Location: [facility] INVASIVE CV LAB;  Service: Cardiovascular;  Laterality: N/A;   Family History  Problem Relation Age of Onset   Aortic  aneurysm Mother    Coronary artery disease Brother    Social History   Socioeconomic History   Marital status: Widowed    Spouse name: Not on file   Number of children: Not on file   Years of education: Not on file   Highest education level: Not on file  Occupational History   Not on file  Tobacco Use   Smoking status: Former    Current packs/day: 0.00    Types: Cigarettes, E-cigarettes    Quit date: 07/03/2022    Years since quitting: 1.3    Passive exposure: Never   Smokeless tobacco: Never   Tobacco comments:    Quit 07/03/2022 07/08/22 Tay  Vaping Use   Vaping status: Never Used  Substance and Sexual Activity   Alcohol use: Not Currently   Drug use: Never   Sexual activity: Not on file  Other Topics Concern   Not on file  Social History Narrative   Right handed   Caffeine-2 cups  Lives with daughter   Social Drivers of Health   Financial Resource Strain: High Risk (11/17/2023)   Overall Financial Resource Strain (CARDIA)    Difficulty of Paying Living Expenses: Hard  Food Insecurity: Food Insecurity Present (11/17/2023)   Hunger Vital Sign    Worried About Running Out of Food in the Last Year: Sometimes true    Ran Out of Food in the Last Year: Sometimes true  Transportation Needs: No Transportation Needs (11/17/2023)   PRAPARE - Administrator, Civil Service (Medical): No    Lack of Transportation (Non-Medical): No  Physical Activity: Inactive (11/17/2023)   Exercise Vital Sign    Days of Exercise per Week: 0 days    Minutes of Exercise per Session: 0 min  Stress: Patient Declined (11/17/2023)   [employer] of Occupational Health - Occupational Stress Questionnaire    Feeling of Stress: Patient declined  Social Connections: Moderately Integrated (11/17/2023)   Social Connection and Isolation Panel    Frequency of Communication with Friends and Family: More than three times a week    Frequency of Social Gatherings with Friends and Family: More than  three times a week    Attends Religious Services: More than 4 times per year    Active Member of [employer] or Organizations: Yes    Attends Banker Meetings: More than 4 times per year    Marital Status: Widowed    Tobacco Counseling Counseling given: Yes Tobacco comments: Quit 07/03/2022 07/08/22 Tay   Clinical Intake: Pre-visit preparation completed: Yes Pain : No/denies pain   BMI - recorded: 26.57 Nutritional Status: BMI 25 -29 Overweight Nutritional Risks: None Diabetes: No Lab Results  Component Value Date   HGBA1C 5.8 (H) 11/24/2022   HGBA1C 5.9 (H) 06/15/2022   HGBA1C 5.6 03/11/2022    How often do you need to have someone help you when you read instructions, pamphlets, or other written materials from your doctor or pharmacy?: 1 - Never Interpreter Needed?: No Information entered by :: Shireen Rayburn W CMA (AAMA)  Activities of Daily Living     11/17/2023    1:56 PM  In your present state of health, do you have any difficulty performing the following activities:  Hearing? 0  Vision? 0  Difficulty concentrating or making decisions? 0  Walking or climbing stairs? 1  Comment patient is very cautious with steps due to knee pain  Dressing or bathing? 0  Doing errands, shopping? 0  Preparing Food and eating ? N  Using the Toilet? N  In the past six months, have you accidently leaked urine? N  Do you have problems with loss of bowel control? N  Managing your Medications? N  Managing your Finances? N  Housekeeping or managing your Housekeeping? N   Patient Care Team: Del Wilhelmena Falter, Hilario, FNP as PCP - General (Family Medicine) Alvan Dorn FALCON, MD as PCP - Cardiology (Cardiology)   I have updated your Care Teams any recent Medical Services you may have received from other providers in the past year.     Assessment:   This is a routine wellness examination for [place].  Hearing/Vision screen Hearing Screening - Comments:: Patient denies any hearing  difficulties.   Vision Screening - Comments:: Wears rx glasses - up to date with routine eye exams with  [facility] [place]  Goals Addressed             This Visit's Progress    Patient Stated  On track    To get my weight under control        Depression Screen     11/17/2023    2:01 PM 11/11/2022    2:59 PM 10/28/2022    4:02 PM 07/06/2022    1:15 PM 06/29/2022    2:57 PM 06/04/2022    4:00 PM  PHQ 2/9 Scores  PHQ - 2 Score 0 0 2 2 2 2   PHQ- 9 Score 0 0 8 9 7 7      Fall Risk     11/17/2023    1:55 PM 05/17/2023   11:04 AM 03/01/2023    3:45 PM 11/11/2022    3:02 PM 10/28/2022    4:03 PM  Fall Risk   Falls in the past year? 0 0 0 0 0  Number falls in past yr: 0 0 0 0   Injury with Fall? 0 0 0 0   Risk for fall due to : Impaired balance/gait;Impaired mobility No Fall Risks Other (Comment) No Fall Risks   Risk for fall due to: Comment   age    Follow up Falls evaluation completed;Education provided;Falls prevention discussed Falls evaluation completed;Education provided;Falls prevention discussed Falls evaluation completed Falls prevention discussed     MEDICARE RISK AT HOME:  Medicare Risk at Home Any stairs in or around the home?: No If so, are there any without handrails?: No Home free of loose throw rugs in walkways, pet beds, electrical cords, etc?: Yes Adequate lighting in your home to reduce risk of falls?: Yes Use of a cane, walker or w/c?: Yes (as needed) Grab bars in the bathroom?: No Shower chair or bench in shower?: No Elevated toilet seat or a handicapped toilet?: No  TIMED UP AND GO: Was the test performed?  No  Cognitive Function: 6CIT completed    03/01/2023    3:50 PM  MMSE - Mini Mental State Exam  Orientation to time 5  Orientation to Place 5  Registration 3  Attention/ Calculation 5  Recall 3  Language- name 2 objects 2  Language- repeat 1  Language- follow 3 step command 3  Language- read & follow direction 1  Write a  sentence 1  Copy design 1  Total score 30      10/26/2023    3:44 PM  Montreal Cognitive Assessment   Visuospatial/ Executive (0/5) 3  Naming (0/3) 2  Attention: Read list of digits (0/2) 2  Attention: Read list of letters (0/1) 1  Attention: Serial 7 subtraction starting at 100 (0/3) 3  Language: Repeat phrase (0/2) 2  Language : Fluency (0/1) 1  Abstraction (0/2) 2  Delayed Recall (0/5) 3  Orientation (0/6) 6  Total 25      11/17/2023    1:57 PM 11/11/2022    2:59 PM  6CIT Screen  What Year? 0 points 0 points  What month? 0 points 0 points  What time? 0 points 0 points  Count back from 20 0 points 0 points  Months in reverse 0 points 0 points  Repeat phrase 0 points 0 points  Total Score 0 points 0 points    Immunizations Immunization History  Administered Date(s) Administered   Fluad Trivalent(High Dose 65+) 03/01/2023   INFLUENZA, HIGH DOSE SEASONAL PF 01/01/2015, 12/03/2015, 12/30/2016, 12/20/2017, 12/21/2018, 01/23/2020, 01/20/2021   Influenza Inj Mdck Quad Pf 01/09/2022   Influenza,inj,quad, With Preservative 11/30/2016   Influenza-Unspecified 01/13/2011, 01/13/2011, 11/30/2016, 01/20/2021   Measles / Rubella 09/21/1989   PFIZER Comirnaty(Gray Top)Covid-19  Tri-Sucrose Vaccine 11/24/2019, 08/01/2020   PFIZER(Purple Top)SARS-COV-2 Vaccination 06/08/2019, 06/29/2019   Pneumococcal Conjugate-13 05/04/2016   Pneumococcal Polysaccharide-23 01/06/1995, 05/09/2014, 01/23/2020   Respiratory Syncytial Virus Vaccine,Recomb Aduvanted(Arexvy) 12/18/2021   Td (Adult),5 Lf Tetanus Toxid, Preservative Free 07/14/1988   Tdap 01/30/2015   Zoster Recombinant(Shingrix) 03/21/2018, 12/18/2021   Zoster, Live 05/05/2011    Screening Tests Health Maintenance  Topic Date Due   Mammogram  03/26/2010   Influenza Vaccine  10/01/2023   COVID-19 Vaccine (5 - 2025-26 season) 11/01/2023   DEXA SCAN  03/11/2024   Medicare Annual Wellness (AWV)  11/16/2024   DTaP/Tdap/Td (2 - Td or  Tdap) 01/29/2025   Pneumococcal Vaccine: 50+ Years  Completed   Hepatitis C Screening  Completed   Zoster Vaccines- Shingrix  Completed   HPV VACCINES  Aged Out   Meningococcal B Vaccine  Aged Out    Health Maintenance Health Maintenance Due  Topic Date Due   Mammogram  03/26/2010   Influenza Vaccine  10/01/2023   COVID-19 Vaccine (5 - 2025-26 season) 11/01/2023   Health Maintenance Items Addressed: Mammogram ordered  Additional Screening: Vision Screening: Recommended annual ophthalmology exams for early detection of glaucoma and other disorders of the eye. Would you like a referral to an eye doctor? No    Dental Screening: Recommended annual dental exams for proper oral hygiene  Community Resource Referral / Chronic Care Management: CRR required this visit?  No   CCM required this visit?  No  Plan:   I have personally reviewed and noted the following in the patient's chart:   Medical and social history Use of alcohol, tobacco or illicit drugs  Current medications and supplements including opioid prescriptions. Patient is currently taking opioid prescriptions. Information provided to patient regarding non-opioid alternatives. Patient advised to discuss non-opioid treatment plan with their provider. Functional ability and status Nutritional status Physical activity Advanced directives List of other physicians Hospitalizations, surgeries, and ER visits in previous 12 months Vitals Screenings to include cognitive, depression, and falls Referrals and appointments  In addition, I have reviewed and discussed with patient certain preventive protocols, quality metrics, and best practice recommendations. A written personalized care plan for preventive services as well as general preventive health recommendations were provided to patient.   Eliel Dudding, CMA   11/17/2023   After Visit Summary: (Mail) Due to this being a telephonic visit, the after visit summary with patients  personalized plan was offered to patient via mail   Notes: Nothing significant to report at this time.

## 2023-11-22 ENCOUNTER — Ambulatory Visit: Admitting: Gastroenterology

## 2023-11-22 ENCOUNTER — Encounter: Payer: Self-pay | Admitting: Gastroenterology

## 2023-11-22 ENCOUNTER — Telehealth: Payer: Self-pay | Admitting: Neurology

## 2023-11-22 ENCOUNTER — Other Ambulatory Visit: Payer: Self-pay | Admitting: Family Medicine

## 2023-11-22 VITALS — BP 138/77 | HR 86 | Temp 98.9°F | Ht 63.0 in | Wt 149.4 lb

## 2023-11-22 DIAGNOSIS — Z79899 Other long term (current) drug therapy: Secondary | ICD-10-CM

## 2023-11-22 DIAGNOSIS — R7989 Other specified abnormal findings of blood chemistry: Secondary | ICD-10-CM | POA: Diagnosis not present

## 2023-11-22 DIAGNOSIS — Z79891 Long term (current) use of opiate analgesic: Secondary | ICD-10-CM | POA: Diagnosis not present

## 2023-11-22 DIAGNOSIS — K5903 Drug induced constipation: Secondary | ICD-10-CM

## 2023-11-22 DIAGNOSIS — R413 Other amnesia: Secondary | ICD-10-CM

## 2023-11-22 DIAGNOSIS — K219 Gastro-esophageal reflux disease without esophagitis: Secondary | ICD-10-CM

## 2023-11-22 MED ORDER — NALOXEGOL OXALATE 25 MG PO TABS: 25.0000 mg | ORAL_TABLET | Freq: Every day | ORAL | 3 refills | Status: AC

## 2023-11-22 NOTE — Telephone Encounter (Signed)
 Call to patient, no answer. Left message to call back and review medications.  Dr Onita Come call patient about her medications, on polypharmacy for pain. Trileptal  150mg  am/ at night before bed. Gabapentin  800mg  tid--by pcp Cymbalta  30mg  tid--by her PCP

## 2023-11-22 NOTE — Patient Instructions (Signed)
 VISIT SUMMARY:  During your visit, we discussed your ongoing issues with constipation, memory impairment, elevated liver enzymes, and challenges with managing multiple medications. We reviewed your current treatments and made some adjustments to help improve your symptoms and overall health.  YOUR PLAN:  -ELEVATED LIVER ENZYMES: Your liver enzymes are higher than normal, which can be due to various factors like high blood pressure, high cholesterol, obesity, diabetes, or medication effects. We will monitor your liver enzymes every three months and encourage regular exercise and a low-fat diet. We will also keep an eye on your blood pressure, cholesterol, and blood sugar levels. If your liver enzyme levels increase significantly, we may discuss the possibility of a liver biopsy again in the future.  -CHRONIC OPIOID INDUCED CONSTIPATION: Chronic constipation means having infrequent or difficult bowel movements,likey due to chronic pain medication use. We will refill your Movantik  prescription and encourage you to use it daily. If you still have less frequent bowel movements, you can add Miralax  17g (1 capful) daily. Continue using fiber supplements to help with regularity.  -MEMORY IMPAIRMENT: Memory impairment involves difficulty recalling information. We will send a message to your neurologist to check if there are any interactions between your medications, Trileptal  and gabapentin , that might be contributing to your memory issues. Keep appointment for your MRI.  -POLYPHARMACY WITH MEDICATION MANAGEMENT CHALLENGES: Polypharmacy means taking multiple medications, which can be challenging to manage. We will contact your primary care provider to arrange an appointment with a pharmacy provider who can review your medications and help optimize your regimen.  INSTRUCTIONS:  Please follow up with your primary care provider to arrange an appointment with a pharmacy provider for a comprehensive medication  review. Continue monitoring your liver enzymes every three months and maintain regular exercise and a low-fat diet. Use Movantik  daily for constipation and add Miralax  if needed. We will also reach out to your neurologist regarding potential medication interactions.  Follow up with me in 3 months.   It was a pleasure to see you today. I want to create trusting relationships with patients. If you receive a survey regarding your visit,  I greatly appreciate you taking time to fill this out on paper or through your MyChart. I value your feedback.  Charmaine Melia, MSN, FNP-BC, AGACNP-BC [facility]

## 2023-11-22 NOTE — Telephone Encounter (Signed)
 She called me and asked if she can go to [facility] for the MRIs. She wanted to call them to schedule because she gets so many random calls during the day. I switched the location on the orders and gave her the scheduling phone number. [phone]

## 2023-11-22 NOTE — Progress Notes (Signed)
 GI Office Note    Referring Provider: [name], Ilian* Primary Care Physician:  Terry Wilhelmena Lloyd Hilario, FNP Primary Gastroenterologist: Carlin POUR. Cindie, DO  Date:  11/22/2023  ID:  [name], [name] March 06, 1945, MRN [id number]  Chief Complaint   Chief Complaint  Patient presents with   Follow-up    Follow up. No problems    History of Present Illness  [name] is a 78 y.o. female with a history of CAD s/p stenting x 2 to LAD in June 2023 on Plavix , CHF, non-Hodgkin's lymphoma, arthritis, HTN, elevated LFTs, and opioid-induced constipation presenting today with complaint of ongoing constipation.  Last colonoscopy in December 2020: -3 polyps ranging 3-6 mm in the cecum, ascending, and descending colon.  -pathology revealed 1 sessile serrated adenoma and 2 tubular adenomas.    Labs 03/11/22: CBC normal. A1c 5.6. Normal Lipid panel. Hep C Ab and HIV negative. Vit D normal. TSH normal. Cr 1.1, GFR 52, Na 146, alk phos 146, AST 62, ALT 61    Last office visit 05/04/2022.  Taking smooth lax 1 capful 2-3 times per week and fiber 2-3 capsules daily.  Given when she has her other medications she has trouble remembering to take the smooth lax.  Has intermittent abdominal pain she suspects is related to her medications and improves after bowel movements.  She does use suppositories about once every 2 weeks if needed.  Reflux controlled with pantoprazole  40 mg once daily.  Stated liver enzymes were normal until atorvastatin  dose doubled.  Denied any herbal supplements, tattoos, blood transfusions, or NSAIDs.  No regular alcohol use.  Advised to follow fatty liver diet.  Serologies checked including autoimmune serologies and viral hepatitis labs and complete RUQ US .  Advise continue to hold atorvastatin  for now and then consider resuming low dose in the future.  Advised to continue MiraLAX  1 capful daily and pantoprazole  40 mg daily.   Labs 05/18/2022: GFR 57, creatinine 1.01, glucose  103.  Sodium 137.  AST, ALT, and alk phos within normal limits.  Negative for viral hepatitis.  ANA, AMA, IgG, IgA, and IgM negative. ASMA positive.    RUQ US  05/18/2022: -No cholelithiasis or acute cholecystitis -CBD 4.4 mm -No focal liver lesion identified   Last office visit 12/03/22.  Admittedly forgetting to take her MiraLAX  as she does not like to take it because of the volume.  Continues to struggle with constipation.  Going at least a week without a bowel movement on average.  Usually remembers to take fiber pills.  Sometimes stools are formed sometimes not, consistency based on dietary choice.  Continues to take chronic pain medication, usually 3-4/day as well as her gabapentin .  Also admits to having trouble with memory issues recently.  Had been restarted on atorvastatin  just recently due to elevated LDL.  Given trial of Movantik  12.5 mg daily.  Advised to continue fiber pills.  Advise repeat LFTs in 6 weeks.  Consider reducing atorvastatin  dose.  Continue pantoprazole  40 mg once daily.   RUQ US  with elastography 03/17/2023: - Mild gallbladder distention without evidence of gallstones - K PA 3.7 - No liver mass present.  Labs in March with elevated ELF score and mild fibrosis on FibroSure.  ASMA also elevated but can be present with fatty liver.  Mediterranean diet recommended.  Given conflicting results on NITs, per AASLD guidelines, in order to determine fibrosis and rule out cirrhosis, liver biopsy is recommended.   3/17 patient reported that Movantik  was  working well for her but requested a stronger dose if possible  Last office visit 06/07/23.  Having more elevated blood pressures recently.  Trying to watch her sodium intake.  Reflux well-controlled without any nausea, vomiting, or dysphagia.  Seems as though Movantik  was working well for her after she had been on medication for about 2 weeks.  Sometimes going every other day or daily.  Can tell a difference when she takes the medication  versus when she does not.      Latest Ref Rng & Units 10/01/2023    3:39 PM 09/20/2023   11:15 AM 02/15/2023    3:38 PM 02/15/2023    3:37 PM 01/06/2023    3:05 PM 11/24/2022    1:27 PM  CMP  Glucose 70 - 99 mg/dL 891  898   91  866  86   BUN 8 - 23 mg/dL 22  15   15  17  11    Creatinine 0.44 - 1.00 mg/dL 8.92  9.11   9.02  9.06  0.82   Sodium 135 - 145 mmol/L 143  138   139  136  144   Potassium 3.5 - 5.1 mmol/L 3.9  4.0   4.4  3.4  3.8   Chloride 98 - 111 mmol/L 105  105   104  101  103   CO2 22 - 32 mmol/L 26  27   28  25  28    Calcium  8.9 - 10.3 mg/dL 8.5  9.0   9.1  8.6  9.3   Total Protein 6.5 - 8.1 g/dL 6.6  7.1  6.8  7.0  6.5  6.4   Total Bilirubin 0.0 - 1.2 mg/dL 0.7  0.5  0.6  0.7  0.6  0.4   Alkaline Phos 38 - 126 U/L 112  90  112  112  113  200   AST 15 - 41 U/L 134  98  180  178  80  116   ALT 0 - 44 U/L 114  91  207  210  82  148      Since last office visit she has had labs performed as noted above.  I recommend she undergo a liver biopsy to determine cause of elevation and to determine her potential candidacy for Rezdiffra.   I was contacted within the last couple weeks from [place] who has been trying to reach out to the patient to get her scheduled for liver biopsy and they have been unsuccessful.  Today:  Discussed the use of AI scribe software for clinical note transcription with the patient, who gave verbal consent to proceed.  She experiences constipation managed with Movantik  25 mg, providing some relief, but she has bowel movements only about once a week. She previously used vegetable laxatives and fiber supplements, taking two to three fibers and one laxative every three days, and continues to use these less frequently alongside Movantik . She does not take Movantik  daily due to confusion with her medication schedule, which includes heart medication.   She is concerned about potential interactions between her medications, particularly with the addition of  Trileptal  for nerve pain in her left leg. She is already on gabapentin  and is unsure if these medications could be contributing to her memory issues, which have been worsening over the past year and a half. The leg pain is described as burning, throbbing, and swelling, exacerbated by standing for long periods.  She is hesitant about undergoing a liver biopsy  due to concerns about post-procedure care and her daughter's availability to assist her.  She reports progressive memory issues over the past year and a half, with difficulty recalling information despite knowing what she wants to say. This memory issue predates her neurology consultation and the initiation of Trileptal .  She manages multiple medications and expresses difficulty in keeping track of them, especially with the addition of new prescriptions. She uses a pill organizer to help manage her medications but still finds it challenging due to the number of medications prescribed.      Wt Readings from Last 5 Encounters:  11/22/23 149 lb 6.4 oz (67.8 kg)  11/17/23 150 lb (68 kg)  10/26/23 150 lb (68 kg)  10/22/23 151 lb 0.6 oz (68.5 kg)  09/06/23 146 lb 3.2 oz (66.3 kg)    Current Outpatient Medications  Medication Sig Dispense Refill   ALPRAZolam  (XANAX ) 0.5 MG tablet Take 1 tablet (0.5 mg total) by mouth daily as needed. 30 tablet 0   atorvastatin  (LIPITOR ) 20 MG tablet Take 1 tablet (20 mg total) by mouth daily. 90 tablet 1   bisacodyl  (DULCOLAX) 10 MG suppository Place 1 suppository (10 mg total) rectally daily as needed for moderate constipation. 12 suppository 0   Blood Pressure Monitoring (BLOOD PRESSURE MONITOR 3) DEVI Check blood pressure once daily 1 hour after taking blood pressure medication 1 each 0   Calcium  Carb-Cholecalciferol (OYSTER SHELL CALCIUM  W/D) 500-5 MG-MCG TABS Take by mouth.     clopidogrel  (PLAVIX ) 75 MG tablet TAKE 1 TABLET BY MOUTH DAILY 100 tablet 3   cyanocobalamin  (,VITAMIN B-12,) 1000 MCG/ML  injection Inject 1 mcg into the muscle every 30 (thirty) days.     dapagliflozin  propanediol (FARXIGA ) 10 MG TABS tablet TAKE ONE TABLET BY MOUTH EVERY DAY 30 tablet 9   diclofenac Sodium (VOLTAREN) 1 % GEL Apply 2 g topically 4 (four) times daily as needed (pain).     DULoxetine  (CYMBALTA ) 30 MG capsule TAKE 1 CAPSULE BY MOUTH 3 TIMES  DAILY 90 capsule 11   fluticasone  (FLONASE ) 50 MCG/ACT nasal spray Place 2 sprays into both nostrils daily as needed for allergies. 9.9 mL 2   furosemide  (LASIX ) 40 MG tablet Take 1 tablet (40 mg total) by mouth as needed (shortness of breath or weight gain). 30 tablet 3   gabapentin  (NEURONTIN ) 800 MG tablet TAKE 1 TABLET BY MOUTH 3 TIMES  DAILY 90 tablet 11   losartan  (COZAAR ) 25 MG tablet Take 1 tablet (25 mg total) by mouth daily. 90 tablet 1   naloxegol  oxalate (MOVANTIK ) 25 MG TABS tablet Take 1 tablet (25 mg total) by mouth daily. 30 tablet 2   naloxegol  oxalate (MOVANTIK ) 25 MG TABS tablet Take by mouth daily.     nitroGLYCERIN  (NITROSTAT ) 0.4 MG SL tablet Place 1 tablet (0.4 mg total) under the tongue every 5 (five) minutes x 3 doses as needed for chest pain. 25 tablet 12   OXcarbazepine  (TRILEPTAL ) 150 MG tablet Take 1 tablet (150 mg total) by mouth 2 (two) times daily. 60 tablet 6   oxycodone  (ROXICODONE ) 30 MG immediate release tablet Take 30 mg by mouth 3 (three) times daily as needed for pain.     pantoprazole  (PROTONIX ) 40 MG tablet TAKE 1 TABLET BY MOUTH DAILY 90 tablet 3   polyethylene glycol powder (GLYCOLAX /MIRALAX ) 17 GM/SCOOP powder Take 1 Container by mouth once.     potassium chloride  SA (KLOR-CON  M) 20 MEQ tablet Take 1 tablet (20 mEq total) by mouth  as needed (only on the days that you take the Lasix ). 30 tablet 3   triamcinolone  cream (KENALOG ) 0.1 % Apply 1 Application topically 2 (two) times daily. 30 g 0   triamcinolone  ointment (KENALOG ) 0.5 % Apply 1 Application topically 2 (two) times daily. 30 g 2   No current facility-administered  medications for this visit.    Past Medical History:  Diagnosis Date   Arthritis    CAD (coronary artery disease)    a. cath in 07/2021 showing severe multivessel CAD --> not felt to be a CABG candidate and limited viable myocardium --> received DESx2 to LAD   CHF (congestive heart failure) (HCC)    a. EF 30-35% by echo in 07/2021, normalized to 60-65% by repeat imaging later that month.   Chronic pain disorder 05/10/2017   Hx of non-Hodgkin's lymphoma    Hyperlipidemia    Hypertension    Left ventricular apical thrombus    a. noted on cMRI in 07/2021   Low back pain 08/28/2022    Past Surgical History:  Procedure Laterality Date   CORONARY STENT INTERVENTION N/A 08/08/2021   Procedure: CORONARY STENT INTERVENTION;  Surgeon: Wonda Sharper, MD;  [facility] INVASIVE CV LAB;  Service: Cardiovascular;  Laterality: N/A;   CORONARY ULTRASOUND/IVUS N/A 08/08/2021   Procedure: Intravascular Ultrasound/IVUS;  Surgeon: Wonda Sharper, MD;  Location: [facility] INVASIVE CV LAB;  Service: Cardiovascular;  Laterality: N/A;   LEFT HEART CATH AND CORONARY ANGIOGRAPHY N/A 08/02/2021   Procedure: LEFT HEART CATH AND CORONARY ANGIOGRAPHY;  Surgeon: Mady Bruckner, MD;  Location: [facility] INVASIVE CV LAB;  Service: Cardiovascular;  Laterality: N/A;    Family History  Problem Relation Age of Onset   Aortic aneurysm Mother    Coronary artery disease Brother     Allergies as of 11/22/2023 - Review Complete 11/22/2023  Allergen Reaction Noted   Morphine Other (See Comments) 08/22/2014    Social History   Socioeconomic History   Marital status: Widowed    Spouse name: Not on file   Number of children: Not on file   Years of education: Not on file   Highest education level: Not on file  Occupational History   Not on file  Tobacco Use   Smoking status: Former    Current packs/day: 0.00    Types: Cigarettes, E-cigarettes    Quit date: 07/03/2022    Years since quitting: 1.3    Passive exposure: Never    Smokeless tobacco: Never   Tobacco comments:    Quit 07/03/2022 07/08/22 [place]  Vaping Use   Vaping status: Never Used  Substance and Sexual Activity   Alcohol use: Not Currently   Drug use: Never   Sexual activity: Not on file  Other Topics Concern   Not on file  Social History Narrative   Right handed   Caffeine-2 cups   Lives with daughter   Social Drivers of Health   Financial Resource Strain: High Risk (11/17/2023)   Overall Financial Resource Strain (CARDIA)    Difficulty of Paying Living Expenses: Hard  Food Insecurity: Food Insecurity Present (11/17/2023)   Hunger Vital Sign    Worried About Running Out of Food in the Last Year: Sometimes true    Ran Out of Food in the Last Year: Sometimes true  Transportation Needs: No Transportation Needs (11/17/2023)   PRAPARE - Administrator, Civil Service (Medical): No    Lack of Transportation (Non-Medical): No  Physical Activity: Inactive (11/17/2023)   Exercise Vital  Sign    Days of Exercise per Week: 0 days    Minutes of Exercise per Session: 0 min  Stress: Patient Declined (11/17/2023)   [employer] of Occupational Health - Occupational Stress Questionnaire    Feeling of Stress: Patient declined  Social Connections: Moderately Integrated (11/17/2023)   Social Connection and Isolation Panel    Frequency of Communication with Friends and Family: More than three times a week    Frequency of Social Gatherings with Friends and Family: More than three times a week    Attends Religious Services: More than 4 times per year    Active Member of [employer] or Organizations: Yes    Attends Banker Meetings: More than 4 times per year    Marital Status: Widowed     Review of Systems   Gen: Denies fever, chills, anorexia. Denies fatigue, weakness, weight loss.  CV: Denies chest pain, palpitations, syncope, peripheral edema, and claudication. Resp: Denies dyspnea at rest, cough, wheezing, coughing up blood, and  pleurisy. GI: See HPI Derm: Denies rash, itching, dry skin Psych: + confusion, + anxiety, _+ memory loss. Denies depression. No homicidal or suicidal ideation.  Heme: Denies bruising, bleeding, and enlarged lymph nodes.  Physical Exam   BP 138/77 (BP Location: Right Arm, Patient Position: Sitting, Cuff Size: Normal)   Pulse 86   Temp 98.9 F (37.2 C) (Temporal)   Ht 5' 3 (1.6 m)   Wt 149 lb 6.4 oz (67.8 kg)   BMI 26.47 kg/m   General:   Alert and oriented. No distress noted. Pleasant and cooperative.  Head:  Normocephalic and atraumatic. Eyes:  Conjuctiva clear without scleral icterus. Mouth:  Oral mucosa pink and moist. Missing upper dentition - broken bridge. No lesions. Abdomen:  +BS, soft, non-tender and non-distended. No rebound or guarding. No HSM or masses noted. Rectal: deferred Msk:  Symmetrical without gross deformities. Normal posture. Extremities:  Without edema. Neurologic:  Alert and  oriented x4 Psych:  Alert and cooperative. Normal mood and affect.  Assessment & Plan  [name] is a 78 y.o. female presenting today with concerns regarding constipation, medication management, and discuss liver biopsy.      Elevated liver enzymes of unclear etiology Liver enzymes exhibit a fluctuating pattern. Differential diagnosis includes metabolic causes such as hypertension, hyperlipidemia, obesity, diabetes, or medication effects.  Has had positive ASMA in the past but all other autoimmune labs negative including immunoglobulins.  Viral hepatitis has been ruled out. She is hesitant to proceed with a liver biopsy due to concerns about post-procedure care and invasiveness. Risks of the biopsy include bleeding, infection, and potential damage to other organs. Benefits include obtaining a definitive diagnosis to guide treatment, potentially with newer medications that can reverse fibrosis. Without the biopsy, the exact cause remains uncertain, and treatment options are limited  to managing metabolic factors and monitoring enzyme levels. - Monitor liver enzymes every three months. - Encourage regular exercise and a low-fat diet to manage metabolic factors. - Continue to monitor blood pressure, cholesterol, and blood sugar levels. - Discuss potential biopsy in the future if liver enzyme levels spike significantly.  Chronic Opioid-induced constipation Chronic constipation persists despite the use of Movantik  25 mg, which has provided some relief. Bowel movements occur approximately once a week, but not taking medication daily. She is also using fiber and occasional laxatives but not daily. There is confusion about medication timing and potential interactions, which may affect adherence to the constipation regimen. - Refill Movantik   25 mg prescription and encourage daily use. - Recommend adding Miralax  if bowel movements are less frequent than desired. - Continue fiber supplementation daily for regularity.  Memory impairment Memory impairment has been ongoing for 1.5 to 2 years, with recent worsening. She experiences difficulty with recall rather than forgetting. There is a concern about potential interactions between Trileptal  and gabapentin , which may contribute to confusion. Following with neurology and has MRI coming up.  - Send a message to the neurologist regarding potential interactions between Trileptal  and gabapentin . - Continue to follow with neurology and keep MRI appointment.   Polypharmacy with medication management challenges She experiences confusion with medication management due to the number of medications and their timing. There is a need for a comprehensive review of her medications to identify potential interactions and optimize her regimen. - Contact primary care to arrange an appointment with a pharmacy provider for medication review.      Follow up    Follow up 3 months.   I have sent staff messages to PCP as well as neurology discussing the  above issues.  Charmaine Melia, MSN, FNP-BC, AGACNP-BC [facility]

## 2023-11-22 NOTE — Telephone Encounter (Signed)
 Jodi Charmaine CROME, NP  Onita Duos, MD Hi Dr. Onita, This is a mutual patient of ours.  I seen [name] in the office today to discuss [name] constipation as well as [name] elevated liver enzymes.  Unsure at this time whether or not this is medication related or metabolic in nature.  Currently is not wanting to undergo liver biopsy.  My main concern for [name] is [name] memory loss and some of [name] confusion and she is also having trouble remembering why she is on the medications that she is on.  She is curious about how the Trileptal  is potentially interacting with some of [name] other medications.  I know there is potential interactions between this and gabapentin  and wanted to check with you to see if this could be worsening some of [name] memory impairment or if you think there is some underlying dementia or other cause of [name] memory loss.  She would like someone from their office to call [name] and let [name] know about timing of taking [name] Trileptal .  Charmaine Kennedy, MSN, APRN, FNP-BC, AGACNP-BC [facility]  Please call patient about [name] medications, on polypharmacy for pain. Trileptal  150mg  am/ at night before bed. Gabapentin  800mg  tid--by pcp Cymbalta  30mg  tid--by [name] PCP

## 2023-11-23 ENCOUNTER — Other Ambulatory Visit (HOSPITAL_BASED_OUTPATIENT_CLINIC_OR_DEPARTMENT_OTHER): Payer: Self-pay

## 2023-11-23 ENCOUNTER — Encounter: Payer: Self-pay | Admitting: Cardiology

## 2023-11-23 NOTE — Telephone Encounter (Signed)
 Lvm 2nd attempt by hf 11/23/23

## 2023-11-24 NOTE — Telephone Encounter (Signed)
 Lvm 3rd and final attempt. Closing out for no contact

## 2023-11-25 ENCOUNTER — Ambulatory Visit: Admitting: Physical Therapy

## 2023-11-26 ENCOUNTER — Other Ambulatory Visit (HOSPITAL_COMMUNITY): Payer: Self-pay

## 2023-11-29 ENCOUNTER — Other Ambulatory Visit (HOSPITAL_BASED_OUTPATIENT_CLINIC_OR_DEPARTMENT_OTHER): Payer: Self-pay

## 2023-11-29 ENCOUNTER — Other Ambulatory Visit: Payer: Self-pay | Admitting: Cardiology

## 2023-11-30 ENCOUNTER — Ambulatory Visit (HOSPITAL_COMMUNITY)
Admission: RE | Admit: 2023-11-30 | Discharge: 2023-11-30 | Disposition: A | Source: Ambulatory Visit | Attending: Neurology | Admitting: Neurology

## 2023-11-30 ENCOUNTER — Ambulatory Visit (HOSPITAL_COMMUNITY)
Admission: RE | Admit: 2023-11-30 | Discharge: 2023-11-30 | Disposition: A | Discharge status: Home / Self Care | Source: Ambulatory Visit | Attending: Neurology | Admitting: Neurology

## 2023-11-30 DIAGNOSIS — R413 Other amnesia: Secondary | ICD-10-CM | POA: Insufficient documentation

## 2023-11-30 DIAGNOSIS — R269 Unspecified abnormalities of gait and mobility: Secondary | ICD-10-CM | POA: Insufficient documentation

## 2023-11-30 DIAGNOSIS — R3915 Urgency of urination: Secondary | ICD-10-CM

## 2023-11-30 DIAGNOSIS — M549 Dorsalgia, unspecified: Secondary | ICD-10-CM

## 2023-11-30 DIAGNOSIS — R2689 Other abnormalities of gait and mobility: Secondary | ICD-10-CM | POA: Diagnosis not present

## 2023-11-30 DIAGNOSIS — M48061 Spinal stenosis, lumbar region without neurogenic claudication: Secondary | ICD-10-CM | POA: Diagnosis not present

## 2023-11-30 DIAGNOSIS — M51369 Other intervertebral disc degeneration, lumbar region without mention of lumbar back pain or lower extremity pain: Secondary | ICD-10-CM

## 2023-11-30 DIAGNOSIS — S299XXA Unspecified injury of thorax, initial encounter: Secondary | ICD-10-CM | POA: Diagnosis not present

## 2023-11-30 DIAGNOSIS — M47816 Spondylosis without myelopathy or radiculopathy, lumbar region: Secondary | ICD-10-CM | POA: Diagnosis not present

## 2023-11-30 DIAGNOSIS — M4312 Spondylolisthesis, cervical region: Secondary | ICD-10-CM | POA: Diagnosis not present

## 2023-11-30 DIAGNOSIS — M4802 Spinal stenosis, cervical region: Secondary | ICD-10-CM | POA: Diagnosis not present

## 2023-12-01 ENCOUNTER — Telehealth: Payer: Self-pay | Admitting: Neurology

## 2023-12-01 ENCOUNTER — Other Ambulatory Visit (HOSPITAL_BASED_OUTPATIENT_CLINIC_OR_DEPARTMENT_OTHER): Payer: Self-pay

## 2023-12-01 ENCOUNTER — Ambulatory Visit: Payer: Self-pay | Admitting: Neurology

## 2023-12-01 DIAGNOSIS — M48061 Spinal stenosis, lumbar region without neurogenic claudication: Secondary | ICD-10-CM

## 2023-12-01 NOTE — Telephone Encounter (Signed)
 Please call patient, MRI of the brain showed no significant abnormality  MRI of the lumbar spine showed previous posterior fusion at L3-4, 4 5, there is severe stenosis at the level above that, L2-3, this is worsened compared to previous study from October 2022.  Above abnormal MRI lumbar findings might explain her worsening low back pain radiating into left lower extremity  I have referred her to [place] neurosurgical pain management,  Orders Placed This Encounter  Procedures   Ambulatory referral to Neurosurgery       MRI brain  No significant abnormality   Lumbar spondylosis.   Previous posterior fusion at L3-4 and anterior posterior fusion at L4-5.   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 This is worse compared with the prior study from December 12, 2020.

## 2023-12-02 ENCOUNTER — Telehealth: Payer: Self-pay | Admitting: Neurology

## 2023-12-02 ENCOUNTER — Other Ambulatory Visit (HOSPITAL_BASED_OUTPATIENT_CLINIC_OR_DEPARTMENT_OTHER): Payer: Self-pay

## 2023-12-02 NOTE — Telephone Encounter (Signed)
 Lvm 1st attempt by hf  12/02/23

## 2023-12-02 NOTE — Telephone Encounter (Signed)
 Referral for neurosurgery fax to [facility]. Phone: [phone], Fax: [phone]

## 2023-12-02 NOTE — Telephone Encounter (Signed)
 Call to patient and review results. She is already seeing pain management and and is not interested in neurosurgeon consult.

## 2023-12-06 ENCOUNTER — Other Ambulatory Visit (HOSPITAL_BASED_OUTPATIENT_CLINIC_OR_DEPARTMENT_OTHER): Payer: Self-pay

## 2023-12-08 NOTE — Telephone Encounter (Signed)
 [facility] LVM for referral Coordinator  about  Pt decision

## 2023-12-09 ENCOUNTER — Other Ambulatory Visit (HOSPITAL_BASED_OUTPATIENT_CLINIC_OR_DEPARTMENT_OTHER): Payer: Self-pay

## 2023-12-09 DIAGNOSIS — E038 Other specified hypothyroidism: Secondary | ICD-10-CM | POA: Diagnosis not present

## 2023-12-09 DIAGNOSIS — E559 Vitamin D deficiency, unspecified: Secondary | ICD-10-CM | POA: Diagnosis not present

## 2023-12-09 DIAGNOSIS — I10 Essential (primary) hypertension: Secondary | ICD-10-CM | POA: Diagnosis not present

## 2023-12-09 DIAGNOSIS — R7303 Prediabetes: Secondary | ICD-10-CM | POA: Diagnosis not present

## 2023-12-09 NOTE — Telephone Encounter (Signed)
 noted

## 2023-12-10 LAB — CBC WITH DIFFERENTIAL/PLATELET
Basophils Absolute: 0.1 x10E3/uL (ref 0.0–0.2)
Basos: 1 %
EOS (ABSOLUTE): 0.4 x10E3/uL (ref 0.0–0.4)
Eos: 7 %
Hematocrit: 43 % (ref 34.0–46.6)
Hemoglobin: 14 g/dL (ref 11.1–15.9)
Immature Grans (Abs): 0 x10E3/uL (ref 0.0–0.1)
Immature Granulocytes: 0 %
Lymphocytes Absolute: 1.8 x10E3/uL (ref 0.7–3.1)
Lymphs: 31 %
MCH: 29.9 pg (ref 26.6–33.0)
MCHC: 32.6 g/dL (ref 31.5–35.7)
MCV: 92 fL (ref 79–97)
Monocytes Absolute: 0.8 x10E3/uL (ref 0.1–0.9)
Monocytes: 13 %
Neutrophils Absolute: 2.7 x10E3/uL (ref 1.4–7.0)
Neutrophils: 48 %
Platelets: 173 x10E3/uL (ref 150–450)
RBC: 4.68 x10E6/uL (ref 3.77–5.28)
RDW: 12.9 % (ref 11.7–15.4)
WBC: 5.8 x10E3/uL (ref 3.4–10.8)

## 2023-12-10 LAB — HEMOGLOBIN A1C
Est. average glucose Bld gHb Est-mCnc: 114 mg/dL
Hgb A1c MFr Bld: 5.6 % (ref 4.8–5.6)

## 2023-12-10 LAB — TSH+FREE T4
Free T4: 0.95 ng/dL (ref 0.82–1.77)
TSH: 3.92 u[IU]/mL (ref 0.450–4.500)

## 2023-12-10 LAB — VITAMIN D 25 HYDROXY (VIT D DEFICIENCY, FRACTURES): Vit D, 25-Hydroxy: 54.4 ng/mL (ref 30.0–100.0)

## 2023-12-12 LAB — URINE CULTURE, REFLEX

## 2023-12-12 LAB — UA/M W/RFLX CULTURE, ROUTINE
Bilirubin, UA: NEGATIVE
Ketones, UA: NEGATIVE
Nitrite, UA: NEGATIVE
Protein,UA: NEGATIVE
Specific Gravity, UA: 1.02 (ref 1.005–1.030)
Urobilinogen, Ur: 1 mg/dL (ref 0.2–1.0)
pH, UA: 6 (ref 5.0–7.5)

## 2023-12-12 LAB — MICROSCOPIC EXAMINATION
Casts: NONE SEEN /LPF
Epithelial Cells (non renal): >10 /HPF — AB (ref 0–10)
WBC, UA: >30 /HPF — AB (ref 0–5)

## 2024-01-18 ENCOUNTER — Other Ambulatory Visit: Payer: Self-pay | Admitting: *Deleted

## 2024-01-18 DIAGNOSIS — R7989 Other specified abnormal findings of blood chemistry: Secondary | ICD-10-CM

## 2024-01-25 ENCOUNTER — Encounter: Payer: Self-pay | Admitting: Gastroenterology

## 2024-01-25 ENCOUNTER — Other Ambulatory Visit (HOSPITAL_BASED_OUTPATIENT_CLINIC_OR_DEPARTMENT_OTHER): Payer: Self-pay

## 2024-01-26 ENCOUNTER — Other Ambulatory Visit (HOSPITAL_COMMUNITY)
Admission: RE | Admit: 2024-01-26 | Discharge: 2024-01-26 | Disposition: A | Discharge status: Home / Self Care | Source: Ambulatory Visit | Attending: Gastroenterology | Admitting: Gastroenterology

## 2024-01-26 DIAGNOSIS — R7989 Other specified abnormal findings of blood chemistry: Secondary | ICD-10-CM | POA: Insufficient documentation

## 2024-01-26 LAB — COMPREHENSIVE METABOLIC PANEL WITH GFR
ALT: 115 U/L — ABNORMAL HIGH (ref 0–44)
AST: 141 U/L — ABNORMAL HIGH (ref 15–41)
Albumin: 4 g/dL (ref 3.5–5.0)
Alkaline Phosphatase: 135 U/L — ABNORMAL HIGH (ref 38–126)
Anion gap: 8 (ref 5–15)
BUN: 16 mg/dL (ref 8–23)
CO2: 27 mmol/L (ref 22–32)
Calcium: 9.3 mg/dL (ref 8.9–10.3)
Chloride: 103 mmol/L (ref 98–111)
Creatinine, Ser: 1.02 mg/dL — ABNORMAL HIGH (ref 0.44–1.00)
GFR, Estimated: 56 mL/min — ABNORMAL LOW (ref >60)
Glucose, Bld: 96 mg/dL (ref 70–99)
Potassium: 4.3 mmol/L (ref 3.5–5.1)
Sodium: 138 mmol/L (ref 135–145)
Total Bilirubin: 0.7 mg/dL (ref 0.0–1.2)
Total Protein: 7.7 g/dL (ref 6.5–8.1)

## 2024-01-26 LAB — PROTIME-INR
INR: 1 (ref 0.8–1.2)
Prothrombin Time: 13.7 s (ref 11.4–15.2)

## 2024-01-27 ENCOUNTER — Ambulatory Visit: Payer: Self-pay | Admitting: Gastroenterology

## 2024-02-02 ENCOUNTER — Other Ambulatory Visit: Payer: Self-pay | Admitting: Cardiology

## 2024-02-02 ENCOUNTER — Telehealth: Payer: Self-pay | Admitting: Cardiology

## 2024-02-02 NOTE — Telephone Encounter (Signed)
 Pt states she has small red bumps all over her legs that look like tiny blood spots.

## 2024-02-02 NOTE — Telephone Encounter (Signed)
 Patient stated that she has had some small red bumps under skin not really raised. Not itching or in pain. She stated that she noticed them 2 months ago and went to ER, but they couldn't really tell her what it was. She does wear her compression stockings that she was told to wear. She elevates her legs as well. Will use Voltaren gel as well when needed. I advised her that she can call her primary care physician and have them to evaluate her rash. I let her know she was on the recall list someone will call her to get her an appointment. Routed to provider for review

## 2024-02-03 NOTE — Telephone Encounter (Signed)
 Per Dr.Branch:  Rash would be best evaluated by her pcp   JINNY Ross MD  Left detailed message for patient.

## 2024-02-05 ENCOUNTER — Other Ambulatory Visit: Payer: Self-pay | Admitting: Cardiology

## 2024-02-22 ENCOUNTER — Ambulatory Visit: Admitting: Gastroenterology

## 2024-02-22 ENCOUNTER — Inpatient Hospital Stay: Payer: Medicare Other

## 2024-02-28 ENCOUNTER — Encounter: Payer: Self-pay | Admitting: *Deleted

## 2024-02-29 ENCOUNTER — Inpatient Hospital Stay: Payer: Medicare Other | Admitting: Physician Assistant

## 2024-03-04 ENCOUNTER — Other Ambulatory Visit: Payer: Self-pay | Admitting: Family Medicine

## 2024-03-16 ENCOUNTER — Ambulatory Visit: Attending: Cardiology | Admitting: Cardiology

## 2024-03-16 ENCOUNTER — Encounter: Payer: Self-pay | Admitting: Cardiology

## 2024-03-16 VITALS — BP 150/82 | HR 78 | Ht 63.0 in | Wt 148.8 lb

## 2024-03-16 DIAGNOSIS — Z79899 Other long term (current) drug therapy: Secondary | ICD-10-CM

## 2024-03-16 DIAGNOSIS — I6529 Occlusion and stenosis of unspecified carotid artery: Secondary | ICD-10-CM

## 2024-03-16 DIAGNOSIS — I2510 Atherosclerotic heart disease of native coronary artery without angina pectoris: Secondary | ICD-10-CM | POA: Diagnosis not present

## 2024-03-16 DIAGNOSIS — I10 Essential (primary) hypertension: Secondary | ICD-10-CM

## 2024-03-16 MED ORDER — LOSARTAN POTASSIUM 50 MG PO TABS: 50.0000 mg | ORAL_TABLET | Freq: Every day | ORAL | 6 refills | Status: AC

## 2024-03-16 MED ORDER — ROSUVASTATIN CALCIUM 5 MG PO TABS: 5.0000 mg | ORAL_TABLET | Freq: Every day | ORAL | 6 refills | Status: AC

## 2024-03-16 NOTE — Patient Instructions (Signed)
 Medication Instructions:   Stop Atorvastatin  (Lipitor ) Begin Crestor  5mg  daily  Increase Losartan  to 50mg  daily  Continue all other medications.     Labwork:  CMET - order given today Please do in 2 weeks   Testing/Procedures:  Your physician has requested that you have a carotid duplex. This test is an ultrasound of the carotid arteries in your neck. It looks at blood flow through these arteries that supply the brain with blood. Allow one hour for this exam. There are no restrictions or special instructions.  Follow-Up:  Office will contact with results via phone, letter or mychart.    6 months   Any Other Special Instructions Will Be Listed Below (If Applicable).  Nurse BP check in [place] on 05/03/24 - will be at the cancer center already this day  If you need a refill on your cardiac medications before your next appointment, please call your pharmacy.
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
## 2024-03-17 NOTE — Progress Notes (Signed)
 Referral sent for fibroscan and they will contact pt to schedule.

## 2024-03-27 ENCOUNTER — Ambulatory Visit

## 2024-03-30 ENCOUNTER — Telehealth: Payer: Self-pay | Admitting: Gastroenterology

## 2024-03-30 NOTE — Telephone Encounter (Signed)
 Received staff message notification from Lorn Lunger -  referral coordinator that we received notification from [facility] that had reach out to patient multiple times regarding the referral for FibroScan and they had been unable to get in contact with the patient.  They stated that they did not hear for the patient within 6 that a new referral would need to be submitted.   My reply:  Genna. Can you please reach out to her and give her their phone number to call them. You could also make at least one attempt at calling her daughter just to see if she can reach out to her to call us  back (say nothing more than that given her DPR only has leaving a voicemail on her personal voicemail)  and send her a mychart message asking her to call us  or [facility].    Patient has been historically difficult to contact in regards to scheduling different procedures and follow-ups.  Has occasionally responded to [place].  Charmaine Melia, MSN, APRN, FNP-BC, AGACNP-BC [facility]

## 2024-04-05 ENCOUNTER — Other Ambulatory Visit: Payer: Self-pay | Admitting: Family Medicine

## 2024-04-05 ENCOUNTER — Ambulatory Visit: Admitting: Cardiology

## 2024-04-05 ENCOUNTER — Telehealth: Payer: Self-pay | Admitting: *Deleted

## 2024-04-05 NOTE — Telephone Encounter (Signed)
 Pt wanted to let you know that her Fibroscan is scheduled for 05/08/24. FYI

## 2024-04-12 ENCOUNTER — Ambulatory Visit

## 2024-04-18 ENCOUNTER — Ambulatory Visit: Admitting: Gastroenterology

## 2024-05-03 ENCOUNTER — Ambulatory Visit

## 2024-05-03 ENCOUNTER — Inpatient Hospital Stay

## 2024-05-08 ENCOUNTER — Ambulatory Visit: Admitting: Neurology

## 2024-05-10 ENCOUNTER — Inpatient Hospital Stay: Admitting: Physician Assistant

## 2024-11-21 ENCOUNTER — Ambulatory Visit
